# Patient Record
Sex: Female | Born: 1945
Health system: Southern US, Community
[De-identification: ages and names within clinical notes are randomized; demographics above are authoritative.]

## PROBLEM LIST (undated history)

## (undated) DIAGNOSIS — M1990 Unspecified osteoarthritis, unspecified site: Secondary | ICD-10-CM

## (undated) DIAGNOSIS — H269 Unspecified cataract: Secondary | ICD-10-CM

## (undated) DIAGNOSIS — E785 Hyperlipidemia, unspecified: Secondary | ICD-10-CM

## (undated) DIAGNOSIS — K219 Gastro-esophageal reflux disease without esophagitis: Secondary | ICD-10-CM

## (undated) DIAGNOSIS — M810 Age-related osteoporosis without current pathological fracture: Secondary | ICD-10-CM

## (undated) DIAGNOSIS — F410 Panic disorder [episodic paroxysmal anxiety] without agoraphobia: Secondary | ICD-10-CM

## (undated) DIAGNOSIS — I10 Essential (primary) hypertension: Secondary | ICD-10-CM

## (undated) DIAGNOSIS — K7460 Unspecified cirrhosis of liver: Secondary | ICD-10-CM

## (undated) HISTORY — DX: Age-related osteoporosis without current pathological fracture: M81.0

## (undated) HISTORY — PX: COLONOSCOPY: SHX174

## (undated) HISTORY — DX: Panic disorder (episodic paroxysmal anxiety): F41.0

## (undated) HISTORY — DX: Hyperlipidemia, unspecified: E78.5

## (undated) HISTORY — DX: Gastro-esophageal reflux disease without esophagitis: K21.9

## (undated) HISTORY — DX: Unspecified cataract: H26.9

## (undated) HISTORY — DX: Unspecified cirrhosis of liver: K74.60

## (undated) HISTORY — PX: OTHER SURGICAL HISTORY: SHX169

## (undated) HISTORY — DX: Essential (primary) hypertension: I10

## (undated) HISTORY — DX: Unspecified osteoarthritis, unspecified site: M19.90

---

## 1986-02-18 HISTORY — PX: TUBAL LIGATION: SHX77

## 1990-02-18 HISTORY — PX: FRACTURE SURGERY: SHX138

## 1998-08-11 ENCOUNTER — Other Ambulatory Visit: Admission: RE | Admit: 1998-08-11 | Discharge: 1998-08-11 | Payer: Self-pay | Admitting: Obstetrics & Gynecology

## 1999-09-05 ENCOUNTER — Other Ambulatory Visit: Admission: RE | Admit: 1999-09-05 | Discharge: 1999-09-05 | Payer: Self-pay | Admitting: Obstetrics and Gynecology

## 1999-09-11 ENCOUNTER — Ambulatory Visit (HOSPITAL_BASED_OUTPATIENT_CLINIC_OR_DEPARTMENT_OTHER): Admission: RE | Admit: 1999-09-11 | Discharge: 1999-09-11 | Payer: Self-pay | Admitting: Pediatrics

## 2000-09-16 ENCOUNTER — Other Ambulatory Visit: Admission: RE | Admit: 2000-09-16 | Discharge: 2000-09-16 | Payer: Self-pay | Admitting: Obstetrics & Gynecology

## 2001-04-25 ENCOUNTER — Emergency Department (HOSPITAL_COMMUNITY): Admission: EM | Admit: 2001-04-25 | Discharge: 2001-04-25 | Payer: Self-pay | Admitting: Emergency Medicine

## 2001-05-11 ENCOUNTER — Encounter: Admission: RE | Admit: 2001-05-11 | Discharge: 2001-08-09 | Payer: Self-pay | Admitting: Internal Medicine

## 2003-03-06 ENCOUNTER — Emergency Department (HOSPITAL_COMMUNITY): Admission: EM | Admit: 2003-03-06 | Discharge: 2003-03-06 | Payer: Self-pay | Admitting: Emergency Medicine

## 2003-04-15 ENCOUNTER — Other Ambulatory Visit: Admission: RE | Admit: 2003-04-15 | Discharge: 2003-04-15 | Payer: Self-pay | Admitting: Obstetrics & Gynecology

## 2003-08-12 ENCOUNTER — Emergency Department (HOSPITAL_COMMUNITY): Admission: EM | Admit: 2003-08-12 | Discharge: 2003-08-12 | Payer: Self-pay | Admitting: Emergency Medicine

## 2004-02-19 HISTORY — PX: CHOLECYSTECTOMY: SHX55

## 2004-06-12 ENCOUNTER — Ambulatory Visit (HOSPITAL_COMMUNITY): Admission: RE | Admit: 2004-06-12 | Discharge: 2004-06-12 | Payer: Self-pay | Admitting: Internal Medicine

## 2004-06-12 ENCOUNTER — Ambulatory Visit: Payer: Self-pay | Admitting: Internal Medicine

## 2004-08-11 ENCOUNTER — Emergency Department (HOSPITAL_COMMUNITY): Admission: EM | Admit: 2004-08-11 | Discharge: 2004-08-11 | Payer: Self-pay | Admitting: Emergency Medicine

## 2004-09-12 ENCOUNTER — Ambulatory Visit: Payer: Self-pay | Admitting: Internal Medicine

## 2004-09-14 ENCOUNTER — Ambulatory Visit (HOSPITAL_COMMUNITY): Admission: RE | Admit: 2004-09-14 | Discharge: 2004-09-14 | Payer: Self-pay | Admitting: Internal Medicine

## 2004-09-19 ENCOUNTER — Ambulatory Visit: Payer: Self-pay | Admitting: Internal Medicine

## 2005-01-01 ENCOUNTER — Ambulatory Visit: Payer: Self-pay | Admitting: Internal Medicine

## 2005-01-01 ENCOUNTER — Encounter (INDEPENDENT_AMBULATORY_CARE_PROVIDER_SITE_OTHER): Payer: Self-pay | Admitting: Pulmonary Disease

## 2005-03-02 ENCOUNTER — Ambulatory Visit: Payer: Self-pay | Admitting: Cardiology

## 2005-03-02 ENCOUNTER — Observation Stay (HOSPITAL_COMMUNITY): Admission: EM | Admit: 2005-03-02 | Discharge: 2005-03-04 | Payer: Self-pay | Admitting: Family Medicine

## 2005-03-11 ENCOUNTER — Encounter: Payer: Self-pay | Admitting: Cardiology

## 2005-03-11 ENCOUNTER — Ambulatory Visit: Payer: Self-pay

## 2005-03-12 ENCOUNTER — Encounter: Admission: RE | Admit: 2005-03-12 | Discharge: 2005-03-12 | Payer: Self-pay | Admitting: Internal Medicine

## 2005-03-27 ENCOUNTER — Ambulatory Visit (HOSPITAL_COMMUNITY): Admission: RE | Admit: 2005-03-27 | Discharge: 2005-03-27 | Payer: Self-pay | Admitting: *Deleted

## 2005-03-27 ENCOUNTER — Encounter (INDEPENDENT_AMBULATORY_CARE_PROVIDER_SITE_OTHER): Payer: Self-pay | Admitting: *Deleted

## 2005-03-27 ENCOUNTER — Encounter: Payer: Self-pay | Admitting: Internal Medicine

## 2005-04-23 ENCOUNTER — Other Ambulatory Visit: Admission: RE | Admit: 2005-04-23 | Discharge: 2005-04-23 | Payer: Self-pay | Admitting: Obstetrics & Gynecology

## 2005-05-07 ENCOUNTER — Ambulatory Visit (HOSPITAL_COMMUNITY): Admission: RE | Admit: 2005-05-07 | Discharge: 2005-05-08 | Payer: Self-pay | Admitting: General Surgery

## 2005-05-07 ENCOUNTER — Encounter (INDEPENDENT_AMBULATORY_CARE_PROVIDER_SITE_OTHER): Payer: Self-pay | Admitting: *Deleted

## 2005-10-01 ENCOUNTER — Ambulatory Visit (HOSPITAL_COMMUNITY): Admission: RE | Admit: 2005-10-01 | Discharge: 2005-10-01 | Payer: Self-pay | Admitting: Internal Medicine

## 2005-10-01 ENCOUNTER — Encounter (INDEPENDENT_AMBULATORY_CARE_PROVIDER_SITE_OTHER): Payer: Self-pay | Admitting: Pulmonary Disease

## 2005-12-01 LAB — HM MAMMOGRAPHY: HM Mammogram: NORMAL

## 2005-12-28 ENCOUNTER — Encounter (INDEPENDENT_AMBULATORY_CARE_PROVIDER_SITE_OTHER): Payer: Self-pay | Admitting: Pulmonary Disease

## 2005-12-28 DIAGNOSIS — K219 Gastro-esophageal reflux disease without esophagitis: Secondary | ICD-10-CM | POA: Insufficient documentation

## 2005-12-28 DIAGNOSIS — R079 Chest pain, unspecified: Secondary | ICD-10-CM | POA: Insufficient documentation

## 2006-01-01 ENCOUNTER — Encounter (INDEPENDENT_AMBULATORY_CARE_PROVIDER_SITE_OTHER): Payer: Self-pay | Admitting: Pulmonary Disease

## 2006-01-01 LAB — CONVERTED CEMR LAB

## 2006-03-12 DIAGNOSIS — E119 Type 2 diabetes mellitus without complications: Secondary | ICD-10-CM | POA: Insufficient documentation

## 2006-03-12 DIAGNOSIS — E785 Hyperlipidemia, unspecified: Secondary | ICD-10-CM

## 2006-03-12 DIAGNOSIS — E1169 Type 2 diabetes mellitus with other specified complication: Secondary | ICD-10-CM | POA: Insufficient documentation

## 2006-03-12 DIAGNOSIS — I10 Essential (primary) hypertension: Secondary | ICD-10-CM | POA: Insufficient documentation

## 2006-04-21 ENCOUNTER — Emergency Department (HOSPITAL_COMMUNITY): Admission: EM | Admit: 2006-04-21 | Discharge: 2006-04-21 | Payer: Self-pay | Admitting: Family Medicine

## 2007-07-12 ENCOUNTER — Emergency Department (HOSPITAL_COMMUNITY): Admission: EM | Admit: 2007-07-12 | Discharge: 2007-07-12 | Payer: Self-pay | Admitting: Family Medicine

## 2007-11-24 ENCOUNTER — Emergency Department (HOSPITAL_COMMUNITY): Admission: EM | Admit: 2007-11-24 | Discharge: 2007-11-24 | Payer: Self-pay | Admitting: Emergency Medicine

## 2008-05-28 LAB — HM DIABETES EYE EXAM: HM Diabetic Eye Exam: NORMAL

## 2008-07-27 ENCOUNTER — Encounter: Payer: Self-pay | Admitting: Internal Medicine

## 2008-07-29 ENCOUNTER — Encounter: Payer: Self-pay | Admitting: Internal Medicine

## 2008-08-04 ENCOUNTER — Ambulatory Visit (HOSPITAL_COMMUNITY): Admission: RE | Admit: 2008-08-04 | Discharge: 2008-08-04 | Payer: Self-pay | Admitting: Internal Medicine

## 2008-09-02 ENCOUNTER — Encounter: Payer: Self-pay | Admitting: Internal Medicine

## 2008-09-02 ENCOUNTER — Ambulatory Visit: Payer: Self-pay | Admitting: Internal Medicine

## 2008-09-02 DIAGNOSIS — J45909 Unspecified asthma, uncomplicated: Secondary | ICD-10-CM | POA: Insufficient documentation

## 2008-09-02 LAB — CONVERTED CEMR LAB
Blood Glucose, Fingerstick: 163
Creatinine, Urine: 118.9 mg/dL
Hgb A1c MFr Bld: 8.4 %
Microalb Creat Ratio: 6.3 mg/g (ref 0.0–30.0)
Microalb, Ur: 0.75 mg/dL (ref 0.00–1.89)

## 2008-09-09 ENCOUNTER — Encounter: Payer: Self-pay | Admitting: Internal Medicine

## 2008-09-16 ENCOUNTER — Encounter: Payer: Self-pay | Admitting: Internal Medicine

## 2008-09-16 ENCOUNTER — Ambulatory Visit: Payer: Self-pay | Admitting: Internal Medicine

## 2008-09-17 LAB — CONVERTED CEMR LAB
ALT: 33 units/L (ref 0–35)
AST: 27 units/L (ref 0–37)
Albumin: 4.3 g/dL (ref 3.5–5.2)
Alkaline Phosphatase: 57 units/L (ref 39–117)
BUN: 16 mg/dL (ref 6–23)
CO2: 24 meq/L (ref 19–32)
Calcium: 9.8 mg/dL (ref 8.4–10.5)
Chloride: 107 meq/L (ref 96–112)
Cholesterol: 204 mg/dL — ABNORMAL HIGH (ref 0–200)
Creatinine, Ser: 0.8 mg/dL (ref 0.40–1.20)
Glucose, Bld: 134 mg/dL — ABNORMAL HIGH (ref 70–99)
HCT: 36.3 % (ref 36.0–46.0)
HDL: 49 mg/dL (ref >39)
Hemoglobin: 11.9 g/dL — ABNORMAL LOW (ref 12.0–15.0)
LDL Cholesterol: 130 mg/dL — ABNORMAL HIGH (ref 0–99)
MCHC: 32.8 g/dL (ref 30.0–36.0)
MCV: 88.8 fL (ref >=78.0)
Platelets: 231 10*3/uL (ref 150–400)
Potassium: 4.2 meq/L (ref 3.5–5.3)
RBC: 4.09 M/uL (ref 3.87–5.11)
RDW: 14 % (ref 11.5–15.5)
Sodium: 141 meq/L (ref 135–145)
TSH: 2.513 microintl units/mL (ref 0.350–4.5)
Total Bilirubin: 0.3 mg/dL (ref 0.3–1.2)
Total CHOL/HDL Ratio: 4.2
Total Protein: 7.3 g/dL (ref 6.0–8.3)
Triglycerides: 123 mg/dL (ref <150)
VLDL: 25 mg/dL (ref 0–40)
WBC: 5.1 10*3/uL (ref 4.0–10.5)

## 2008-09-19 ENCOUNTER — Telehealth: Payer: Self-pay | Admitting: Internal Medicine

## 2008-09-27 ENCOUNTER — Telehealth: Payer: Self-pay | Admitting: *Deleted

## 2008-10-04 ENCOUNTER — Encounter: Payer: Self-pay | Admitting: Internal Medicine

## 2008-10-27 ENCOUNTER — Ambulatory Visit: Payer: Self-pay | Admitting: Internal Medicine

## 2008-10-27 LAB — CONVERTED CEMR LAB: Blood Glucose, Fingerstick: 212

## 2009-01-27 ENCOUNTER — Ambulatory Visit: Payer: Self-pay | Admitting: Infectious Diseases

## 2009-01-27 DIAGNOSIS — IMO0002 Reserved for concepts with insufficient information to code with codable children: Secondary | ICD-10-CM | POA: Insufficient documentation

## 2009-02-06 ENCOUNTER — Telehealth: Payer: Self-pay | Admitting: Internal Medicine

## 2009-03-22 ENCOUNTER — Telehealth: Payer: Self-pay | Admitting: Internal Medicine

## 2009-03-27 ENCOUNTER — Encounter: Payer: Self-pay | Admitting: Internal Medicine

## 2009-04-27 ENCOUNTER — Telehealth: Payer: Self-pay | Admitting: Internal Medicine

## 2009-05-01 ENCOUNTER — Telehealth: Payer: Self-pay | Admitting: Internal Medicine

## 2009-05-08 ENCOUNTER — Telehealth (INDEPENDENT_AMBULATORY_CARE_PROVIDER_SITE_OTHER): Payer: Self-pay | Admitting: *Deleted

## 2009-05-09 ENCOUNTER — Ambulatory Visit: Payer: Self-pay | Admitting: Internal Medicine

## 2009-05-09 LAB — CONVERTED CEMR LAB
Blood Glucose, Fingerstick: 215
Hgb A1c MFr Bld: 9.4 %

## 2009-05-15 ENCOUNTER — Ambulatory Visit: Payer: Self-pay | Admitting: Infectious Diseases

## 2009-05-15 LAB — CONVERTED CEMR LAB: Blood Glucose, Home Monitor: 1 mg/dL

## 2009-05-18 ENCOUNTER — Telehealth (INDEPENDENT_AMBULATORY_CARE_PROVIDER_SITE_OTHER): Payer: Self-pay | Admitting: *Deleted

## 2009-06-12 ENCOUNTER — Ambulatory Visit: Payer: Self-pay | Admitting: Internal Medicine

## 2009-06-12 LAB — HM DIABETES FOOT EXAM

## 2009-06-21 ENCOUNTER — Telehealth: Payer: Self-pay | Admitting: Internal Medicine

## 2009-06-22 ENCOUNTER — Ambulatory Visit: Payer: Self-pay | Admitting: Internal Medicine

## 2009-06-22 DIAGNOSIS — J40 Bronchitis, not specified as acute or chronic: Secondary | ICD-10-CM

## 2009-06-22 DIAGNOSIS — J209 Acute bronchitis, unspecified: Secondary | ICD-10-CM | POA: Insufficient documentation

## 2009-06-22 HISTORY — DX: Bronchitis, not specified as acute or chronic: J40

## 2009-06-22 LAB — CONVERTED CEMR LAB: Blood Glucose, Fingerstick: 251

## 2009-06-30 ENCOUNTER — Telehealth (INDEPENDENT_AMBULATORY_CARE_PROVIDER_SITE_OTHER): Payer: Self-pay | Admitting: *Deleted

## 2009-07-05 ENCOUNTER — Telehealth: Payer: Self-pay | Admitting: *Deleted

## 2009-07-10 ENCOUNTER — Telehealth: Payer: Self-pay | Admitting: Internal Medicine

## 2009-07-19 ENCOUNTER — Telehealth: Payer: Self-pay | Admitting: Internal Medicine

## 2009-08-08 ENCOUNTER — Telehealth: Payer: Self-pay | Admitting: Internal Medicine

## 2009-11-16 ENCOUNTER — Telehealth: Payer: Self-pay | Admitting: Internal Medicine

## 2009-11-24 ENCOUNTER — Encounter: Admission: RE | Admit: 2009-11-24 | Discharge: 2009-11-24 | Payer: Self-pay | Admitting: Internal Medicine

## 2010-03-11 ENCOUNTER — Encounter: Payer: Self-pay | Admitting: Internal Medicine

## 2010-03-19 ENCOUNTER — Telehealth (INDEPENDENT_AMBULATORY_CARE_PROVIDER_SITE_OTHER): Payer: Self-pay | Admitting: *Deleted

## 2010-03-20 NOTE — Miscellaneous (Signed)
Summary: vaccine record  vaccine record   Imported By: Harrison Mons Steps 02/24/2006 17:58:16  _____________________________________________________________________  External Attachment:    Type:   Image     Comment:   External Document

## 2010-03-20 NOTE — Progress Notes (Signed)
Summary: congestion/ hla  Phone Note Call from Patient   Summary of Call: pt calls c/o congestion and sinus pain x 1 wk, short of breath at times, using inhaler helps somewhat nothing seems to make worse. denies fever. able to take food and fluid w/o difficulty. desires appt, given for 5/5. pt is instructed to go to ed if condition becomes worse. she is agreeable Initial call taken by: Marin Roberts RN,  Jun 21, 2009 3:37 PM  Follow-up for Phone Call        OK..Will follow up. Follow-up by: Blondell Reveal MD,  Jun 22, 2009 8:42 AM

## 2010-03-20 NOTE — Assessment & Plan Note (Signed)
Summary: ACUTE-RIGHT FOOT/BIG TOE HAS A LITTLE SPLIT ON IT/CFB(KARIMOV...   Vital Signs:  Patient profile:   65 year old female Height:      68 inches (172.72 cm) Weight:      1880.7 pounds (82.41 kg) BMI:     286.99 Temp:     97.1 degrees F (36.17 degrees C) oral Pulse (ortho):   87 / minute BP sitting:   136 / 89  (right arm) Cuff size:   regular  Vitals Entered By: Theotis Barrio NT II (January 27, 2009 3:54 PM) CC: SPOT ON RIGHT GREAT TOE   / DM Is Patient Diabetic? Yes Did you bring your meter with you today? No Pain Assessment Patient in pain? no       Have you ever been in a relationship where you felt threatened, hurt or afraid?No   Does patient need assistance? Functional Status Self care Ambulation Normal Comments DM / SPOT ON RIGHT GREAT TOE  / DM   Primary Care Provider:  Deatra Robinson MD  CC:  SPOT ON RIGHT GREAT TOE   / DM.  History of Present Illness: 65 yrs old female with HTN and DM here for visit after noticing blister in her great toe on left feet. She does not report any discharge, local swelling, pain.  She did not tolerate high dose of metformin = she felt dizzy and grogy. She went back to her previous dose and done well since. She has no other complain.   Preventive Screening-Counseling & Management  Alcohol-Tobacco     Alcohol drinks/day: 0     Smoking Status: quit     Smoking Cessation Counseling: yes     Packs/Day: 4 cig/week     Year Quit: 1970  Caffeine-Diet-Exercise     Caffeine use/day: cups of coffee     Nutrition Referrals: yes     Does Patient Exercise: yes     Type of exercise: walking     Exercise (avg: min/session): 30-60     Times/week: 5  Current Medications (verified): 1)  Proventil Hfa 108 (90 Base) Mcg/act Aers (Albuterol Sulfate) .... Qid As Needed 2)  Advair Hfa 45-21 Mcg/act Aero (Fluticasone-Salmeterol) .... One Puff Daily 3)  Metformin Hcl 500 Mg Tabs (Metformin Hcl) .... Take 1 Tablet By Mouth Two Times A  Day 4)  Aspir-Low 81 Mg Tbec (Aspirin) .... Take 1 Tablet By Mouth Once A Day With Meals For Heart Protection 5)  Onetouch Ultra System W/device Kit (Blood Glucose Monitoring Suppl) .... Check Blood Sugar Twice A Day Before Meals 6)  Glimepiride 4 Mg Tabs (Glimepiride) .... Take 1 Tablet By Mouth Two Times A Day 7)  Cvs Omeprazole 20 Mg Tbec (Omeprazole) .... Take 1 Tablet By Mouth Two Times A Day Before Meals 8)  Hydrochlorothiazide 25 Mg Tabs (Hydrochlorothiazide) .... Take 1 Tablet By Mouth Once A Day in Am 9)  Pravastatin Sodium 20 Mg Tabs (Pravastatin Sodium) .... Take 1 Tablet By Mouth Once A Day  Allergies (verified): No Known Drug Allergies  Past History:  Past Medical History: Last updated: September 19, 2008 GERD-remote history/ add protonix for cough. Chest pain- normal EKG Asthma, moderate Diabetes mellitus, type II Hypertension Hyperlipidemia  Past Surgical History: Last updated: 2008/09/19 Cholecystectomy in 2006 BTL in 1988 C-section in 1988 L ankle ORIF in 1992  Family History: Last updated: 09-19-2008 Father died from CAD at age of 26 y/o Mother died from Lung cancer at age 40 y/o  Social History: Last updated: 09/16/2008 Married, 5  children Unemployed Uninsured --does not qualify for an "[employer]' benefits. Education: 1 year of college ETOH: no Nikotine: 5 cig/week for 1 year. Quit in 1970 Drugs: no  Risk Factors: Alcohol Use: 0 (01/27/2009) Caffeine Use: cups of coffee (01/27/2009) Exercise: yes (01/27/2009)  Risk Factors: Smoking Status: quit (01/27/2009) Packs/Day: 4 cig/week (01/27/2009)  Review of Systems      See HPI  Physical Exam  General:  alert.   Head:  no abnormalities observed.   Eyes:  pupils equal, pupils round, pupils reactive to light, and pupils react to accomodation.   Ears:  R ear normal and L ear normal.   Nose:  no external deformity, no airflow obstruction, no intranasal foreign body, nasal discharge, mucosal erythema,  and mucosal edema.   Mouth:  pharyngeal erythema.   Neck:  supple and no masses.   Lungs:  normal respiratory effort and normal breath sounds.   Heart:  normal rate, regular rhythm, and no murmur.   Abdomen:  soft and normal bowel sounds.   Msk:  No deformity or scoliosis noted of thoracic or lumbar spine.   Extremities:  small area of skin denudation without any discharge on right great toe base.  Neurologic:  No cranial nerve deficits noted. Station and gait are normal. Plantar reflexes are down-going bilaterally. DTRs are symmetrical throughout. Sensory, motor and coordinative functions appear intact. Psych:  Cognition and judgment appear intact. Alert and cooperative with normal attention span and concentration. No apparent delusions, illusions, hallucinations   Impression & Recommendations:  Problem # 1:  FOOT&TOE BLISTER WITHOUT MENTION OF INFECTION (ICD-917.2) advised moisture keeping socks, regular moisturiser and petroleum jelly use. Appears non infected therefore no antibiotics given.  She started using new shoes, which may not have enough area for toe accomodation and result into abrasion. I advised her on proper foot ware.   Problem # 2:  HYPERLIPIDEMIA (ICD-272.4) stable.no changes made at this time.  Her updated medication list for this problem includes:    Pravastatin Sodium 20 Mg Tabs (Pravastatin sodium) .Marland Kitchen... Take 1 tablet by mouth once a day  Labs Reviewed: SGOT: 27 (09/16/2008)   SGPT: 33 (09/16/2008)   HDL:49 (09/16/2008)  LDL:130 (09/16/2008)  Chol:204 (09/16/2008)  Trig:123 (09/16/2008)  Problem # 3:  DIABETES MELLITUS, TYPE II (ICD-250.00) could not tolerate metformin 850. NOw tolerating 500 two times a day. May need to go on insulin. Discussed this in detail with her and she is NOT resistant to going on insulin At this time we have set up goal to have HbA1C less then 7 or daily CBG <150 by Jan end. Otherwise, she is ready to go insulin. I will flag her PCP for the  same.  p. s. she could not tolerate higher dose metformin and went to lower dose.  Her updated medication list for this problem includes:    Metformin Hcl 500 Mg Tabs (Metformin hcl) .Marland Kitchen... Take 1 tablet by mouth two times a day    Aspir-low 81 Mg Tbec (Aspirin) .Marland Kitchen... Take 1 tablet by mouth once a day with meals for heart protection    Glimepiride 4 Mg Tabs (Glimepiride) .Marland Kitchen... Take 1 tablet by mouth two times a day  Orders: T- Capillary Blood Glucose ([address]) T-Hgb A1C (in-house) (60454UJ)  Labs Reviewed: Creat: 0.80 (09/16/2008)     Last Eye Exam: normal (06/03/2008) Reviewed HgBA1c results: 8.4 (09/02/2008)  Complete Medication List: 1)  Proventil Hfa 108 (90 Base) Mcg/act Aers (Albuterol sulfate) .... Qid as needed 2)  Advair  Hfa 45-21 Mcg/act Aero (Fluticasone-salmeterol) .... One puff daily 3)  Metformin Hcl 500 Mg Tabs (Metformin hcl) .... Take 1 tablet by mouth two times a day 4)  Aspir-low 81 Mg Tbec (Aspirin) .... Take 1 tablet by mouth once a day with meals for heart protection 5)  Onetouch Ultra System W/device Kit (Blood glucose monitoring suppl) .... Check blood sugar twice a day before meals 6)  Glimepiride 4 Mg Tabs (Glimepiride) .... Take 1 tablet by mouth two times a day 7)  Cvs Omeprazole 20 Mg Tbec (Omeprazole) .... Take 1 tablet by mouth two times a day before meals 8)  Hydrochlorothiazide 25 Mg Tabs (Hydrochlorothiazide) .... Take 1 tablet by mouth once a day in am 9)  Pravastatin Sodium 20 Mg Tabs (Pravastatin sodium) .... Take 1 tablet by mouth once a day  Patient Instructions: 1)  Proper shoe wear, moisturiser and petroleum gel application with moisture keeping socks are key to your foot health.  2)  Diet exercise and life style changes can help control diabetes. If you dont acheive your goals by January we will consider starting you on insulin. Prescriptions: METFORMIN HCL 500 MG TABS (METFORMIN HCL) Take 1 tablet by mouth two times a day  #60 x 0   Entered and  Authorized by:   Clerance Lav MD   Signed by:   Clerance Lav MD on 01/27/2009   Method used:   Electronically to        [facility] (retail)       [address]       [place], [place]  [address]       Ph: [phone]       Fax: [phone]   RxID:   [id number]    Prevention & Chronic Care Immunizations   Influenza vaccine: Not documented   Influenza vaccine deferral: Deferred  (09/16/2008)    Tetanus booster: 09/02/2008: Td   Tetanus booster due: 09/03/2018    Pneumococcal vaccine: Not documented    H. zoster vaccine: Not documented   H. zoster vaccine deferral: Deferred  (09/16/2008)  Colorectal Screening   Hemoccult: Not documented   Hemoccult action/deferral: Ordered  (09/02/2008)    Colonoscopy: Not documented   Colonoscopy action/deferral: Deferred  (09/16/2008)  Other Screening   Pap smear: Refused  (01/01/2006)   Pap smear action/deferral: Refused  (09/16/2008)   Pap smear due: 09/03/2011    Mammogram: Normal  (12/01/2005)   Mammogram action/deferral: Ordered  (09/02/2008)   Mammogram due: 12/02/2006    DXA bone density scan: Not documented   DXA bone density action/deferral: Ordered  (09/02/2008)   DXA scan due: 08/2010    Smoking status: quit  (01/27/2009)  Diabetes Mellitus   HgbA1C: 8.4  (09/02/2008)    Eye exam: normal  (06/03/2008)   Diabetic eye exam action/deferral: Not indicated  (10/27/2008)   Eye exam due: 06/2009    Foot exam: yes  (09/02/2008)   Foot exam action/deferral: Do today   High risk foot: Not documented   Foot care education: Done  (09/02/2008)    Urine microalbumin/creatinine ratio: 6.3  (09/02/2008)    Diabetes flowsheet reviewed?: Yes   Progress toward A1C goal: Unchanged  Lipids   Total Cholesterol: 204  (09/16/2008)   Lipid panel action/deferral: Lipid Panel ordered   LDL: 130  (09/16/2008)   LDL Direct: Not documented   HDL: 49  (09/16/2008)   Triglycerides: 123   (09/16/2008)  SGOT (AST): 27  (09/16/2008)   BMP action: Ordered   SGPT (ALT): 33  (09/16/2008)   Alkaline phosphatase: 57  (09/16/2008)   Total bilirubin: 0.3  (09/16/2008)    Lipid flowsheet reviewed?: Yes   Progress toward LDL goal: At goal  Hypertension   Last Blood Pressure: 136 / 89  (01/27/2009)   Serum creatinine: 0.80  (09/16/2008)   BMP action: Ordered   Serum potassium 4.2  (09/16/2008)   Basic metabolic panel due: 10/17/2008    Hypertension flowsheet reviewed?: Yes   Progress toward BP goal: At goal  Self-Management Support :   Personal Goals (by the next clinic visit) :     Personal A1C goal: 7  (09/16/2008)     Personal blood pressure goal: 130/80  (09/16/2008)     Personal LDL goal: 100  (09/16/2008)    Patient will work on the following items until the next clinic visit to reach self-care goals:     Medications and monitoring: check my blood sugar, check my blood pressure  (01/27/2009)     Eating: use fresh or frozen vegetables, eat foods that are low in salt, eat baked foods instead of fried foods  (01/27/2009)     Activity: take a 30 minute walk every day  (09/16/2008)     Other: attend a self-management class  (09/16/2008)     Home glucose monitoring frequency: 1 time daily  (09/16/2008)    Diabetes self-management support: Written self-care plan  (01/27/2009)   Diabetes care plan printed    Hypertension self-management support: Written self-care plan  (01/27/2009)   Hypertension self-care plan printed.    Lipid self-management support: Written self-care plan  (01/27/2009)   Lipid self-care plan printed.

## 2010-03-20 NOTE — Progress Notes (Signed)
Summary: refill/gg  Phone Note Refill Request  on April 27, 2009 11:07 AM  Refills Requested: Medication #1:  METFORMIN HCL 500 MG TABS Take 1 tablet by mouth two times a day   Last Refilled: 03/22/2009  Method Requested: Electronic Initial call taken by: Merrie Roof RN,  April 27, 2009 11:08 AM  Follow-up for Phone Call        Rx denied because needs a follow up office visit. Follow-up by: Deatra Robinson, MD     Appended Document: refill/gg P{t has a scheduled appointment with you on 4/25.  Will you give her enough meds to last until then?

## 2010-03-20 NOTE — Miscellaneous (Signed)
Summary: HIPAA Restrictions  HIPAA Restrictions   Imported By: Florinda Marker 09/05/2008 15:53:26  _____________________________________________________________________  External Attachment:    Type:   Image     Comment:   External Document

## 2010-03-20 NOTE — Progress Notes (Signed)
Summary: med refill/gp  Phone Note Refill Request Message from:  Fax from Pharmacy on August 08, 2009 11:10 AM  Refills Requested: Medication #1:  GLIMEPIRIDE 4 MG TABS Take 1 tablet by mouth two times a day [BMN]   Last Refilled: 07/05/2009 Last appt.06/22/09.   Method Requested: Electronic Initial call taken by: Chinita Pester RN,  August 08, 2009 11:10 AM  Follow-up for Phone Call        Refill approved-nurse to complete Follow-up by: Deatra Robinson MD,  August 08, 2009 6:59 PM    Prescriptions: GLIMEPIRIDE 4 MG TABS (GLIMEPIRIDE) Take 1 tablet by mouth two times a day Brand medically necessary #60 x 11   Entered and Authorized by:   Deatra Robinson MD   Signed by:   Deatra Robinson MD on 08/08/2009   Method used:   Electronically to        [facility] (retail)       [address]       [place], [place]  [address]       Ph: [phone]       Fax: [phone]   RxID:   [id number]

## 2010-03-20 NOTE — Progress Notes (Signed)
Summary: refill/gg  Phone Note Refill Request  on Jul 10, 2009 10:13 AM  Refills Requested: Medication #1:  LANTUS 100 UNIT/ML SOLN Take 14 units at bedtime ***Pt now wants lantus solostar pen....please change.  Insurance will pay for pens. call when done [phone]   Method Requested: Electronic Initial call taken by: Merrie Roof RN,  Jul 10, 2009 10:13 AM  Follow-up for Phone Call        Rx completed in Dr. Tiajuana Amass Follow-up by: Deatra Robinson MD,  Jul 11, 2009 3:57 PM  Additional Follow-up for Phone Call Additional follow up Details #1::        Rx faxed to pharmacy    Prescriptions: LANTUS 100 UNIT/ML SOLN (INSULIN GLARGINE) Take 14 units at bedtime  #1 x 11   Entered and Authorized by:   Deatra Robinson MD   Signed by:   Deatra Robinson MD on 07/18/2009   Method used:   Electronically to        [facility] (retail)       [address]       [place], [place]  [address]       Ph: [phone]       Fax: [phone]   RxID:   [id number]

## 2010-03-20 NOTE — Assessment & Plan Note (Signed)
Summary: EST-CK/FU/MEDS/CFB   Vital Signs:  Patient profile:   65 year old female Weight:      179.2 pounds (81.45 kg) Temp:     97.1 degrees F (36.17 degrees C) oral Pulse rate:   96 / minute BP sitting:   145 / 93  (right arm) Cuff size:   regular  Vitals Entered By: Cynda Familia Duncan Dull) (June 12, 2009 4:24 PM) CC: f/u after starting lantus Is Patient Diabetic? Yes Did you bring your meter with you today? Yes-pt brings in 2 different meters today, one with older values that she states is not hers  Have you ever been in a relationship where you felt threatened, hurt or afraid?No   Does patient need assistance? Functional Status Self care Ambulation Normal   CC:  f/u after starting lantus.  Preventive Screening-Counseling & Management  Alcohol-Tobacco     Alcohol drinks/day: 0     Smoking Status: quit     Smoking Cessation Counseling: yes     Packs/Day: 4 cig/week     Year Quit: 1970  Allergies: No Known Drug Allergies  Physical Exam  General:  Well-developed,well-nourished,in no acute distress; alert,appropriate and cooperative throughout examination Head:  no abnormalities observed.   Eyes:  anicteric, PERRL.  Ears:  R ear normal and L ear normal.   Nose:  no external deformity, no airflow obstruction, no intranasal foreign body, nasal discharge, mucosal erythema, and mucosal edema.   Mouth:  MMM Neck:  supple and no masses.   Lungs:  Normal respiratory effort, chest expands symmetrically. Lungs are clear to auscultation, no crackles or wheezes. Heart:  Normal rate and regular rhythm. S1 and S2 normal without gallop, murmur, click, rub or other extra sounds. Abdomen:  +BS's, soft, NT and ND.   Msk:  No deformity or scoliosis noted of thoracic or lumbar spine.   Pulses:  Pedal pulses 2+/4 bilaterally Extremities:  no peripheral edema.  Neurologic:  gait is normal.  Diabetes Management Exam:    Foot Exam (with socks and/or shoes not present):    Sensory-Pinprick/Light touch:          Left medial foot (L-4): normal          Left dorsal foot (L-5): normal          Left lateral foot (S-1): normal          Right medial foot (L-4): normal          Right dorsal foot (L-5): normal          Right lateral foot (S-1): normal       Sensory-Monofilament:          Left foot: normal          Right foot: normal       Inspection:          Left foot: normal          Right foot: normal       Nails:          Left foot: normal          Right foot: normal   Impression & Recommendations:  Problem # 1:  DIABETES MELLITUS, TYPE II (ICD-250.00) Diet, exercise discussed. Continue to f/u with Ms. Rhiley with DME. Her updated medication list for this problem includes:    Metformin Hcl 1000 Mg Tabs (Metformin hcl) .Marland Kitchen... Take 1 tablet by mouth two times a day with meals    Aspir-low 81 Mg Tbec (Aspirin) .Marland KitchenMarland KitchenMarland KitchenMarland Kitchen  Take 1 tablet by mouth once a day with meals for heart protection    Glimepiride 4 Mg Tabs (Glimepiride) .Marland Kitchen... Take 1 tablet by mouth two times a day    Lantus 100 Unit/ml Soln (Insulin glargine) .Marland Kitchen... Take 14 units at bedtime  Problem # 2:  HYPERTENSION, ESSENTIAL NOS (ICD-401.9)  Her updated medication list for this problem includes:    Hydrochlorothiazide 25 Mg Tabs (Hydrochlorothiazide) .Marland Kitchen... Take 1 tablet by mouth once a day in am  BP today: 145/93 Prior BP: 132/95 (05/09/2009)  Labs Reviewed: K+: 4.2 (09/16/2008) Creat: : 0.80 (09/16/2008)   Chol: 204 (09/16/2008)   HDL: 49 (09/16/2008)   LDL: 130 (09/16/2008)   TG: 123 (09/16/2008)  Complete Medication List: 1)  Proventil Hfa 108 (90 Base) Mcg/act Aers (Albuterol sulfate) .... Qid as needed 2)  Metformin Hcl 1000 Mg Tabs (Metformin hcl) .... Take 1 tablet by mouth two times a day with meals 3)  Aspir-low 81 Mg Tbec (Aspirin) .... Take 1 tablet by mouth once a day with meals for heart protection 4)  Onetouch Ultra System W/device Kit (Blood glucose monitoring suppl) .... Check  blood sugar twice a day before meals 5)  Glimepiride 4 Mg Tabs (Glimepiride) .... Take 1 tablet by mouth two times a day 6)  Hydrochlorothiazide 25 Mg Tabs (Hydrochlorothiazide) .... Take 1 tablet by mouth once a day in am 7)  Pravastatin Sodium 20 Mg Tabs (Pravastatin sodium) .... Take 1 tab by mouth at bedtime 8)  Lantus 100 Unit/ml Soln (Insulin glargine) .... Take 14 units at bedtime  Other Orders: T-Hemoccult Card-Multiple (take home) ([address]) Mammogram (Screening) (Mammo) Prescriptions: METFORMIN HCL 1000 MG TABS (METFORMIN HCL) Take 1 tablet by mouth two times a day with meals  #60 x 3   Entered and Authorized by:   Deatra Robinson MD   Signed by:   Deatra Robinson MD on 06/12/2009   Method used:   Electronically to        [facility] (retail)       [address]       [place], [place]  [address]       Ph: [phone]       Fax: [phone]   RxID:   [id number]    Prevention & Chronic Care Immunizations   Influenza vaccine: Not documented   Influenza vaccine deferral: Deferred  (09/16/2008)   Influenza vaccine due: 10/19/2009    Tetanus booster: 09/02/2008: Td   Tetanus booster due: 09/03/2018    Pneumococcal vaccine: Not documented   Pneumococcal vaccine deferral: Not indicated  (06/12/2009)    H. zoster vaccine: Not documented   H. zoster vaccine deferral: Not available  (06/12/2009)  Colorectal Screening   Hemoccult: Not documented   Hemoccult action/deferral: Ordered  (06/12/2009)    Colonoscopy: Not documented   Colonoscopy action/deferral: Refused  (06/12/2009)  Other Screening   Pap smear: Refused  (01/01/2006)   Pap smear action/deferral: Refused  (09/16/2008)   Pap smear due: 09/03/2011    Mammogram: Normal  (12/01/2005)   Mammogram action/deferral: Ordered  (06/12/2009)   Mammogram due: 12/02/2006    DXA bone density scan: Not documented   DXA bone density action/deferral: Ordered  (09/02/2008)   DXA scan  due: 08/2010    Smoking status: quit  (06/12/2009)  Diabetes Mellitus   HgbA1C: 9.4  (05/09/2009)    Eye exam: normal  (06/03/2008)   Diabetic eye exam action/deferral: Not indicated  (  10/27/2008)   Eye exam due: 06/2009    Foot exam: yes  (06/12/2009)   Foot exam action/deferral: Do today   High risk foot: Not documented   Foot care education: Done  (09/02/2008)    Urine microalbumin/creatinine ratio: 6.3  (09/02/2008)  Lipids   Total Cholesterol: 204  (09/16/2008)   Lipid panel action/deferral: Lipid Panel ordered   LDL: 130  (09/16/2008)   LDL Direct: Not documented   HDL: 49  (09/16/2008)   Triglycerides: 123  (09/16/2008)    SGOT (AST): 27  (09/16/2008)   BMP action: Ordered   SGPT (ALT): 33  (09/16/2008)   Alkaline phosphatase: 57  (09/16/2008)   Total bilirubin: 0.3  (09/16/2008)    Lipid flowsheet reviewed?: Yes   Progress toward LDL goal: Unchanged    Stage of readiness to change (lipid management): Maintenance  Hypertension   Last Blood Pressure: 145 / 93  (06/12/2009)   Serum creatinine: 0.80  (09/16/2008)   BMP action: Ordered   Serum potassium 4.2  (09/16/2008)   Basic metabolic panel due: 10/17/2008    Hypertension flowsheet reviewed?: Yes   Progress toward BP goal: Deteriorated    Stage of readiness to change (hypertension management): Maintenance  Self-Management Support :   Personal Goals (by the next clinic visit) :     Personal A1C goal: 7  (09/16/2008)     Personal blood pressure goal: 130/80  (09/16/2008)     Personal LDL goal: 100  (09/16/2008)    Patient will work on the following items until the next clinic visit to reach self-care goals:     Medications and monitoring: take my medicines every day, check my blood sugar, check my blood pressure, bring all of my medications to every visit, examine my feet every day  (06/12/2009)     Eating: drink diet soda or water instead of juice or soda, eat more vegetables, use fresh or frozen  vegetables, eat foods that are low in salt, eat baked foods instead of fried foods, eat fruit for snacks and desserts, limit or avoid alcohol  (05/09/2009)     Activity: take a 30 minute walk every day, take the stairs instead of the elevator, park at the far end of the parking lot  (05/09/2009)     Other: attend a self-management class  (09/16/2008)     Home glucose monitoring frequency: 1 time daily  (09/16/2008)    Diabetes self-management support: Written self-care plan  (06/12/2009)   Diabetes care plan printed   Last diabetes self-management training by diabetes educator: 05/15/2009    Hypertension self-management support: Written self-care plan  (06/12/2009)   Hypertension self-care plan printed.    Lipid self-management support: Written self-care plan  (06/12/2009)   Lipid self-care plan printed.   Nursing Instructions: Provide Hemoccult cards with instructions (see order) Schedule screening mammogram (see order)

## 2010-03-20 NOTE — Progress Notes (Signed)
Summary: refill/gg  Phone Note Refill Request  on July 19, 2009 10:17 AM  ****Please read my note,  pt wants lantus solostar PEN's   Method Requested: Electronic Initial call taken by: Merrie Roof RN,  July 19, 2009 10:17 AM    New/Updated Medications: LANTUS SOLOSTAR 100 UNIT/ML SOLN (INSULIN GLARGINE) Inject 14 Units Subcutaneously qhs Prescriptions: LANTUS SOLOSTAR 100 UNIT/ML SOLN (INSULIN GLARGINE) Inject 14 Units Subcutaneously qhs  #1 x 5   Entered and Authorized by:   Deatra Robinson MD   Signed by:   Deatra Robinson MD on 07/19/2009   Method used:   Electronically to        [facility] (retail)       [address]       [place], [place]  [address]       Ph: [phone]       Fax: [phone]   RxID:   [id number]

## 2010-03-20 NOTE — Assessment & Plan Note (Signed)
Summary: DM TRAINING/VS   Allergies: No Known Drug Allergies   Complete Medication List: 1)  Proventil Hfa 108 (90 Base) Mcg/act Aers (Albuterol sulfate) .... Qid as needed 2)  Metformin Hcl 500 Mg Tabs (Metformin hcl) .... Take 1 tablet by mouth twice a day. 3)  Aspir-low 81 Mg Tbec (Aspirin) .... Take 1 tablet by mouth once a day with meals for heart protection 4)  Onetouch Ultra System W/device Kit (Blood glucose monitoring suppl) .... Check blood sugar twice a day before meals 5)  Glimepiride 4 Mg Tabs (Glimepiride) .... Take 1 tablet by mouth two times a day 6)  Hydrochlorothiazide 25 Mg Tabs (Hydrochlorothiazide) .... Take 1 tablet by mouth once a day in am 7)  Pravastatin Sodium 20 Mg Tabs (Pravastatin sodium) .... Take 1 tab by mouth at bedtime 8)  Lantus 100 Unit/ml Soln (Insulin glargine) .... Take 8 units at bedtime for 3 nights and then increase 1 unit each night until target fasting blood sugar reached for 2 out of 3 readings  Other Orders: DSMT(Medicare) Individual, 30 Minutes (G6440)  Diabetes Self Management Training  PCP: Deatra Robinson MD Date diagnosed with diabetes: 02/19/2004 Diabetes Type: Type 2 insulin Other persons present: Spouse Current smoking Status: quit  Assessment Daily activities: stopped keeping grandchild so started walking and is sore Sources of Support: husband here and very supportive,  Knowledge Deficits: has some basic diabetes knowledge, but needs confidence and review to pul it together for her to actively self manage Special needs or Barriers: self pay may be a barrier  Potential Barriers  Economic/Supplies  [place] strain  Diabetes Medications:  Comments: educated patient and husband on action,storage and use of lantus insulin pen, sample given per Dr. Andrey Campanile and attending physician today with starter sample of pen needles. Patient verbalized understanding of self- titration, cost of insulin and supplies, and how to seek patient  assistance with cost. patient reports lowest CBG has been 187 today and that is the lowest she has been able to get it.  Long Acting  Insulin Type:Lantus  Bedtime Dose: 8 units    Monitoring Self monitoring blood glucose 1 time a day  Time of Testing  Before Breakfast  Recent Episodes of: Requiring Help from another person  Hyperglycemia : Yes      Nutrition assessment Weight change: Loss Amount of change: 5 pounds recently by tryng to cut back Do you read food labels?                                                                          Yes What do you look at?  carbs, but wasn't sure how to quantify or know portion sizes  Activity Limitations  Inadequate physical activity  Appropriate physical activity Length of time: recently started walking again Diabetes Disease Process  Discussed today Define diabetes in simple terms: Needs review/assistanceState own type of diabetes: Demonstrates competencyState diabetes is treated by meal plan-exercise-medication-monitoring-education: Demonstrates competency Medications State name-action-dose-duration-side effects-and time to take medication: Demonstrates competency   State appropriate timing of food related to medication: Demonstrates competency   Demonstrates/verbalizes site selection and rotation for injections Demonstrates competency   Correctly draw up and administer insulin-Byetta-Symlin-glucagon: Demonstrates competency    Nutritional Management Identify what foods most often affect blood glucose: Demonstrates competency    Verbalize importance of controlling food portions: Needs review/assistance   State importance of spacing and not omitting meals and snacks: Needs review/assistance    Monitoring State purpose and frequency of monitoring BG-ketones-HgbA1C  : Needs review/assistance   Perform glucose  monitoring/ketone testing and record results correctly: Demonstrates competencyState target blood glucose and HgbA1C goals: Needs review/assistance    Complications State the causes-signs and symptoms and prevention of Hyperglycemia: Needs review/assistance   Explain proper treatment of hyperglycemia: Needs review/assistance   State the causes- signs and symptoms and prevention of hypoglycemia: Needs review/assistanceExplain proper treatment of hypoglycemia: Needs review/assistance Exercise Diabetes Management Education Done: 05/15/2009    BEHAVIORAL GOALS INITIAL Utilizing medications if for therapeutic effectiveness: inject insulin tonight and for the next 3 nights same amount, then begin increasing dose by 1 unit each night.        Diabetes Self Management Support:husbnad is very supportive and clinic staff Follow-up:is not insured or have [place] coverage, so CDE visits are an additional charge If patient desires- she could follow-up her Diabetes Training with pharm D at [facility]

## 2010-03-20 NOTE — Progress Notes (Signed)
Summary: diabetes support/dmr  Phone Note Call from Patient Call back at Home Phone [phone]   Caller: Patient Summary of Call: started insulin this week, doesn't feel bad, but sugar went dropped lower than she expected. From 150  this am to 115mg  at lunch around noon.  returned call: encouraing her to continue insulin and not to overtreat symptoms of low, but to take some carbohydrate.  Initial call taken by: Jamison Neighbor RD,CDE,  May 18, 2009 3:23 PM

## 2010-03-20 NOTE — Progress Notes (Signed)
Summary: High sugars, dizzy  Phone Note Call from Patient   Caller: Patient Call For: Deatra Robinson MD Summary of Call: Call from pt says that her sugars are in the 200's.  For 2 days pain in left temple.  Feeling dizzy since yesterday.  Felt that something is pulling to the left side.  Took sugar at bedtime was 156.  This morning it was 213.  Feeling dizzy when she gets up or looks down and gets back.  Has had some sinus drainage.  Has some sinus drainage down her throat.  No fevers.  No chills.  Some nausea yesterday from head spinning. Angelina Ok RN  February 06, 2009 9:13 AM  Initial call taken by: Angelina Ok RN,  February 06, 2009 9:13 AM  Follow-up for Phone Call        Called Ms. Mikulski to clarify.  She states that she has had some dizziness with the "room spinning" this morning when she got up out of bed and sat up after bending over to tie her shoe.  The vertigo symptoms lasted 10-15 seconds each time and resolved spontaneously.  She has had no other symptoms of dizziness or vertigo.  She notes a new sinus infection for which she is taking sudifed.  She admits to not following her diet and exercise plan recently.  She also admits to having dry mucous membranes and skin.  She feels she may be dehydrated and I would agree with this assessment given what I have been told.  This does not sound to be a labrynthitis or BPPV.  She will try to hydrate herself today to see if symptoms improve.  If not, she will call for further evaluation and the need for a possible appointment. Follow-up by: Doneen Poisson MD,  February 06, 2009 9:45 AM

## 2010-03-20 NOTE — Progress Notes (Signed)
Summary: refill/gg  Phone Note Refill Request  on November 16, 2009 2:05 PM  Refills Requested: Medication #1:  HYDROCHLOROTHIAZIDE 25 MG TABS Take 1 tablet by mouth once a day in am   Last Refilled: 10/16/2009  Method Requested: Electronic Initial call taken by: Merrie Roof RN,  November 16, 2009 2:05 PM  Follow-up for Phone Call        Rx completed in Dr. Tiajuana Amass Follow-up by: Deatra Robinson MD,  November 16, 2009 8:23 PM    Prescriptions: LANTUS SOLOSTAR 100 UNIT/ML SOLN (INSULIN GLARGINE) Inject 14 Units Subcutaneously qhs  #1 x 11   Entered and Authorized by:   Deatra Robinson MD   Signed by:   Deatra Robinson MD on 11/16/2009   Method used:   Electronically to        [facility] (retail)       [address]       [place], [place]  [address]       Ph: [phone]       Fax: [phone]   RxID:   [id number] PRAVASTATIN SODIUM 20 MG TABS (PRAVASTATIN SODIUM) Take 1 tab by mouth at bedtime  #30 x 11   Entered and Authorized by:   Deatra Robinson MD   Signed by:   Deatra Robinson MD on 11/16/2009   Method used:   Electronically to        [facility] (retail)       [address]       [place], [place]  [address]       Ph: [phone]       Fax: [phone]   RxID:   [id number] HYDROCHLOROTHIAZIDE 25 MG TABS (HYDROCHLOROTHIAZIDE) Take 1 tablet by mouth once a day in am  #30 x 11   Entered and Authorized by:   Deatra Robinson MD   Signed by:   Deatra Robinson MD on 11/16/2009   Method used:   Electronically to        [facility] (retail)       [address]       [place], [place]  [address]       Ph: [phone]       Fax: [phone]   RxID:   [id number] METFORMIN HCL 1000 MG TABS (METFORMIN HCL) Take 1 tablet by mouth two times a day with meals  #60 x 11   Entered and Authorized by:   Deatra Robinson MD   Signed by:   Deatra Robinson MD on  11/16/2009   Method used:   Electronically to        [facility] (retail)       [address]       [place], [place]  [address]       Ph: [phone]       Fax: [phone]   RxID:   [id number] PROVENTIL HFA 108 (90 BASE) MCG/ACT AERS (ALBUTEROL SULFATE) qid as needed Brand medically necessary #1 x 11   Entered and Authorized by:   Deatra Robinson MD   Signed by:   Deatra Robinson MD on 11/16/2009   Method used:   Electronically to        [facility] (retail)       [address]       [place], [place]  [address]       Ph: [phone]       Fax: [phone]   RxID:   [id number]

## 2010-03-20 NOTE — Letter (Signed)
Summary: OPERATIVE REPORT  OPERATIVE REPORT   Imported By: Margie Billet 05/11/2009 10:49:38  _____________________________________________________________________  External Attachment:    Type:   Image     Comment:   External Document

## 2010-03-20 NOTE — Assessment & Plan Note (Signed)
Summary: ACUTE-HIGH BLOOD SUGAR AND NAUSEA/CFB([facility])   Vital Signs:  Patient profile:   65 year old female Height:      68 inches (172.72 cm) Weight:      180.0 pounds (81.82 kg) BMI:     27.47 Temp:     97.5 degrees F (36.39 degrees C) oral Pulse rate:   87 / minute BP sitting:   132 / 95  (left arm)  Vitals Entered By: Stanton Kidney Ditzler RN (May 09, 2009 9:00 AM)  Is Patient Diabetic? Yes Did you bring your meter with you today? No Pain Assessment Patient in pain? yes     Location: right hip Intensity: 7 Type: aching Onset of pain  past 2 days Nutritional Status BMI of 25 - 29 = overweight Nutritional Status Detail appetite good CBG Result 215  Have you ever been in a relationship where you felt threatened, hurt or afraid?denies   Does patient need assistance? Functional Status Self care Ambulation Normal Comments CBG have been 200's.   Primary Care Provider:  Deatra Robinson MD   History of Present Illness: Pt is a 65 yo female w/ past med hx below here for f/u on diabetes.  She notes her CBG's have consistently been > 200 at home.  She does not have her meter w/ her today.  She noticed her blood sugars have been increasing over the last 3 weeks.  However, at her last office visit in 12/10 she said her blood sugars were begining to trend up at that time.  She continues to watch her diet closely.  She increased metformin to 2 pills at night, which has not seemed to help her sugars much.  However, she has been having much more nausea since increasing.  She has not had any low blood sugars.  She checks her blood sugars once a day.  Also, she has not been taking her choleterol pill pill b/c she has heard too many people who complained about the side effects.  She only uses her advair inhaler 1-2 days a month.  Depression History:      The patient denies a depressed mood most of the day and a diminished interest in her usual daily activities.         Preventive  Screening-Counseling & Management  Alcohol-Tobacco     Alcohol drinks/day: 0     Smoking Status: quit     Smoking Cessation Counseling: yes     Packs/Day: 4 cig/week     Year Quit: 1970  Caffeine-Diet-Exercise     Caffeine use/day: cups of coffee     Nutrition Referrals: yes     Does Patient Exercise: yes     Type of exercise: walking     Exercise (avg: min/session): 30-60     Times/week: 5  Current Medications (verified): 1)  Proventil Hfa 108 (90 Base) Mcg/act Aers (Albuterol Sulfate) .... Qid As Needed 2)  Metformin Hcl 500 Mg Tabs (Metformin Hcl) .... Take 1 Tablet By Mouth With Breakfast and Two Tablets With Dinner 3)  Aspir-Low 81 Mg Tbec (Aspirin) .... Take 1 Tablet By Mouth Once A Day With Meals For Heart Protection 4)  Onetouch Ultra System W/device Kit (Blood Glucose Monitoring Suppl) .... Check Blood Sugar Twice A Day Before Meals 5)  Glimepiride 4 Mg Tabs (Glimepiride) .... Take 1 Tablet By Mouth Two Times A Day 6)  Hydrochlorothiazide 25 Mg Tabs (Hydrochlorothiazide) .... Take 1 Tablet By Mouth Once A Day in Am  Allergies (verified): No Known  Drug Allergies  Past History:  Past Medical History: Last updated: 09/02/2008 GERD-remote history/ add protonix for cough. Chest pain- normal EKG Asthma, moderate Diabetes mellitus, type II Hypertension Hyperlipidemia  Past Surgical History: Last updated: 09/02/2008 Cholecystectomy in 2006 BTL in 1988 C-section in 1988 L ankle ORIF in 1992  Social History: Last updated: 09/16/2008 Married, 5 children Unemployed Uninsured --does not qualify for an "[employer]' benefits. Education: 1 year of college ETOH: no Nikotine: 5 cig/week for 1 year. Quit in 1970 Drugs: no  Family History: Reviewed history from 09/02/2008 and no changes required. Father died from CAD at age of 40 y/o Mother died from Lung cancer at age 38 y/o  Social History: Reviewed history from 09/16/2008 and no changes required. Married, 5  children Unemployed Uninsured --does not qualify for an "[employer]' benefits. Education: 1 year of college ETOH: no Nikotine: 5 cig/week for 1 year. Quit in 1970 Drugs: no  Review of Systems       as per HPI.  Physical Exam  General:  Well-developed,well-nourished,in no acute distress; alert,appropriate and cooperative throughout examination Eyes:  anicteric, PERRL.  Mouth:  MMM Lungs:  Normal respiratory effort, chest expands symmetrically. Lungs are clear to auscultation, no crackles or wheezes. Heart:  Normal rate and regular rhythm. S1 and S2 normal without gallop, murmur, click, rub or other extra sounds. Abdomen:  +BS's, soft, NT and ND.   Extremities:  no peripheral edema.  Neurologic:  gait is normal. Psych:  mood and affect appropriate.   Impression & Recommendations:  Problem # 1:  DIABETES MELLITUS, TYPE II (ICD-250.00) A1c has continued to go up and she did not tolerate increased dose of metformin due to GI side effects.  At this point, given how elevated her a1c is, I do not think addition of oral agents will get her at goal.  She is open to starting insulin.  I have asked her to meet w/ Jamison Neighbor for teaching on self administration, etc, and to f/u w/ a provider in 2 weeks after she starts the insulin.  Will decrease metformin back down to dose she last tolerated well.  Will likely need to increase lantus dose but I did not want to add higher dose w/o seeing blood sugars.  Her updated medication list for this problem includes:    Metformin Hcl 500 Mg Tabs (Metformin hcl) .Marland Kitchen... Take 1 tablet by mouth twice a day.    Aspir-low 81 Mg Tbec (Aspirin) .Marland Kitchen... Take 1 tablet by mouth once a day with meals for heart protection    Glimepiride 4 Mg Tabs (Glimepiride) .Marland Kitchen... Take 1 tablet by mouth two times a day    Lantus 100 Unit/ml Soln (Insulin glargine) .Marland Kitchen... Take 8 units at bedtime.  Orders: T- Capillary Blood Glucose ([id number]) T-Hgb A1C (in-house) ([id number]) Diabetic  Clinic Referral (Diabetic)  Labs Reviewed: Creat: 0.80 (09/16/2008)     Last Eye Exam: normal (06/03/2008) Reviewed HgBA1c results: 9.4 (05/09/2009)  8.4 (09/02/2008)  Problem # 2:  HYPERLIPIDEMIA (ICD-272.4) She was not taking pravastatin b/c of concern for side effects.  I discussed the importance of this medication and she is willing to try it, starting at low dose.  Will start w/ 1/2 pill at bedtime x 2 weeks and increase if she tolerates it.  Recheck lipids/lft's in 2 or so months.  The following medications were removed from the medication list:    Pravastatin Sodium 20 Mg Tabs (Pravastatin sodium) .Marland Kitchen... Take 1 tablet by mouth once a day  Her updated medication list for this problem includes:    Pravastatin Sodium 20 Mg Tabs (Pravastatin sodium) .Marland Kitchen... Take 1 tab by mouth at bedtime  Problem # 3:  INTRINSIC ASTHMA, UNSPECIFIED (ICD-493.10) Pt using advair as a rescue medication, only 1-2x's/month.  I took it off her med list b/c it is at very low dose and I don't think it is doing her any benefit at this time by using as needed.  I discussed the importance of using albuterol as a rescue inhaler and if she is needing it more than 1x/month, she will let us know as she may need to have a chronic, daily inhaler added.  The following medications were removed from the medication list:    Advair Hfa 45-21 Mcg/act Aero (Fluticasone-salmeterol) ..... One puff daily Her updated medication list for this problem includes:    Proventil Hfa 108 (90 Base) Mcg/act Aers (Albuterol sulfate) ..... Qid as needed  Problem # 4:  HYPERTENSION, ESSENTIAL NOS (ICD-401.9) BP near goal.  Hold off on any changes given initiation of insulin and statin.  Her updated medication list for this problem includes:    Hydrochlorothiazide 25 Mg Tabs (Hydrochlorothiazide) .Marland Kitchen... Take 1 tablet by mouth once a day in am  BP today: 132/95 Prior BP: 136/89 (01/27/2009)  Labs Reviewed: K+: 4.2 (09/16/2008) Creat: : 0.80  (09/16/2008)   Chol: 204 (09/16/2008)   HDL: 49 (09/16/2008)   LDL: 130 (09/16/2008)   TG: 123 (09/16/2008)  Complete Medication List: 1)  Proventil Hfa 108 (90 Base) Mcg/act Aers (Albuterol sulfate) .... Qid as needed 2)  Metformin Hcl 500 Mg Tabs (Metformin hcl) .... Take 1 tablet by mouth twice a day. 3)  Aspir-low 81 Mg Tbec (Aspirin) .... Take 1 tablet by mouth once a day with meals for heart protection 4)  Onetouch Ultra System W/device Kit (Blood glucose monitoring suppl) .... Check blood sugar twice a day before meals 5)  Glimepiride 4 Mg Tabs (Glimepiride) .... Take 1 tablet by mouth two times a day 6)  Hydrochlorothiazide 25 Mg Tabs (Hydrochlorothiazide) .... Take 1 tablet by mouth once a day in am 7)  Pravastatin Sodium 20 Mg Tabs (Pravastatin sodium) .... Take 1 tab by mouth at bedtime 8)  Lantus 100 Unit/ml Soln (Insulin glargine) .... Take 8 units at bedtime.  Patient Instructions: 1)  Please make an appointment to see Jamison Neighbor to start insulin.   2)  Please followup with provider 2 weeks after starting insulin. 3)  Please start the cholesterol pill.  You can take 1/2 tab at bedtime for 2 weeks and then increase to a full tab. Prescriptions: LANTUS 100 UNIT/ML SOLN (INSULIN GLARGINE) Take 8 units at bedtime.  #3 bottles x 1   Entered and Authorized by:   Joaquin Courts  MD   Signed by:   Stanton Kidney Ditzler RN on 05/09/2009   Method used:   Electronically to        [facility] (retail)       [address]       [place], [place]  [address]       Ph: [phone]       Fax: [phone]   RxID:   [id number]    Prevention & Chronic Care Immunizations   Influenza vaccine: Not documented   Influenza vaccine deferral: Deferred  (09/16/2008)    Tetanus booster: 09/02/2008: Td   Tetanus booster due: 09/03/2018    Pneumococcal vaccine: Not  documented    H. zoster vaccine: Not documented   H. zoster vaccine deferral: Deferred   (09/16/2008)  Colorectal Screening   Hemoccult: Not documented   Hemoccult action/deferral: Ordered  (09/02/2008)    Colonoscopy: Not documented   Colonoscopy action/deferral: Deferred  (09/16/2008)  Other Screening   Pap smear: Refused  (01/01/2006)   Pap smear action/deferral: Refused  (09/16/2008)   Pap smear due: 09/03/2011    Mammogram: Normal  (12/01/2005)   Mammogram action/deferral: Ordered  (09/02/2008)   Mammogram due: 12/02/2006    DXA bone density scan: Not documented   DXA bone density action/deferral: Ordered  (09/02/2008)   DXA scan due: 08/2010    Smoking status: quit  (05/09/2009)  Diabetes Mellitus   HgbA1C: 9.4  (05/09/2009)    Eye exam: normal  (06/03/2008)   Diabetic eye exam action/deferral: Not indicated  (10/27/2008)   Eye exam due: 06/2009    Foot exam: yes  (09/02/2008)   Foot exam action/deferral: Do today   High risk foot: Not documented   Foot care education: Done  (09/02/2008)    Urine microalbumin/creatinine ratio: 6.3  (09/02/2008)    Diabetes flowsheet reviewed?: Yes   Progress toward A1C goal: Deteriorated  Lipids   Total Cholesterol: 204  (09/16/2008)   Lipid panel action/deferral: Lipid Panel ordered   LDL: 130  (09/16/2008)   LDL Direct: Not documented   HDL: 49  (09/16/2008)   Triglycerides: 123  (09/16/2008)    SGOT (AST): 27  (09/16/2008)   BMP action: Ordered   SGPT (ALT): 33  (09/16/2008)   Alkaline phosphatase: 57  (09/16/2008)   Total bilirubin: 0.3  (09/16/2008)    Lipid flowsheet reviewed?: Yes   Progress toward LDL goal: Unchanged  Hypertension   Last Blood Pressure: 132 / 95  (05/09/2009)   Serum creatinine: 0.80  (09/16/2008)   BMP action: Ordered   Serum potassium 4.2  (09/16/2008)   Basic metabolic panel due: 10/17/2008    Hypertension flowsheet reviewed?: Yes   Progress toward BP goal: Unchanged  Self-Management Support :   Personal Goals (by the next clinic visit) :     Personal A1C goal: 7   (09/16/2008)     Personal blood pressure goal: 130/80  (09/16/2008)     Personal LDL goal: 100  (09/16/2008)    Patient will work on the following items until the next clinic visit to reach self-care goals:     Medications and monitoring: take my medicines every day, check my blood sugar  (05/09/2009)     Eating: drink diet soda or water instead of juice or soda, eat more vegetables, use fresh or frozen vegetables, eat foods that are low in salt, eat baked foods instead of fried foods, eat fruit for snacks and desserts, limit or avoid alcohol  (05/09/2009)     Activity: take a 30 minute walk every day, take the stairs instead of the elevator, park at the far end of the parking lot  (05/09/2009)     Other: attend a self-management class  (09/16/2008)     Home glucose monitoring frequency: 1 time daily  (09/16/2008)    Diabetes self-management support: Written self-care plan, Education handout, Resources for patients handout, Referred for DM self-management training  (05/09/2009)   Diabetes care plan printed   Diabetes education handout printed   Referred for diabetes self-mgmt training.    Hypertension self-management support: Written self-care plan, Education handout, Resources for patients handout  (05/09/2009)   Hypertension self-care plan printed.  Hypertension education handout printed    Lipid self-management support: Written self-care plan, Education handout, Resources for patients handout  (05/09/2009)   Lipid self-care plan printed.   Lipid education handout printed      Resource handout printed.   Nursing Instructions: Refer for diabetes self-management training (see order)    Laboratory Results   Blood Tests   Date/Time Received: May 09, 2009 9:25 AM. Date/Time Reported: Alric Quan  May 09, 2009 9:25 AM   HGBA1C: 9.4%   (Normal Range: Non-Diabetic - 3-6%   Control Diabetic - 6-8%) CBG Random:: 215mg /dL     Diabetes Self Management Training  Referral Patient Name: Alyssa Haynes Date Of Birth: December 21, 1945 MRN: [id number] Current Diagnosis:  FOOT&TOE BLISTER WITHOUT MENTION OF INFECTION (ICD-917.2) PREVENTIVE HEALTH CARE (ICD-V70.0) INTRINSIC ASTHMA, UNSPECIFIED (ICD-493.10) HYPERLIPIDEMIA (ICD-272.4) DIABETES MELLITUS, TYPE II (ICD-250.00) HYPERTENSION, ESSENTIAL NOS (ICD-401.9) CHEST PAIN (ICD-786.50) GERD (ICD-530.81)     Management Training Needs:   Insulin Instruction  Complicating Conditions:  Dyslipidemia  Recurrent Hyperglycemia

## 2010-03-20 NOTE — Assessment & Plan Note (Signed)
Summary: ACUTE-DIABETES CHECK/([facility])/CFB   Vital Signs:  Patient profile:   65 year old female Height:      68 inches (172.72 cm) Weight:      181.3 pounds (82.41 kg) BMI:     27.67 Temp:     96.6 degrees F (35.89 degrees C) oral Pulse rate:   83 / minute BP sitting:   135 / 89  (right arm)  Vitals Entered By: Stanton Kidney Ditzler RN (October 27, 2008 10:29 AM) Is Patient Diabetic? Yes  Pain Assessment Patient in pain? no      Nutritional Status BMI of 25 - 29 = overweight Nutritional Status Detail appetite ok CBG Result 212  Have you ever been in a relationship where you felt threatened, hurt or afraid?denies   Does patient need assistance? Functional Status Self care Ambulation Normal Comments Past week c/o of nausea and pressure forehead. Pt ? sinus.   Primary Care Provider:  Deatra Robinson MD   History of Present Illness: 2 weeks follow up appointment: 1. HTN. Was changed from Benicar HCT to Zestoretic and then changed back to HCTZ 25 as she dveloped some cough and itching. 2. DM, type two -- forgot to bring her glocometer (started to check 2 weeks ago). Per patient's report, 170-200 range. Metformin 500 mg by mouth two times a day; glimiperide 4 mg  twice daily. 3. Asthma --no exacerbations.  4 She c/o nasal congestion and sore throat for last 1 week. She does have some cough but very occasionaly and does not c/o any fever,chills,sputum production or difficulty breathing.  Depression History:      The patient denies a depressed mood most of the day and a diminished interest in her usual daily activities.         Preventive Screening-Counseling & Management  Alcohol-Tobacco     Alcohol drinks/day: 0     Smoking Status: quit     Smoking Cessation Counseling: yes     Packs/Day: 4 cig/week     Year Quit: 1970  Caffeine-Diet-Exercise     Caffeine use/day: cups of coffee     Nutrition Referrals: yes     Does Patient Exercise: yes     Type of exercise:  walking     Exercise (avg: min/session): 30-60     Times/week: 5  Current Medications (verified): 1)  Proventil Hfa 108 (90 Base) Mcg/act Aers (Albuterol Sulfate) .... Qid As Needed 2)  Advair Hfa 45-21 Mcg/act Aero (Fluticasone-Salmeterol) .... One Puff Daily 3)  Metformin Hcl 850 Mg Tabs (Metformin Hcl) .... Take 1 Tablet By Mouth Two Times A Day 4)  Aspir-Low 81 Mg Tbec (Aspirin) .... Take 1 Tablet By Mouth Once A Day With Meals For Heart Protection 5)  Onetouch Ultra System W/device Kit (Blood Glucose Monitoring Suppl) .... Check Blood Sugar Twice A Day Before Meals 6)  Glimepiride 4 Mg Tabs (Glimepiride) .... Take 1 Tablet By Mouth Two Times A Day 7)  Cvs Omeprazole 20 Mg Tbec (Omeprazole) .... Take 1 Tablet By Mouth Two Times A Day Before Meals 8)  Hydrochlorothiazide 25 Mg Tabs (Hydrochlorothiazide) .... Take 1 Tablet By Mouth Once A Day in Am 9)  Pravastatin Sodium 20 Mg Tabs (Pravastatin Sodium) .... Take 1 Tablet By Mouth Once A Day  Allergies (verified): No Known Drug Allergies  Review of Systems      See HPI  Physical Exam  General:  alert.   Head:  no abnormalities observed.   Eyes:  pupils equal, pupils round,  pupils reactive to light, and pupils react to accomodation.   Ears:  R ear normal and L ear normal.   Nose:  no external deformity, no airflow obstruction, no intranasal foreign body, nasal discharge, mucosal erythema, and mucosal edema.   Mouth:  pharyngeal erythema.   Neck:  supple and no masses.   Lungs:  normal respiratory effort and normal breath sounds.   Heart:  normal rate, regular rhythm, and no murmur.   Abdomen:  soft and normal bowel sounds.   Msk:  normal ROM.   Pulses:  Pedal pulses 2+/4 bilaterally Extremities:  No edema bilaterally Neurologic:  alert & oriented X3.  gait normal.   Skin:  turgor normal and no rashes.   Cervical Nodes:  no anterior cervical adenopathy.   Psych:  Oriented X3.     Impression & Recommendations:  Problem # 1:   SINUSITIS, ACUTE (ICD-461.9) Most probably viral in origin with no signs or symptoms s/o bacterial infection. I suggested her steam inhalation, saline gargles and OTC decongestents for relief. Some tenderness was + over ethmoid sinuses on exam. Instructed on treatment. Call if symptoms persist or worsen.   Problem # 2:  HYPERLIPIDEMIA (ICD-272.4) Will repeat in 6 months and have started her on pravachol 20 for now as she has a h/o severe cramps with crestor in the past and also since cost is an issue for her. There is an absolute risk reduction in her heart attck risk if we decrease her chlesterol. Her updated medication list for this problem includes:    Pravastatin Sodium 20 Mg Tabs (Pravastatin sodium) .Marland Kitchen... Take 1 tablet by mouth once a day  Labs Reviewed: SGOT: 27 (09/16/2008)   SGPT: 33 (09/16/2008)   HDL:49 (09/16/2008)  LDL:130 (09/16/2008)  Chol:204 (09/16/2008)  Trig:123 (09/16/2008)  Problem # 3:  DIABETES MELLITUS, TYPE II (ICD-250.00) She did not bring glucometer with her today but she says that her blood sugars have been running in 170's-200's fasting. Will increase her metformin to 850 twice a day and repeat HBa1c in 2 months time fromnow befor next visit. Also counselled her about diet and weight loss. Her updated medication list for this problem includes:    Metformin Hcl 850 Mg Tabs (Metformin hcl) .Marland Kitchen... Take 1 tablet by mouth two times a day    Aspir-low 81 Mg Tbec (Aspirin) .Marland Kitchen... Take 1 tablet by mouth once a day with meals for heart protection    Glimepiride 4 Mg Tabs (Glimepiride) .Marland Kitchen... Take 1 tablet by mouth two times a day  Orders: Capillary Blood Glucose/CBG ([id number])Future Orders: T- Hemoglobin A1C ([id number]) ... 12/15/2008  Labs Reviewed: Creat: 0.80 (09/16/2008)     Last Eye Exam: normal (06/03/2008) Reviewed HgBA1c results: 8.4 (09/02/2008)  Problem # 4:  HYPERTENSION, ESSENTIAL NOS (ICD-401.9) She is tolerating HCTZ well and her blood pressure is at goal  today. Her updated medication list for this problem includes:    Hydrochlorothiazide 25 Mg Tabs (Hydrochlorothiazide) .Marland Kitchen... Take 1 tablet by mouth once a day in am  BP today: 135/89 Prior BP: 166/83 (09/16/2008)  Labs Reviewed: K+: 4.2 (09/16/2008) Creat: : 0.80 (09/16/2008)   Chol: 204 (09/16/2008)   HDL: 49 (09/16/2008)   LDL: 130 (09/16/2008)   TG: 123 (09/16/2008)  Complete Medication List: 1)  Proventil Hfa 108 (90 Base) Mcg/act Aers (Albuterol sulfate) .... Qid as needed 2)  Advair Hfa 45-21 Mcg/act Aero (Fluticasone-salmeterol) .... One puff daily 3)  Metformin Hcl 850 Mg Tabs (Metformin hcl) .... Take 1 tablet by  mouth two times a day 4)  Aspir-low 81 Mg Tbec (Aspirin) .... Take 1 tablet by mouth once a day with meals for heart protection 5)  Onetouch Ultra System W/device Kit (Blood glucose monitoring suppl) .... Check blood sugar twice a day before meals 6)  Glimepiride 4 Mg Tabs (Glimepiride) .... Take 1 tablet by mouth two times a day 7)  Cvs Omeprazole 20 Mg Tbec (Omeprazole) .... Take 1 tablet by mouth two times a day before meals 8)  Hydrochlorothiazide 25 Mg Tabs (Hydrochlorothiazide) .... Take 1 tablet by mouth once a day in am 9)  Pravastatin Sodium 20 Mg Tabs (Pravastatin sodium) .... Take 1 tablet by mouth once a day  Patient Instructions: 1)  Please schedule a follow-up appointment in 2 months. 2)  Please return for a FASTING Lipid Profile and HgBA1c in 3 months. 3)  Tobacco is very bad for your health and your loved ones! You Should stop smoking!. 4)  It is important that you exercise regularly at least 20 minutes 5 times a week. If you develop chest pain, have severe difficulty breathing, or feel very tired , stop exercising immediately and seek medical attention. 5)  You need to lose weight. Consider a lower calorie diet and regular exercise.  6)  It is not healthy  for men to drink more than 2-3 drinks per day or for women to drink more than 1-2 drinks per  day. 7)  You need to have a Pap Smear to prevent cervical cancer. 8)  Take an Aspirin every day. 9)  Check your blood sugars regularly. If your readings are usually above : 200 or below 70 you should contact our office. 10)  It is important that your Diabetic A1c level is checked every 3 months. 11)  Check your feet each night for sore areas, calluses or signs of infection. 12)  Check your Blood Pressure regularly. If it is above: you should make an appointment. 13)  It is important to use your inhaler properly. Use a spacer, take slow deep breaths and hold them. Rinse your mouth after using.  14)  Acute sinusitis symptoms for less than 10 days are not helped by antibiotics.Use warm moist compresses, and over the counter decongestants ( only as directed). Use phenylephrine nasal spray 1-2 spray every 4 hours as needed. 15)   Call if no improvement in 5-7 days, sooner if increasing pain, fever, or new symptoms. 16)  HbgA1C prior to visit, ICD-9: Prescriptions: PRAVASTATIN SODIUM 20 MG TABS (PRAVASTATIN SODIUM) Take 1 tablet by mouth once a day  #31 x 6   Entered and Authorized by:   Lars Mage MD   Signed by:   Lars Mage MD on 10/27/2008   Method used:   Electronically to        [facility] (retail)       [address]       [place], [place]  [address]       Ph: [phone]       Fax: [phone]   RxID:   [id number] METFORMIN HCL 850 MG TABS (METFORMIN HCL) Take 1 tablet by mouth two times a day  #62 x 6   Entered and Authorized by:   Lars Mage MD   Signed by:   Lars Mage MD on 10/27/2008   Method used:   Electronically to        [facility] [address] (retail)  [address]       [place], [place]  [address]       Ph: [phone]       Fax: [phone]   RxID:   [id number]  Process Orders Check Orders Results:     [facility]: ABN not required for this insurance Tests Sent for requisitioning  (October 27, 2008 9:38 PM):     12/15/2008: [facility] -- T- Hemoglobin A1C [[id number]] (signed)     Prevention & Chronic Care Immunizations   Influenza vaccine: Not documented   Influenza vaccine deferral: Deferred  (09/16/2008)    Tetanus booster: 09/02/2008: Td   Tetanus booster due: 09/03/2018    Pneumococcal vaccine: Not documented    H. zoster vaccine: Not documented   H. zoster vaccine deferral: Deferred  (09/16/2008)  Colorectal Screening   Hemoccult: Not documented   Hemoccult action/deferral: Ordered  (09/02/2008)    Colonoscopy: Not documented   Colonoscopy action/deferral: Deferred  (09/16/2008)  Other Screening   Pap smear: Refused  (01/01/2006)   Pap smear action/deferral: Refused  (09/16/2008)   Pap smear due: 09/03/2011    Mammogram: Normal  (12/01/2005)   Mammogram action/deferral: Ordered  (09/02/2008)   Mammogram due: 12/02/2006    DXA bone density scan: Not documented   DXA bone density action/deferral: Ordered  (09/02/2008)   DXA scan due: 08/2010    Smoking status: quit  (10/27/2008)  Diabetes Mellitus   HgbA1C: 8.4  (09/02/2008)    Eye exam: normal  (06/03/2008)   Diabetic eye exam action/deferral: Not indicated  (10/27/2008)   Eye exam due: 06/2009    Foot exam: yes  (09/02/2008)   Foot exam action/deferral: Do today   High risk foot: Not documented   Foot care education: Done  (09/02/2008)    Urine microalbumin/creatinine ratio: 6.3  (09/02/2008)    Diabetes flowsheet reviewed?: Yes   Progress toward A1C goal: Unchanged  Lipids   Total Cholesterol: 204  (09/16/2008)   Lipid panel action/deferral: Lipid Panel ordered   LDL: 130  (09/16/2008)   LDL Direct: Not documented   HDL: 49  (09/16/2008)   Triglycerides: 123  (09/16/2008)    SGOT (AST): 27  (09/16/2008)   BMP action: Ordered   SGPT (ALT): 33  (09/16/2008)   Alkaline phosphatase: 57  (09/16/2008)   Total bilirubin: 0.3  (09/16/2008)    Lipid  flowsheet reviewed?: Yes   Progress toward LDL goal: Unchanged  Hypertension   Last Blood Pressure: 135 / 89  (10/27/2008)   Serum creatinine: 0.80  (09/16/2008)   BMP action: Ordered   Serum potassium 4.2  (09/16/2008)   Basic metabolic panel due: 10/17/2008    Hypertension flowsheet reviewed?: Yes   Progress toward BP goal: Improved  Self-Management Support :   Personal Goals (by the next clinic visit) :     Personal A1C goal: 7  (09/16/2008)     Personal blood pressure goal: 130/80  (09/16/2008)     Personal LDL goal: 100  (09/16/2008)     Home glucose monitoring frequency: 1 time daily  (09/16/2008)    Diabetes self-management support: Education handout, Written self-care plan, Referred for DM self-management training  (09/16/2008)    Hypertension self-management support: Written self-care plan  (09/16/2008)    Lipid self-management support: Written self-care plan  (09/16/2008)    Process Orders Check Orders Results:     [facility]: ABN not required for this insurance Tests Sent  for requisitioning (October 27, 2008 9:38 PM):     12/15/2008: [facility] -- T- Hemoglobin A1C [[id number]] (signed)

## 2010-03-20 NOTE — Progress Notes (Signed)
Summary: high blood sugars/dmr  Phone Note Call from Patient Call back at Home Phone [phone]   Caller: Patient Summary of Call: patient called yeterday with high blood sugars, :staying higher than 200 all the time" metformin 500 twice a day, glimiperide-4mg  twice a day- has not missed nay doses, trying to drink plenty of non-calorc fluids nad eat healthy, knows she needs more exercsie-planninng to work onthat fasting today was >200 mg/dl- no infectious symptoms, cough,fever, vaginal discharge. Did have cold a few weeks back that seemd , for which she took sinus medication and  tussinex, and this is when higher sugars began, has apointment 05/2009- wants to know if she needs to be seen sooner. Feels fine other than really tired, normally when sugar spikes it comes back down , but it hasn't this time.  Discussed retrying increased dose of metformin to 1000 mg every night nad 500 mg every monring for 1 week then 1000 mg twice a day after that if tolerating. Patient wiling to retry and to call us in one week if blood sugars not improved. Told her i would call her back if attending physician not in agreement with plan.  Initial call taken by: Jamison Neighbor RD,CDE,  April 27, 2009 1:51 PM  Follow-up for Phone Call        I agree with trying escalate the metformin dose.  Understand that she was unable to tolerate higher doses in past.  PT needs to understand that it may not result in an immediate reduction in CBGs.  If they increase or she develops any other sxs she needs to let us know.  It looks like insulin has been discussed and she was aggreable if it came to that.  Her other dm med is at max dose. Follow-up by: Blanch Media MD,  April 27, 2009 3:02 PM    New/Updated Medications: METFORMIN HCL 500 MG TABS (METFORMIN HCL) Take 1 tablet by mouth with breakfast and two tablets with dinner

## 2010-03-20 NOTE — Assessment & Plan Note (Signed)
Summary: congestion,sinus pain x 1wk/pcp-karimova/hla   Vital Signs:  Patient profile:   65 year old female Height:      68 inches (172.72 cm) Weight:      182.7 pounds (83.05 kg) BMI:     27.88 Temp:     97.4 degrees F (36.33 degrees C) oral Pulse rate:   89 / minute BP sitting:   138 / 88  (left arm) Cuff size:   regular  Vitals Entered By: Theotis Barrio NT II (Jun 22, 2009 1:57 PM) CC: CHEST CONGESTION / SINUS PAIN   FOR 1 WEEK Is Patient Diabetic? Yes Did you bring your meter with you today? No Pain Assessment Patient in pain? no      Nutritional Status BMI of 25 - 29 = overweight CBG Result 251  Have you ever been in a relationship where you felt threatened, hurt or afraid?No   Does patient need assistance? Functional Status Self care Ambulation Normal Comments CHEST CONGESTION   /  SINUS PAIN FOR ABOUT 1 WEEK   Primary Care Provider:  Deatra Robinson MD  CC:  CHEST CONGESTION / SINUS PAIN   FOR 1 WEEK.  History of Present Illness: 65 year old lady with PMH as mentioned in the EMR comes to the office for  1. Pt calls c/o congestion and sinus pain x 1 wk. Started off with runny nose 2 weeks ago and reports that her husband is also "sick " too. She denies any fever but complaints of productive cough with mucoid to yellowish discoloration. She reports that she has asthma and has been wheezing more and using the inhaler more frequently. She denies any CP,N/V/D/AP.  She denies any other complaints.  Preventive Screening-Counseling & Management  Alcohol-Tobacco     Alcohol drinks/day: 0     Smoking Status: quit     Smoking Cessation Counseling: yes     Packs/Day: 4 cig/week     Year Quit: 1970  Caffeine-Diet-Exercise     Caffeine use/day: cups of coffee     Nutrition Referrals: yes     Does Patient Exercise: yes     Type of exercise: walking     Exercise (avg: min/session): 30-60     Times/week: 5  Problems Prior to Update: 1)  Foot&toe Blister Without  Mention of Infection  (ICD-917.2) 2)  Preventive Health Care  (ICD-V70.0) 3)  Intrinsic Asthma, Unspecified  (ICD-493.10) 4)  Hyperlipidemia  (ICD-272.4) 5)  Diabetes Mellitus, Type II  (ICD-250.00) 6)  Hypertension, Essential Nos  (ICD-401.9) 7)  Chest Pain  (ICD-786.50) 8)  Gerd  (ICD-530.81)  Medications Prior to Update: 1)  Proventil Hfa 108 (90 Base) Mcg/act Aers (Albuterol Sulfate) .... Qid As Needed 2)  Metformin Hcl 1000 Mg Tabs (Metformin Hcl) .... Take 1 Tablet By Mouth Two Times A Day With Meals 3)  Aspir-Low 81 Mg Tbec (Aspirin) .... Take 1 Tablet By Mouth Once A Day With Meals For Heart Protection 4)  Onetouch Ultra System W/device Kit (Blood Glucose Monitoring Suppl) .... Check Blood Sugar Twice A Day Before Meals 5)  Glimepiride 4 Mg Tabs (Glimepiride) .... Take 1 Tablet By Mouth Two Times A Day 6)  Hydrochlorothiazide 25 Mg Tabs (Hydrochlorothiazide) .... Take 1 Tablet By Mouth Once A Day in Am 7)  Pravastatin Sodium 20 Mg Tabs (Pravastatin Sodium) .... Take 1 Tab By Mouth At Bedtime 8)  Lantus 100 Unit/ml Soln (Insulin Glargine) .... Take 14 Units At Bedtime  Current Medications (verified): 1)  Proventil  Hfa 108 (90 Base) Mcg/act Aers (Albuterol Sulfate) .... Qid As Needed 2)  Metformin Hcl 1000 Mg Tabs (Metformin Hcl) .... Take 1 Tablet By Mouth Two Times A Day With Meals 3)  Aspir-Low 81 Mg Tbec (Aspirin) .... Take 1 Tablet By Mouth Once A Day With Meals For Heart Protection 4)  Onetouch Ultra System W/device Kit (Blood Glucose Monitoring Suppl) .... Check Blood Sugar Twice A Day Before Meals 5)  Glimepiride 4 Mg Tabs (Glimepiride) .... Take 1 Tablet By Mouth Two Times A Day 6)  Hydrochlorothiazide 25 Mg Tabs (Hydrochlorothiazide) .... Take 1 Tablet By Mouth Once A Day in Am 7)  Pravastatin Sodium 20 Mg Tabs (Pravastatin Sodium) .... Take 1 Tab By Mouth At Bedtime 8)  Lantus 100 Unit/ml Soln (Insulin Glargine) .... Take 14 Units At Bedtime  Allergies (verified): No  Known Drug Allergies  Past History:  Family History: Last updated: 09-12-2008 Father died from CAD at age of 26 y/o Mother died from Lung cancer at age 60 y/o  Social History: Last updated: 09/16/2008 Married, 5 children Unemployed Uninsured --does not qualify for an "[employer]' benefits. Education: 1 year of college ETOH: no Nikotine: 5 cig/week for 1 year. Quit in 1970 Drugs: no  Risk Factors: Alcohol Use: 0 (06/22/2009) Caffeine Use: cups of coffee (06/22/2009) Exercise: yes (06/22/2009)  Risk Factors: Smoking Status: quit (06/22/2009) Packs/Day: 4 cig/week (06/22/2009)  Past Medical History: Reviewed history from 12-Sep-2008 and no changes required. GERD-remote history/ add protonix for cough. Chest pain- normal EKG Asthma, moderate Diabetes mellitus, type II Hypertension Hyperlipidemia  Past Surgical History: Reviewed history from 12-Sep-2008 and no changes required. Cholecystectomy in 2006 BTL in 1988 C-section in 1988 L ankle ORIF in 1992  Family History: Reviewed history from Sep 12, 2008 and no changes required. Father died from CAD at age of 49 y/o Mother died from Lung cancer at age 67 y/o  Social History: Reviewed history from 09/16/2008 and no changes required. Married, 5 children Unemployed Uninsured --does not qualify for an "[employer]' benefits. Education: 1 year of college ETOH: no Nikotine: 5 cig/week for 1 year. Quit in 1970 Drugs: no  Review of Systems      See HPI  Physical Exam  General:  Well-developed,well-nourished,in no acute distress; alert,appropriate and cooperative throughout examination Head:  normocephalic and atraumatic.   no sinus tenderness. Nose:  no external deformity and no nasal discharge.   Mouth:  good dentition, no gingival abnormalities, and pharyngeal erythema.   Lungs:  Normal respiratory effort, chest expands symmetrically. Lungs are clear to auscultation, no crackles or wheezes. Heart:  Normal rate and  regular rhythm. S1 and S2 normal without gallop, murmur, click, rub or other extra sounds. Abdomen:  Bowel sounds positive,abdomen soft and non-tender without masses, organomegaly or hernias noted. Pulses:  R radial normal.   Extremities:  no peripheral edema.  Neurologic:  alert & oriented X3, strength normal in all extremities, and gait normal.     Impression & Recommendations:  Problem # 1:  ACUTE BRONCHITIS (ICD-466.0)  Suspect infectious etiology. Given the symptoms going on for >2 weeks and in the setting of all her comborbid issues, will empirically treat with Amoxicillin for 7 days. Clinically she doesn't have a component of asthma. Will also give Benzonatate.   Her updated medication list for this problem includes    Proventil Hfa 108 (90 Base) Mcg/act Aers (Albuterol sulfate) ..... Qid as needed  Her updated medication list for this problem includes:    Proventil Hfa  108 (90 Base) Mcg/act Aers (Albuterol sulfate) ..... Qid as needed    Amoxicillin 500 Mg Caps (Amoxicillin) .Marland Kitchen... Take 1 tablet by mouth two times a day for 7 days    Benzonatate 100 Mg Caps (Benzonatate) .Marland Kitchen... Take 1 tablet by mouth three times a day as needed for cough  Problem # 2:  HYPERTENSION, ESSENTIAL NOS (ICD-401.9) Well controlled. Continue current regimen.  Her updated medication list for this problem includes:    Hydrochlorothiazide 25 Mg Tabs (Hydrochlorothiazide) .Marland Kitchen... Take 1 tablet by mouth once a day in am  BP today: 138/88 Prior BP: 145/93 (06/12/2009)  Labs Reviewed: K+: 4.2 (09/16/2008) Creat: : 0.80 (09/16/2008)   Chol: 204 (09/16/2008)   HDL: 49 (09/16/2008)   LDL: 130 (09/16/2008)   TG: 123 (09/16/2008)  Complete Medication List: 1)  Proventil Hfa 108 (90 Base) Mcg/act Aers (Albuterol sulfate) .... Qid as needed 2)  Metformin Hcl 1000 Mg Tabs (Metformin hcl) .... Take 1 tablet by mouth two times a day with meals 3)  Aspir-low 81 Mg Tbec (Aspirin) .... Take 1 tablet by mouth once a day  with meals for heart protection 4)  Onetouch Ultra System W/device Kit (Blood glucose monitoring suppl) .... Check blood sugar twice a day before meals 5)  Glimepiride 4 Mg Tabs (Glimepiride) .... Take 1 tablet by mouth two times a day 6)  Hydrochlorothiazide 25 Mg Tabs (Hydrochlorothiazide) .... Take 1 tablet by mouth once a day in am 7)  Pravastatin Sodium 20 Mg Tabs (Pravastatin sodium) .... Take 1 tab by mouth at bedtime 8)  Lantus 100 Unit/ml Soln (Insulin glargine) .... Take 14 units at bedtime 9)  Amoxicillin 500 Mg Caps (Amoxicillin) .... Take 1 tablet by mouth two times a day for 7 days 10)  Benzonatate 100 Mg Caps (Benzonatate) .... Take 1 tablet by mouth three times a day as needed for cough  Other Orders: Capillary Blood Glucose/CBG ([address])  Patient Instructions: 1)  Follow up with [place] as previous recommended.  2)  Check your blood sugars regularly. If your readings are usually above : or below 70 you should contact our office. Prescriptions: BENZONATATE 100 MG CAPS (BENZONATATE) Take 1 tablet by mouth three times a day as needed for cough  #30 x 1   Entered and Authorized by:   Blondell Reveal MD   Signed by:   Blondell Reveal MD on 06/22/2009   Method used:   Electronically to        [facility] (retail)       [address]       [place], [place]  [address]       Ph: [phone]       Fax: [phone]   RxID:   [id number] AMOXICILLIN 500 MG CAPS (AMOXICILLIN) Take 1 tablet by mouth two times a day for 7 days  #14 x 0   Entered and Authorized by:   Blondell Reveal MD   Signed by:   Blondell Reveal MD on 06/22/2009   Method used:   Electronically to        [facility] (retail)       [address]       [place], [place]  [address]       Ph: [phone]       Fax: [phone]   RxID:   [id number]    Prevention & Chronic Care  Immunizations   Influenza vaccine: Not documented   Influenza vaccine  deferral: Deferred  (09/16/2008)   Influenza vaccine due: 10/19/2009    Tetanus booster: 09/02/2008: Td   Tetanus booster due: 09/03/2018    Pneumococcal vaccine: Not documented   Pneumococcal vaccine deferral: Not indicated  (06/12/2009)    H. zoster vaccine: Not documented   H. zoster vaccine deferral: Not available  (06/12/2009)  Colorectal Screening   Hemoccult: Not documented   Hemoccult action/deferral: Ordered  (06/12/2009)    Colonoscopy: Not documented   Colonoscopy action/deferral: Refused  (06/12/2009)  Other Screening   Pap smear: Refused  (01/01/2006)   Pap smear action/deferral: Refused  (09/16/2008)   Pap smear due: 09/03/2011    Mammogram: Normal  (12/01/2005)   Mammogram action/deferral: Ordered  (06/12/2009)   Mammogram due: 12/02/2006    DXA bone density scan: Not documented   DXA bone density action/deferral: Ordered  (09/02/2008)   DXA scan due: 08/2010    Smoking status: quit  (06/22/2009)  Diabetes Mellitus   HgbA1C: 9.4  (05/09/2009)    Eye exam: normal  (06/03/2008)   Diabetic eye exam action/deferral: Not indicated  (10/27/2008)   Eye exam due: 06/2009    Foot exam: yes  (06/12/2009)   Foot exam action/deferral: Do today   High risk foot: Not documented   Foot care education: Done  (09/02/2008)    Urine microalbumin/creatinine ratio: 6.3  (09/02/2008)  Lipids   Total Cholesterol: 204  (09/16/2008)   Lipid panel action/deferral: Lipid Panel ordered   LDL: 130  (09/16/2008)   LDL Direct: Not documented   HDL: 49  (09/16/2008)   Triglycerides: 123  (09/16/2008)    SGOT (AST): 27  (09/16/2008)   BMP action: Ordered   SGPT (ALT): 33  (09/16/2008)   Alkaline phosphatase: 57  (09/16/2008)   Total bilirubin: 0.3  (09/16/2008)  Hypertension   Last Blood Pressure: 138 / 88  (06/22/2009)   Serum creatinine: 0.80  (09/16/2008)   BMP action: Ordered   Serum potassium 4.2  (09/16/2008)   Basic metabolic panel due:  10/17/2008  Self-Management Support :   Personal Goals (by the next clinic visit) :     Personal A1C goal: 7  (09/16/2008)     Personal blood pressure goal: 130/80  (09/16/2008)     Personal LDL goal: 100  (09/16/2008)    Patient will work on the following items until the next clinic visit to reach self-care goals:     Medications and monitoring: take my medicines every day, check my blood sugar, bring all of my medications to every visit, examine my feet every day  (06/22/2009)     Eating: drink diet soda or water instead of juice or soda, eat more vegetables, use fresh or frozen vegetables, eat foods that are low in salt, eat baked foods instead of fried foods, eat fruit for snacks and desserts, limit or avoid alcohol  (06/22/2009)     Activity: take a 30 minute walk every day  (06/22/2009)     Other: attend a self-management class  (09/16/2008)     Home glucose monitoring frequency: 1 time daily  (09/16/2008)    Diabetes self-management support: Resources for patients handout  (06/22/2009)   Last diabetes self-management training by diabetes educator: 05/15/2009    Hypertension self-management support: Resources for patients handout  (06/22/2009)    Lipid self-management support: Resources for patients handout  (06/22/2009)        Resource handout printed.

## 2010-03-20 NOTE — Progress Notes (Signed)
Summary: refill/ hla  Phone Note Refill Request Message from:  Fax from Pharmacy on Jul 05, 2009 5:30 PM  Refills Requested: Medication #1:  HYDROCHLOROTHIAZIDE 25 MG TABS Take 1 tablet by mouth once a day in am   Last Refilled: 4/16 Initial call taken by: Marin Roberts RN,  Jul 05, 2009 5:30 PM  Follow-up for Phone Call        Rx faxed to pharmacy Follow-up by: Deatra Robinson MD,  Jul 05, 2009 7:56 PM    Prescriptions: HYDROCHLOROTHIAZIDE 25 MG TABS (HYDROCHLOROTHIAZIDE) Take 1 tablet by mouth once a day in am  #30 x 3   Entered and Authorized by:   Deatra Robinson MD   Signed by:   Deatra Robinson MD on 07/05/2009   Method used:   Electronically to        [facility] (retail)       [address]       [place], [place]  [address]       Ph: [phone]       Fax: [phone]   RxID:   [id number]

## 2010-03-20 NOTE — Progress Notes (Signed)
Summary: referral for Diabetes self managment training/dmr  Phone Note Outgoing Call   Call placed by: Jamison Neighbor RD,CDE,  September 19, 2008 5:03 PM Reason for Call: Confirm/change Appt Summary of Call: note patient is uninsured and not covered by [facility] card(s). Called her to explain that there would be a charge for diabetes self managment training at [facility] Medicine center. However, [facility] has an ADA recognized diabetes self managmetn program that is grant funded and therefore no charge. [facility] and to cancel appoinmtment tomorrow.   Wil assist referral to [facility] ADA Recognized Diabetes program if PCP agrees. Referral form put in Dr. Wendie Chess box in [place].

## 2010-03-20 NOTE — Consult Note (Signed)
Summary: [facility]  [facility]   Imported By: Florinda Marker 09/12/2008 15:44:55  _____________________________________________________________________  External Attachment:    Type:   Image     Comment:   External Document

## 2010-03-20 NOTE — Progress Notes (Signed)
Summary: change pharmacy to [facility]/ hla  Phone Note Call from Patient   Summary of Call: all scripts have been called to [facility] per pt request Initial call taken by: Marin Roberts RN,  September 27, 2008 10:47 AM

## 2010-03-20 NOTE — Progress Notes (Signed)
Summary: refill/ hla  Phone Note Refill Request Message from:  Pharmacy on March 22, 2009 12:11 PM  Refills Requested: Medication #1:  GLIMEPIRIDE 4 MG TABS Take 1 tablet by mouth two times a day [BMN]   Last Refilled: 12/21 Initial call taken by: Marin Roberts RN,  March 22, 2009 12:11 PM  Follow-up for Phone Call        Refilled electronically.  Appt requested with PCP. Follow-up by: Margarito Liner MD,  March 22, 2009 3:50 PM    Prescriptions: GLIMEPIRIDE 4 MG TABS (GLIMEPIRIDE) Take 1 tablet by mouth two times a day Brand medically necessary #60 x 3   Entered and Authorized by:   Margarito Liner MD   Signed by:   Margarito Liner MD on 03/22/2009   Method used:   Electronically to        [facility] (retail)       [address]       [place], [place]  [address]       Ph: [phone]       Fax: [phone]   RxID:   [id number]

## 2010-03-20 NOTE — Progress Notes (Signed)
Summary: refill/gg  Phone Note Refill Request  on May 01, 2009 2:50 PM  Refills Requested: Medication #1:  METFORMIN HCL 500 MG TABS Take 1 tablet by mouth with breakfast and two tablets with dinner one month supply as pt has appointment with you in April   Method Requested: Electronic Initial call taken by: Merrie Roof RN,  May 01, 2009 2:50 PM    Prescriptions: METFORMIN HCL 500 MG TABS (METFORMIN HCL) Take 1 tablet by mouth with breakfast and two tablets with dinner  #90 x 1   Entered and Authorized by:   Deatra Robinson MD   Signed by:   Deatra Robinson MD on 05/01/2009   Method used:   Electronically to        [facility] (retail)       [address]       [place], [place]  [address]       Ph: [phone]       Fax: [phone]   RxID:   [id number]

## 2010-03-20 NOTE — Miscellaneous (Signed)
Summary: Dr. Kellie Shropshire  Dr. Kellie Shropshire   Imported By: Florinda Marker 09/16/2008 15:02:19  _____________________________________________________________________  External Attachment:    Type:   Image     Comment:   External Document

## 2010-03-20 NOTE — Medication Information (Signed)
Summary: [facility]. Pharmacy: [facility] Referral Form  [facility]: [facility] Referral Form   Imported By: Florinda Marker 10/05/2008 15:48:01  _____________________________________________________________________  External Attachment:    Type:   Image     Comment:   External Document

## 2010-03-20 NOTE — Assessment & Plan Note (Signed)
Summary: FU VISIT/DS   Vital Signs:  Patient profile:   65 year old female Height:      68 inches (172.72 cm) Weight:      185.5 pounds (84.32 kg) BMI:     28.31 Temp:     97.6 degrees F (36.44 degrees C) oral Pulse rate:   71 / minute BP sitting:   166 / 83  (right arm)  Vitals Entered By: Filomena Jungling NT II (September 16, 2008 9:46 AM) CC: follow-up visit Is Patient Diabetic? Yes  Pain Assessment Patient in pain? no      Nutritional Status BMI of 25 - 29 = overweight  Have you ever been in a relationship where you felt threatened, hurt or afraid?No   Does patient need assistance? Functional Status Self care Ambulation Normal   CC:  follow-up visit.  History of Present Illness: 2 weeks follow up appointment: 1. HTN. Was changed from Benicar HCT to Zestoretic. Denies any cough, edema or rash. Did not take Zestoretic this am yet.  2. DM, type two -- forgot to bring her glocometer (started to check 2 weeks ago). Per patient's report, 160-180 range. Metformin 500 mg by mouth two times a day; glimiperide 4 mg once daily (instead of twice daily as was Rx-ed). 3. Asthma --no exacerbations.   Preventive Screening-Counseling & Management  Caffeine-Diet-Exercise     Caffeine use/day: cups of coffee     Nutrition Referrals: yes     Does Patient Exercise: yes     Type of exercise: walking     Exercise (avg: min/session): 30-60     Times/week: 5  Current Problems (verified): 1)  Preventive Health Care  (ICD-V70.0) 2)  Intrinsic Asthma, Unspecified  (ICD-493.10) 3)  Hyperlipidemia  (ICD-272.4) 4)  Diabetes Mellitus, Type II  (ICD-250.00) 5)  Hypertension, Essential Nos  (ICD-401.9) 6)  Chest Pain  (ICD-786.50) 7)  Gerd  (ICD-530.81)  Current Medications (verified): 1)  Proventil Hfa 108 (90 Base) Mcg/act Aers (Albuterol Sulfate) .... Qid As Needed 2)  Advair Hfa 45-21 Mcg/act Aero (Fluticasone-Salmeterol) .... One Puff Daily 3)  Metformin Hcl 500 Mg Tabs (Metformin Hcl) ....  Take 1 Tablet By Mouth Two Times A Day With Meals 4)  Lisinopril-Hydrochlorothiazide 10-12.5 Mg Tabs (Lisinopril-Hydrochlorothiazide) .... Take 1 Tablet By Mouth Once A Day 5)  Aspir-Low 81 Mg Tbec (Aspirin) .... Take 1 Tablet By Mouth Once A Day With Meals For Heart Protection 6)  Onetouch Ultra System W/device Kit (Blood Glucose Monitoring Suppl) .... Check Blood Sugar Twice A Day Before Meals 7)  Glimepiride 4 Mg Tabs (Glimepiride) .... Take 1 Tablet By Mouth Two Times A Day 8)  Cvs Omeprazole 20 Mg Tbec (Omeprazole) .... Take 1 Tablet By Mouth Two Times A Day Before Meals  Allergies (verified): No Known Drug Allergies  Past History:  Risk Factors: Smoking Status: quit (09/02/2008) Packs/Day: 4 cig/week (09/02/2008)  Past medical, surgical, family and social histories (including risk factors) reviewed, and no changes noted (except as noted below).  Past Medical History: Reviewed history from 09/02/2008 and no changes required. GERD-remote history/ add protonix for cough. Chest pain- normal EKG Asthma, moderate Diabetes mellitus, type II Hypertension Hyperlipidemia  Past Surgical History: Reviewed history from 09/02/2008 and no changes required. Cholecystectomy in 2006 BTL in 1988 C-section in 1988 L ankle ORIF in 1992  Family History: Reviewed history from 09/02/2008 and no changes required. Father died from CAD at age of 16 y/o Mother died from Lung cancer at age 78 y/o  Social History: Reviewed history from 09/02/2008 and no changes required. Married, 5 children Unemployed Uninsured Education: 1 year of college ETOH: no Nikotine: 5 cig/week for 1 year. Quit in 1970 Drugs: no  Physical Exam  General:  alert and well-hydrated.   Mouth:  good dentition, pharynx pink and moist, and no erythema.   Neck:  supple and no masses.   Lungs:  normal respiratory effort, normal breath sounds, and no wheezes.   Heart:  normal rate, regular rhythm, and no murmur.     Abdomen:  soft and normal bowel sounds.   Msk:  no joint swelling.   Pulses:  Pedal pulses 2+/4 bilaterally Extremities:  trace left pedal edema and trace right pedal edema.   Neurologic:  alert & oriented X3.   Skin:  turgor normal and no rashes.   Psych:  Oriented X3, normally interactive, and good eye contact.     Impression & Recommendations:  Problem # 1:  DIABETES MELLITUS, TYPE II (ICD-250.00)  Patient has been changed from  Janumet to Metformin and Glimepiride 2 weeks ago due to financial reasons. Patient tolerates Metform and Glimepiride well --no side-effects; no sx of hyplglycemia. Requested for the patient to bring her glucometer with each visit. Take Glimepiride 4mg  one tablet TWO times a day (not once daily). Diet, foot care reviewed. Will recheck HgbA1C in 8 weeks. Her updated medication list for this problem includes:    Metformin Hcl 500 Mg Tabs (Metformin hcl) .Marland Kitchen... Take 1 tablet by mouth two times a day with meals    Lisinopril-hydrochlorothiazide 10-12.5 Mg Tabs (Lisinopril-hydrochlorothiazide) .Marland Kitchen... Take 1 tablet by mouth once a day    Aspir-low 81 Mg Tbec (Aspirin) .Marland Kitchen... Take 1 tablet by mouth once a day with meals for heart protection    Glimepiride 4 Mg Tabs (Glimepiride) .Marland Kitchen... Take 1 tablet by mouth two times a day  Orders: T-TSH ([id number]) DME Referral (DME)  Last Eye Exam: normal (06/03/2008) Reviewed HgBA1c results: 8.4 (09/02/2008)  Problem # 2:  HYPERTENSION, ESSENTIAL NOS (ICD-401.9)  Patient has been changed from Benicar-HCT to Zestoretic 2 weeks ago due to financial reasons. Patient denies any cough, edema or rash. Did not take her Zestoretic this am. Patient is strongly advised to adhere with treatment regimen as Rx-ed. Risks of HTN reviewed with the patient. Will follo wup with C-met in 6-8 weeks. Her updated medication list for this problem includes:    Lisinopril-hydrochlorothiazide 10-12.5 Mg Tabs (Lisinopril-hydrochlorothiazide) .Marland Kitchen... Take 1  tablet by mouth once a day  BP today: 166/83 Prior BP: 130/82 (09/02/2008)  Orders: T-Comprehensive Metabolic Panel ([id number])  Problem # 3:  INTRINSIC ASTHMA, UNSPECIFIED (ICD-493.10) Stable and controlled. Continue with current regimen. Her updated medication list for this problem includes:    Proventil Hfa 108 (90 Base) Mcg/act Aers (Albuterol sulfate) ..... Qid as needed    Advair Hfa 45-21 Mcg/act Aero (Fluticasone-salmeterol) ..... One puff daily  Problem # 4:  HYPERLIPIDEMIA (ICD-272.4) Patient is awaiting her [employer] care coverage. Patient is encouraged to have her labs drawn ASAP if possible. Low- fat and hig fiber-diet reviewed. Orders: T-Comprehensive Metabolic Panel [id number]) T-Lipid Profile [id number]) T-TSH [id number])  Complete Medication List: 1)  Proventil Hfa 108 (90 Base) Mcg/act Aers (Albuterol sulfate) .... Qid as needed 2)  Advair Hfa 45-21 Mcg/act Aero (Fluticasone-salmeterol) .... One puff daily 3)  Metformin Hcl 500 Mg Tabs (Metformin hcl) .... Take 1 tablet by mouth two times a day with meals 4)  Lisinopril-hydrochlorothiazide 10-12.5 Mg Tabs (Lisinopril-hydrochlorothiazide) .Marland KitchenMarland KitchenMarland Kitchen  Take 1 tablet by mouth once a day 5)  Aspir-low 81 Mg Tbec (Aspirin) .... Take 1 tablet by mouth once a day with meals for heart protection 6)  Onetouch Ultra System W/device Kit (Blood glucose monitoring suppl) .... Check blood sugar twice a day before meals 7)  Glimepiride 4 Mg Tabs (Glimepiride) .... Take 1 tablet by mouth two times a day 8)  Cvs Omeprazole 20 Mg Tbec (Omeprazole) .... Take 1 tablet by mouth two times a day before meals  Other Orders: T-CBC No Diff ([id number])  Patient Instructions: 1)  Please, take Glimiperide 4 mg one tablet TWO TIMES A DAY with meals. 2)  Please, take Lisinopril-HCTZ one tablet in the MORNING. 3)  Do not skip either your melas or medications. 4)  If you have any questions, please call our clinic. 5)  Follow up  in 8 weeks with Dr. Denton Meek in our office  Prevention & Chronic Care Immunizations   Influenza vaccine: Not documented   Influenza vaccine deferral: Deferred  (09/16/2008)    Tetanus booster: 09/02/2008: Td   Tetanus booster due: 09/03/2018    Pneumococcal vaccine: Not documented    H. zoster vaccine: Not documented   H. zoster vaccine deferral: Deferred  (09/16/2008)  Colorectal Screening   Hemoccult: Not documented   Hemoccult action/deferral: Ordered  (09/02/2008)    Colonoscopy: Not documented   Colonoscopy action/deferral: Deferred  (09/16/2008)  Other Screening   Pap smear: Refused  (01/01/2006)   Pap smear action/deferral: Refused  (09/16/2008)   Pap smear due: 09/03/2011    Mammogram: Normal  (12/01/2005)   Mammogram action/deferral: Ordered  (09/02/2008)   Mammogram due: 12/02/2006    DXA bone density scan: Not documented   DXA bone density action/deferral: Ordered  (09/02/2008)   DXA scan due: 08/2010    Smoking status: quit  (09/02/2008)  Diabetes Mellitus   HgbA1C: 8.4  (09/02/2008)    Eye exam: normal  (06/03/2008)   Eye exam due: 06/2009    Foot exam: yes  (09/02/2008)   Foot exam action/deferral: Do today   High risk foot: Not documented   Foot care education: Done  (09/02/2008)    Urine microalbumin/creatinine ratio: 6.3  (09/02/2008)    Diabetes flowsheet reviewed?: Yes   Progress toward A1C goal: Unchanged  Lipids   Total Cholesterol: Not documented   LDL: Not documented   LDL Direct: Not documented   HDL: Not documented   Triglycerides: Not documented    SGOT (AST): Not documented   SGPT (ALT): Not documented CMP ordered    Alkaline phosphatase: Not documented   Total bilirubin: Not documented    Lipid flowsheet reviewed?: Yes   Progress toward LDL goal: Unchanged  Hypertension   Last Blood Pressure: 166 / 83  (09/16/2008)   Serum creatinine: Not documented   Serum potassium Not documented CMP ordered     Hypertension  flowsheet reviewed?: Yes   Progress toward BP goal: Deteriorated  Self-Management Support :   Personal Goals (by the next clinic visit) :     Personal A1C goal: 7  (09/16/2008)     Personal blood pressure goal: 130/80  (09/16/2008)     Personal LDL goal: 100  (09/16/2008)    Patient will work on the following items until the next clinic visit to reach self-care goals:     Medications and monitoring: take my medicines every day, check my blood sugar, bring all of my medications to every visit, examine my feet every day  (09/16/2008)  Eating: eat foods that are low in salt  (09/16/2008)     Activity: take a 30 minute walk every day  (09/16/2008)     Other: attend a self-management class  (09/16/2008)     Home glucose monitoring frequency: 1 time daily  (09/16/2008)    Diabetes self-management support: Education handout, Written self-care plan, Referred for DM self-management training  (09/16/2008)   Diabetes care plan printed   Diabetes education handout printed    Hypertension self-management support: Written self-care plan  (09/16/2008)   Hypertension self-care plan printed.    Lipid self-management support: Written self-care plan  (09/16/2008)   Lipid self-care plan printed.   Nursing Instructions: Refer for diabetes self-management training (see order)    Diabetes Self Management Training Referral Patient Name: Alyssa Haynes Date Of Birth: 01/14/1946 MRN: [id number] Current Diagnosis:  PREVENTIVE HEALTH CARE (ICD-V70.0) INTRINSIC ASTHMA, UNSPECIFIED (ICD-493.10) HYPERLIPIDEMIA (ICD-272.4) DIABETES MELLITUS, TYPE II (ICD-250.00) HYPERTENSION, ESSENTIAL NOS (ICD-401.9) CHEST PAIN (ICD-786.50) GERD (ICD-530.81)     Management Training Needs:   Monitoring  Annual Follow-up MNT(2 hours/year) Please Specify change in Medical condition, treatment or diagnosis Hgb A1C is 8.4 (09/02/2008) Restrictions Regarding Exercise: None  Complicating Conditions:  HTN   Other  Asthma  Barriers:  Other  Financia strain   Appended Document: FU VISIT/DS     History of Present Illness: Adendum to and HPI --taking into  a consideration that the patient changed her HPI once examined in the room by Dr. Meredith Pel.  Two weeks follow up appointment: 1. DM, type 2. Last Hgb A1C 8.4 (09/12/2008). Changed from Janumet to Metformin and Glimepiride  on 09/02/2008 due to financial strain. Patient admits to not  taking any of her new medications. Denies any new symptoms. 2. HTN. Has been changed from [facility] to a Zestoretic 10-12.5 mg by mouth daily  on 09/02/2008 for the same reason. Patient does not take Zestoretic as was prescribed "b/c cannot afford it at the [facility]."  Allergies (verified): No Known Drug Allergies  Past History:  Past medical history reviewed for relevance to current acute and chronic problems.  Past Medical History: Reviewed history from 09/02/2008 and no changes required. GERD-remote history/ add protonix for cough. Chest pain- normal EKG Asthma, moderate Diabetes mellitus, type II Hypertension Hyperlipidemia  Social History: Reviewed history from 09/02/2008 and no changes required. Married, 5 children Unemployed Uninsured --does not qualify for an "[employer]' benefits. Education: 1 year of college ETOH: no Nikotine: 5 cig/week for 1 year. Quit in 1970 Drugs: no  Physical Exam  General:  alert.   Eyes:  pupils equal, pupils round, pupils reactive to light, and pupils react to accomodation.   Mouth:  pharynx pink and moist, no erythema, and no exudates.   Neck:  supple and no masses.   Lungs:  normal respiratory effort and normal breath sounds.   Heart:  normal rate, regular rhythm, and no murmur.   Abdomen:  soft and normal bowel sounds.   Msk:  normal ROM.   Pulses:  Pedal pulses 2+/4 bilaterally Extremities:  No edema bilaterally Neurologic:  alert & oriented X3.   Skin:  turgor normal and no rashes.    Psych:  Oriented X3.     Impression & Recommendations:  Problem # 1:  DIABETES MELLITUS, TYPE II (ICD-250.00) Patient is noncompliant with treatment paln due to a financial strain. Strongly adivsed to adhere with treatment regimen. Strongly advised to draw her C-met, TSH, CBC, and Lipids as discussed on 09/02/2008. Patient  verbalized understanding.  Her updated medication list for this problem includes:    Metformin Hcl 500 Mg Tabs (Metformin hcl) .Marland Kitchen... Take 1 tablet by mouth two times a day with meals    Lisinopril-hydrochlorothiazide 10-12.5 Mg Tabs (Lisinopril-hydrochlorothiazide) .Marland Kitchen... Take 1 tablet by mouth once a day    Aspir-low 81 Mg Tbec (Aspirin) .Marland Kitchen... Take 1 tablet by mouth once a day with meals for heart protection    Glimepiride 4 Mg Tabs (Glimepiride) .Marland Kitchen... Take 1 tablet by mouth two times a day  Last Eye Exam: normal (06/03/2008) Reviewed HgBA1c results: 8.4 (09/02/2008)  Problem # 2:  HYPERTENSION, ESSENTIAL NOS (ICD-401.9)  Deteriorated given patient's lack of adherance with a treatment plan. Risks of HTN reviewed with the patient. Srongly advised to adhere to regimen.  Start Zestoretic as Rx-ed today. Recheck BP in 1-2 weeks. Her updated medication list for this problem includes:    Lisinopril-hydrochlorothiazide 10-12.5 Mg Tabs (Lisinopril-hydrochlorothiazide) .Marland Kitchen... Take 1 tablet by mouth once a day  Orders: T-Basic Metabolic Panel [id number])  Prior BP: 166/83 (09/16/2008)  Problem # 3:  HYPERLIPIDEMIA (ICD-272.4) Unknown control status. Will await results of her Lipid panel. Low fat and high fiber diet reviewed with the patient. Orders: T-Lipid Profile ([id number])  Complete Medication List: 1)  Proventil Hfa 108 (90 Base) Mcg/act Aers (Albuterol sulfate) .... Qid as needed 2)  Advair Hfa 45-21 Mcg/act Aero (Fluticasone-salmeterol) .... One puff daily 3)  Metformin Hcl 500 Mg Tabs (Metformin hcl) .... Take 1 tablet by mouth two times a day with meals 4)   Lisinopril-hydrochlorothiazide 10-12.5 Mg Tabs (Lisinopril-hydrochlorothiazide) .... Take 1 tablet by mouth once a day 5)  Aspir-low 81 Mg Tbec (Aspirin) .... Take 1 tablet by mouth once a day with meals for heart protection 6)  Onetouch Ultra System W/device Kit (Blood glucose monitoring suppl) .... Check blood sugar twice a day before meals 7)  Glimepiride 4 Mg Tabs (Glimepiride) .... Take 1 tablet by mouth two times a day 8)  Cvs Omeprazole 20 Mg Tbec (Omeprazole) .... Take 1 tablet by mouth two times a day before meals  Other Orders: T-Hepatic Function [id number])   Prevention & Chronic Care Immunizations   Influenza vaccine: Not documented   Influenza vaccine deferral: Deferred  (09/16/2008)    Tetanus booster: 09/02/2008: Td   Tetanus booster due: 09/03/2018    Pneumococcal vaccine: Not documented    H. zoster vaccine: Not documented   H. zoster vaccine deferral: Deferred  (09/16/2008)  Colorectal Screening   Hemoccult: Not documented   Hemoccult action/deferral: Ordered  (09/02/2008)    Colonoscopy: Not documented   Colonoscopy action/deferral: Deferred  (09/16/2008)  Other Screening   Pap smear: Refused  (01/01/2006)   Pap smear action/deferral: Refused  (09/16/2008)   Pap smear due: 09/03/2011    Mammogram: Normal  (12/01/2005)   Mammogram action/deferral: Ordered  (09/02/2008)   Mammogram due: 12/02/2006    DXA bone density scan: Not documented   DXA bone density action/deferral: Ordered  (09/02/2008)   DXA scan due: 08/2010    Smoking status: quit  (09/02/2008)  Diabetes Mellitus   HgbA1C: 8.4  (09/02/2008)    Eye exam: normal  (06/03/2008)   Eye exam due: 06/2009    Foot exam: yes  (09/02/2008)   Foot exam action/deferral: Do today   High risk foot: Not documented   Foot care education: Done  (09/02/2008)    Urine microalbumin/creatinine ratio: 6.3  (09/02/2008)    Diabetes flowsheet reviewed?: Yes   Progress toward A1C  goal:  Unchanged  Lipids   Total Cholesterol: Not documented   Lipid panel action/deferral: Lipid Panel ordered   LDL: Not documented   LDL Direct: Not documented   HDL: Not documented   Triglycerides: Not documented    SGOT (AST): Not documented   BMP action: Ordered   SGPT (ALT): Not documented   Alkaline phosphatase: Not documented   Total bilirubin: Not documented    Lipid flowsheet reviewed?: No   Progress toward LDL goal: Unchanged  Hypertension   Last Blood Pressure: 166 / 83  (09/16/2008)   Serum creatinine: Not documented   BMP action: Ordered   Serum potassium Not documented   Basic metabolic panel due: 10/17/2008    Hypertension flowsheet reviewed?: Yes   Progress toward BP goal: Deteriorated   Hypertension comments: Not taking Zestoretic as prescribed  Self-Management Support :   Personal Goals (by the next clinic visit) :     Personal A1C goal: 7  (09/16/2008)     Personal blood pressure goal: 130/80  (09/16/2008)     Personal LDL goal: 100  (09/16/2008)    Patient will work on the following items until the next clinic visit to reach self-care goals:     Medications and monitoring: take my medicines every day, check my blood sugar, bring all of my medications to every visit, examine my feet every day  (09/16/2008)     Eating: eat foods that are low in salt  (09/16/2008)     Activity: take a 30 minute walk every day  (09/16/2008)     Other: attend a self-management class  (09/16/2008)     Home glucose monitoring frequency: 1 time daily  (09/16/2008)    Diabetes self-management support: Education handout, Written self-care plan, Referred for DM self-management training  (09/16/2008)    Hypertension self-management support: Written self-care plan  (09/16/2008)    Lipid self-management support: Written self-care plan  (09/16/2008)

## 2010-03-20 NOTE — Assessment & Plan Note (Signed)
Summary: REASSIGNED RE-ESTABLISH CHART HERE IN CLINIC/CFB   Vital Signs:  Patient profile:   65 year old female Height:      68 inches (172.72 cm) Weight:      186.6 pounds (84.82 kg) BMI:     28.47 Temp:     98.5 degrees F (36.94 degrees C) oral Pulse rate:   92 / minute BP sitting:   130 / 82  (right arm)  Vitals Entered By: Filomena Jungling NT II (September 02, 2008 1:41 PM) CC: NEE OLD PATIENTD REFILLS Is Patient Diabetic? Yes  Nutritional Status BMI of 25 - 29 = overweight CBG Result 163  Have you ever been in a relationship where you felt threatened, hurt or afraid?No   Does patient need assistance? Functional Status Self care Ambulation Normal   Diabetic Foot Exam Foot Inspection Is there a history of a foot ulcer?              No Is there a foot ulcer now?              No Can the patient see the bottom of their feet?          Yes Are the shoes appropriate in style and fit?          Yes Is there swelling or an abnormal foot shape?          No Are the toenails long?                No Are the toenails thick?                No Are the toenails ingrown?              No Is there heavy callous build-up?              No Is there pain in the calf muscle (Intermittent claudication) when walking?    NoIs there a claw toe deformity?              No Is there elevated skin temperature?            No Is there limited ankle dorsiflexion?            No Is there foot or ankle muscle weakness?            No  Diabetic Foot Care Education Patient educated on appropriate care of diabetic feet.     10-g (5.07) Semmes-Weinstein Monofilament Test Performed by: Filomena Jungling NT II          Right Foot          Left Foot Site 1         normal         normal Site 2         normal         normal Site 3         normal         normal Site 4         normal         normal Site 5         normal         normal Site 6         normal         normal Site 7         normal         normal Site 8  normal         normal Site 9         normal         normal  Impression      normal         normal   CC:  NEE OLD PATIENTD REFILLS.  History of Present Illness: Here to reestablish her health care. Patient lost her medical insurance and was unable to continue her care under her PCP Dr. Renae Gloss. 1.Needs refills on her meds. 2. DM, type 2 --takes Janumet, Gliperimide and  Benicar/HTC. No ASA. Last labs drawn one months ago and "were fine" per patient's report. Has had an ophtalmology visit 4 months ago and was without any abnormalities per patient. 3. Asthma -- mild exacerbations with wheezing once every 6 months. Uses Proventil HFA on a PRN basis. Patient does not take her Advair discus  because of a poor after-taste. Does not use a flow-meter.  Preventive Screening-Counseling & Management  Alcohol-Tobacco     Alcohol drinks/day: 0     Smoking Status: quit     Packs/Day: 4 cig/week     Year Quit: 1970  Caffeine-Diet-Exercise     Caffeine use/day: cups of coffee     Nutrition Referrals: yes     Does Patient Exercise: yes     Type of exercise: walking     Exercise (avg: min/session): 30-60     Times/week: 5  Problems Prior to Update: 1)  Hyperlipidemia  (ICD-272.4) 2)  Diabetes Mellitus, Type II  (ICD-250.00) 3)  Hypertension, Essential Nos  (ICD-401.9) 4)  Dry Cough  (ICD-786.2) 5)  Chest Pain  (ICD-786.50) 6)  Gerd  (ICD-530.81)  Current Problems (verified): 1)  Hyperlipidemia  (ICD-272.4) 2)  Diabetes Mellitus, Type II  (ICD-250.00) 3)  Hypertension, Essential Nos  (ICD-401.9) 4)  Dry Cough  (ICD-786.2) 5)  Chest Pain  (ICD-786.50) 6)  Gerd  (ICD-530.81)  Medications Prior to Update: 1)  Metformin Hcl 1000 Mg Tabs (Metformin Hcl) .... Take One Tablet By Mouth Every Morning. 2)  Lisinopril 20 Mg Tabs (Lisinopril) .... Take One Tablet By Mouth Every Day. 3)  Aspirin 81 Mg Tabs (Aspirin) .... Take One Tablet By Mouth Every Day. 4)  Protonix 40 Mg Tbec (Pantoprazole  Sodium) .... Take One Tablet By Mouth Evey Day. 5)  Centrum Silver  Tabs (Multiple Vitamins-Minerals) .... Use As Directed. 6)  Glucotrol 5 Mg Tabs (Glipizide) .... Take One Tablet By Mouth Every Day. 7)  Vytorin 10-40 Mg Tabs (Ezetimibe-Simvastatin) .... Use As Directed. 8)  Nitroglycerin 0.4 Mg Subl (Nitroglycerin) .... Q68minx3 As Needed For Chest Pain  Current Medications (verified): 1)  Proventil Hfa 108 (90 Base) Mcg/act Aers (Albuterol Sulfate) .... Qid As Needed 2)  Advair Hfa 45-21 Mcg/act Aero (Fluticasone-Salmeterol) .... One Puff Daily 3)  Metformin Hcl 500 Mg Tabs (Metformin Hcl) .... Take 1 Tablet By Mouth Two Times A Day With Meals 4)  Lisinopril-Hydrochlorothiazide 10-12.5 Mg Tabs (Lisinopril-Hydrochlorothiazide) .... Take 1 Tablet By Mouth Once A Day 5)  Aspir-Low 81 Mg Tbec (Aspirin) .... Take 1 Tablet By Mouth Once A Day With Meals For Heart Protection 6)  Onetouch Ultra System W/device Kit (Blood Glucose Monitoring Suppl) .... Check Blood Sugar Twice A Day Before Meals 7)  Glimepiride 4 Mg Tabs (Glimepiride) .... Take 1 Tablet By Mouth Two Times A Day 8)  Cvs Omeprazole 20 Mg Tbec (Omeprazole) .... Take 1 Tablet By Mouth Two Times A Day Before Meals  Allergies (verified): No  Known Drug Allergies  Past History:  Past medical, surgical, family and social histories (including risk factors) reviewed, and no changes noted (except as noted below).  Past Medical History: GERD-remote history/ add protonix for cough. Chest pain- normal EKG Asthma, moderate Diabetes mellitus, type II Hypertension Hyperlipidemia  Past Surgical History: Cholecystectomy in 2006 BTL in 1988 C-section in 1988 L ankle ORIF in 1992  Family History: Reviewed history and no changes required. Father died from CAD at age of 64 y/o Mother died from Lung cancer at age 45 y/o  Social History: Reviewed history and no changes required. Married, 5 children Unemployed Uninsured Education: 1 year  of college ETOH: no Nikotine: 5 cig/week for 1 year. Quit in 1970 Drugs: noPacks/Day:  4 cig/week Caffeine use/day:  cups of coffee Does Patient Exercise:  yes  Review of Systems  The patient denies anorexia, fever, weight loss, weight gain, vision loss, decreased hearing, hoarseness, chest pain, syncope, dyspnea on exertion, peripheral edema, prolonged cough, headaches, hemoptysis, abdominal pain, melena, hematochezia, severe indigestion/heartburn, hematuria, incontinence, genital sores, muscle weakness, suspicious skin lesions, transient blindness, difficulty walking, depression, unusual weight change, abnormal bleeding, enlarged lymph nodes, angioedema, breast masses, and testicular masses.   General:  Denies chills, fatigue, fever, loss of appetite, malaise, sleep disorder, sweats, weakness, and weight loss. Eyes:  Denies blurring, discharge, double vision, eye irritation, eye pain, halos, itching, light sensitivity, red eye, vision loss-1 eye, and vision loss-both eyes. ENT:  Denies decreased hearing, difficulty swallowing, ear discharge, earache, hoarseness, nosebleeds, ringing in ears, and sore throat. CV:  Denies bluish discoloration of lips or nails, chest pain or discomfort, difficulty breathing at night, difficulty breathing while lying down, fainting, fatigue, leg cramps with exertion, lightheadness, near fainting, palpitations, shortness of breath with exertion, swelling of feet, swelling of hands, and weight gain. Resp:  Complains of cough and wheezing; denies chest discomfort, chest pain with inspiration, coughing up blood, excessive snoring, hypersomnolence, morning headaches, pleuritic, shortness of breath, and sputum productive; wheezing at night --once every 6 months.. GI:  Denies abdominal pain, bloody stools, change in bowel habits, constipation, dark tarry stools, diarrhea, excessive appetite, gas, hemorrhoids, indigestion, loss of appetite, nausea, vomiting, vomiting blood, and  yellowish skin color; Colonoscopy in 2002 --WNL.  Physical Exam  General:  alert, well-nourished, and well-hydrated.   Head:  no abnormalities observed.   Eyes:  pupils equal, pupils round, pupils reactive to light, pupils react to accomodation, and corneas and lenses clear.   Ears:  R ear normal and L ear normal.   Nose:  no external deformity and no nasal discharge.   Mouth:  pharynx pink and moist, no erythema, and no exudates.   Neck:  supple and no masses.   Lungs:  normal respiratory effort and normal breath sounds.   Heart:  normal rate, regular rhythm, and no murmur.   Abdomen:  soft, non-tender, normal bowel sounds, no masses, and no hepatomegaly.   Msk:  normal ROM and no joint deformities.   Pulses:  Pedal pulses 2+/4 bialterally Extremities:  No clubbing, cyanosis, edema, or deformity noted with normal full range of motion of all joints.   Neurologic:  alert & oriented X3, cranial nerves II-XII intact, strength normal in all extremities, sensation intact to light touch, sensation intact to pinprick, DTRs symmetrical and normal, finger-to-nose normal, and heel-to-shin normal.   Skin:  turgor normal, color normal, and no rashes.   Cervical Nodes:  no anterior cervical adenopathy.   Psych:  Oriented X3, memory  intact for recent and remote, normally interactive, good eye contact, not anxious appearing, and not depressed appearing.    Diabetes Management Exam:    Foot Exam (with socks and/or shoes not present):       Sensory-Pinprick/Light touch:          Left medial foot (L-4): normal          Left dorsal foot (L-5): normal          Left lateral foot (S-1): normal          Right medial foot (L-4): normal          Right dorsal foot (L-5): normal          Right lateral foot (S-1): normal       Sensory-Monofilament:          Left foot: normal          Right foot: normal       Inspection:          Left foot: normal          Right foot: normal       Nails:          Left foot:  normal          Right foot: normal    Eye Exam:       Eye Exam done elsewhere          Date: 06/03/2008          Results: normal          Done by: Dr. Elmer Picker   Impression & Recommendations:  Problem # 1:  DIABETES MELLITUS, TYPE II (ICD-250.00) Control status is likely poor given today's HgbA1C of 8.4. Due to lack of insurance and a financial strain, will revise her medication regimen. Referred for DME with Ms. Victory Dakin. Glucometr UltraTouch I given to the patient--use demonstrated. Diet, exercise and foot care discussed with the patient. Will recheck her b-met and HgbA1C in 8 weeks. Orders: T-Urine Microalbumin w/creat. ratio (16109 / [id number]) DME Referral (DME)  The following medications were removed from the medication list:    Metformin Hcl 1000 Mg Tabs (Metformin hcl) .Marland Kitchen... Take one tablet by mouth every morning.    Lisinopril 20 Mg Tabs (Lisinopril) .Marland Kitchen... Take one tablet by mouth every day.    Aspirin 81 Mg Tabs (Aspirin) .Marland Kitchen... Take one tablet by mouth every day.    Glucotrol 5 Mg Tabs (Glipizide) .Marland Kitchen... Take one tablet by mouth every day. Her updated medication list for this problem includes:    Metformin Hcl 500 Mg Tabs (Metformin hcl) .Marland Kitchen... Take 1 tablet by mouth two times a day with meals    Lisinopril-hydrochlorothiazide 10-12.5 Mg Tabs (Lisinopril-hydrochlorothiazide) .Marland Kitchen... Take 1 tablet by mouth once a day    Aspir-low 81 Mg Tbec (Aspirin) .Marland Kitchen... Take 1 tablet by mouth once a day with meals for heart protection    Glimepiride 4 Mg Tabs (Glimepiride) .Marland Kitchen... Take 1 tablet by mouth two times a day  Reviewed HgBA1c results: 8.4 (09/02/2008)  Problem # 2:  HYPERTENSION, ESSENTIAL NOS (ICD-401.9)  As mentioned above, will revise her medication regimen due to lack of medical insurance. Will continue to monitor her BP readings and adjust her medications if necessary. The following medications were removed from the medication list:    Benicar 20/12.5.... Take one tablet by mouth  every day. Her updated medication list for this problem includes:    Lisinopril-hydrochlorothiazide 10-12.5 Mg Tabs (Lisinopril-hydrochlorothiazide) .Marland Kitchen... Take 1 tablet  by mouth once a day  BP today: 130/82  Problem # 3:  INTRINSIC ASTHMA, UNSPECIFIED (ICD-493.10) Patient is stable. Will change from Advair discus to [facility]  in order to observe whether the medicine's poor after-taste might be resolved. Her updated medication list for this problem includes:    Proventil Hfa 108 (90 Base) Mcg/act Aers (Albuterol sulfate) ..... Qid as needed    Advair Hfa 45-21 Mcg/act Aero (Fluticasone-salmeterol) ..... One puff daily  Problem # 4:  GERD (ICD-530.81) Due to financial strain, will change from Acifex to omeprazole. Avoid spicy, acidy, heavy or late-night  meals.  The following medications were removed from the medication list:    Her updated medication list for this problem includes:    Cvs Omeprazole 20 Mg Tbec (Omeprazole) .Marland Kitchen... Take 1 tablet by mouth two times a day before meals  Problem # 5:  PREVENTIVE HEALTH CARE (ICD-V70.0) Hemocult cards given to the patient. Safety/use of a seat belt discussed. CV exercise reviewed.  Complete Medication List: 1)  Proventil Hfa 108 (90 Base) Mcg/act Aers (Albuterol sulfate) .... Qid as needed 2)  Advair Hfa 45-21 Mcg/act Aero (Fluticasone-salmeterol) .... One puff daily 3)  Metformin Hcl 500 Mg Tabs (Metformin hcl) .... Take 1 tablet by mouth two times a day with meals 4)  Lisinopril-hydrochlorothiazide 10-12.5 Mg Tabs (Lisinopril-hydrochlorothiazide) .... Take 1 tablet by mouth once a day 5)  Aspir-low 81 Mg Tbec (Aspirin) .... Take 1 tablet by mouth once a day with meals for heart protection 6)  Onetouch Ultra System W/device Kit (Blood glucose monitoring suppl) .... Check blood sugar twice a day before meals 7)  Glimepiride 4 Mg Tabs (Glimepiride) .... Take 1 tablet by mouth two times a day 8)  Cvs Omeprazole 20 Mg Tbec (Omeprazole) .... Take 1  tablet by mouth two times a day before meals  Other Orders: T- Capillary Blood Glucose (09381) T-Hgb A1C (in-house) ([id number]) T-PAP [facility]) [phone]) Mammogram (Screening) (Mammo) Dexa scan (Dexa scan) T-Hemoccult Card-Multiple (take home) (93810) Tetanus Toxoid w/Dx (17510) Admin 1st Vaccine (25852)   Patient Instructions: 1)  We have changed your prescriptions. 2)  NEW: 3)  Omeprazole --instead of acifex 4)  Metformin --instead of Janumet 5)  Aspirin 81 mg daily with meals for heart protection 6)  Lisinopril/HTZ --instead of Benicar 7)  CHANGED: 8)  Advair discus was changed to a puffer 9)  We increased Gliperimide to 4 mg one tablet TWICE  day. 10)  You received a tetanus booster today. 11)  Please, follow up with Dr. Denton Meek at our clinic in 2 weeks. 12)  Please, follow up with Ms. Jamison Neighbor for a diabetic teaching. Prescriptions: [facility] OMEPRAZOLE 20 MG TBEC (OMEPRAZOLE) Take 1 tablet by mouth two times a day before meals Brand medically necessary #60 x 3   Entered and Authorized by:   Deatra Robinson MD   Signed by:   Deatra Robinson MD on 09/02/2008   Method used:   [facility] to        [facility]  [address] [phone]* (retail)       [address]       [place], [place]  [id number]       Ph: [phone] or [phone]       Fax: [phone]   RxID:   [id number] GLIMEPIRIDE 4 MG TABS (GLIMEPIRIDE) Take 1 tablet by mouth two times a day Brand medically necessary #60 x 3   Entered and Authorized  by:   Deatra Robinson MD   Signed by:   Deatra Robinson MD on 09/02/2008   Method used:   [facility] to        [facility] [phone]* (retail)       [address]       [place], [place]  [address]       Ph: [phone] or [phone]       Fax: [phone]   RxID:   [id number] [facility] ULTRA SYSTEM W/DEVICE KIT (BLOOD GLUCOSE MONITORING SUPPL) check blood sugar twice a day before meals  #1 x  0   Entered and Authorized by:   Deatra Robinson MD   Signed by:   Deatra Robinson MD on 09/02/2008   Method used:   [facility]ically to        [facility]  [address] [phone]* (retail)       [address]       [place], [place]  [address]       Ph: [phone] or [phone]       Fax: [phone]   RxID:   [id number] ASPIR-LOW 81 MG TBEC (ASPIRIN) Take 1 tablet by mouth once a day with meals for heart protection Brand medically necessary #30 x 11   Entered and Authorized by:   Deatra Robinson MD   Signed by:   Deatra Robinson MD on 09/02/2008   Method used:   [facility]ronically to        [facility]  [address] [phone]* (retail)       [address]       [place], [place]  [address]       Ph: [phone] or [phone]       Fax: [phone]   RxID:   [id number] LISINOPRIL-HYDROCHLOROTHIAZIDE 10-12.5 MG TABS (LISINOPRIL-HYDROCHLOROTHIAZIDE) Take 1 tablet by mouth once a day Brand medically necessary #30 x 3   Entered and Authorized by:   Deatra Robinson MD   Signed by:   Deatra Robinson MD on 09/02/2008   Method used:   [facility]ronically to        [facility]  [address] [phone]* (retail)       [address]       [place], [place]  [address]       Ph: [phone] or [phone]       Fax: [phone]   RxID:   [id number] METFORMIN HCL 500 MG TABS (METFORMIN HCL) Take 1 tablet by mouth two times a day with meals Brand medically necessary #60 x 3   Entered and Authorized by:   Deatra Robinson MD   Signed by:   Deatra Robinson MD on 09/02/2008   Method used:   Electronically to        [facility]  [address] [phone]* (retail)       [address]       [place], [place]  [address]       Ph: [phone] or [phone]       Fax: [phone]   RxID:   [id number] ADVAIR HFA 45-21 MCG/ACT AERO (FLUTICASONE-SALMETEROL) one puff daily Brand medically necessary #1 x 3    Entered and Authorized by:   Deatra Robinson MD   Signed by:   Deatra Robinson MD  on 09/02/2008   Method used:   Electronically to        [facility]  [address] [phone]* (retail)       [address]       [place], [place]  [address]       Ph: [phone] or [phone]       Fax: [phone]   RxID:   [id number] PROVENTIL HFA 108 (90 BASE) MCG/ACT AERS (ALBUTEROL SULFATE) qid as needed Brand medically necessary #1 x 3   Entered and Authorized by:   Deatra Robinson MD   Signed by:   Deatra Robinson MD on 09/02/2008   Method used:   [facility]ronically to        [facility]  [address] [phone]* (retail)       [address]       [place], [place]  [address]       Ph: [phone] or [phone]       Fax: [phone]   RxID:   [id number]   Prevention & Chronic Care Immunizations   Influenza vaccine: Not documented    Tetanus booster: 09/02/2008: Td   Tetanus booster due: 09/03/2018    Pneumococcal vaccine: Not documented    H. zoster vaccine: Not documented  Colorectal Screening   Hemoccult: Not documented   Hemoccult action/deferral: Ordered  (09/02/2008)    Colonoscopy: Not documented  Other Screening   Pap smear: Refused  (01/01/2006)   Pap smear action/deferral: Ordered  (09/02/2008)   Pap smear due: 09/03/2011    Mammogram: Normal  (12/01/2005)   Mammogram action/deferral: Ordered  (09/02/2008)   Mammogram due: 12/02/2006    DXA bone density scan: Not documented   DXA bone density action/deferral: Ordered  (09/02/2008)   DXA scan due: 08/2010     Smoking status: quit  (09/02/2008)  Diabetes Mellitus   HgbA1C: 8.4  (09/02/2008)    Eye exam: normal  (06/03/2008)   Eye exam due: 06/2009    Foot exam: yes  (09/02/2008)   Foot exam action/deferral: Do today   High risk foot: Not documented   Foot care education: Done  (09/02/2008)    Urine microalbumin/creatinine ratio: Not  documented  Hypertension   Last Blood Pressure: 130 / 82  (09/02/2008)   Serum creatinine: Not documented   Serum potassium Not documented  Lipids   Total Cholesterol: Not documented   LDL: Not documented   LDL Direct: Not documented   HDL: Not documented   Triglycerides: Not documented    SGOT (AST): Not documented   SGPT (ALT): Not documented   Alkaline phosphatase: Not documented   Total bilirubin: Not documented  Self-Management Support :    Diabetes self-management support: Not documented    Hypertension self-management support: Not documented    Lipid self-management support: Not documented    Nursing Instructions: Diabetic foot exam today Give Td booster today Pap smear today Schedule screening mammogram (see order) Schedule screening DXA bone density scan (see order) Provide Hemoccult cards with instructions (see order)    Prevention & Chronic Care Immunizations   Influenza vaccine: Not documented    Tetanus booster: 09/02/2008: Td   Tetanus booster due: 09/03/2018    Pneumococcal vaccine: Not documented    H. zoster vaccine: Not documented  Colorectal Screening   Hemoccult: Not documented   Hemoccult action/deferral: Ordered  (09/02/2008)    Colonoscopy: Not documented  Other Screening  Pap smear: Refused  (01/01/2006)   Pap smear action/deferral: Ordered  (09/02/2008)   Pap smear due: 09/03/2011    Mammogram: Normal  (12/01/2005)   Mammogram action/deferral: Ordered  (09/02/2008)   Mammogram due: 12/02/2006    DXA bone density scan: Not documented   DXA bone density action/deferral: Ordered  (09/02/2008)   DXA scan due: 08/2010    Smoking status: quit  (09/02/2008)  Diabetes Mellitus   HgbA1C: 8.4  (09/02/2008)    Eye exam: normal  (06/03/2008)   Eye exam due: 06/2009    Foot exam: yes  (09/02/2008)   Foot exam action/deferral: Do today   High risk foot: Not documented   Foot care education: Done  (09/02/2008)    Urine  microalbumin/creatinine ratio: Not documented  Lipids   Total Cholesterol: Not documented   LDL: Not documented   LDL Direct: Not documented   HDL: Not documented   Triglycerides: Not documented    SGOT (AST): Not documented   SGPT (ALT): Not documented   Alkaline phosphatase: Not documented   Total bilirubin: Not documented  Hypertension   Last Blood Pressure: 130 / 82  (09/02/2008)   Serum creatinine: Not documented   Serum potassium Not documented  Self-Management Support :    Diabetes self-management support: Not documented    Hypertension self-management support: Not documented    Lipid self-management support: Not documented    Nursing Instructions: Give Td booster today    Laboratory Results   Blood Tests   Date/Time Received: .Krystal Eaton ([facility])  September 02, 2008 2:01 PM  Date/Time Reported: .Krystal Eaton ([facility])  September 02, 2008 2:01 PM   HGBA1C: 8.4%   (Normal Range: Non-Diabetic - 3-6%   Control Diabetic - 6-8%) CBG Random:: 163      Tetanus/Td Vaccine    Vaccine Type: Td    Site: right deltoid    Mfr: Sanofi Pasteur    Dose: 0.5 ml    Route: IM    Given by: Jennet Maduro RN    Exp. Date: 04/22/2010    Lot #: [id number]

## 2010-03-20 NOTE — Consult Note (Signed)
Summary: [facility]  [facility]   Imported By: Florinda Marker 09/12/2008 14:47:59  _____________________________________________________________________  External Attachment:    Type:   Image     Comment:   External Document

## 2010-03-20 NOTE — Progress Notes (Signed)
Summary: SMBG better, but still higher than goal/dmr  Phone Note Call from Patient   Caller: Patient Summary of Call: Patient calling per our previous discussion: wondering if she should increase her metformin, is often nauseated, but it  is tolerable.  lowest feels that increased dose has decreased CBGs  ~ 20 points, but still not at goal Lowest CBG: 167,  we discussed- she'll decided to wait on further increase until she sees dotor in April - will try to see if she can get an earlier appointment. l says she was on janumet before without side effects and good blood sugar levels, Discussed that doubtful, but maybe ER metformin might help- she since it is generic and she is currenlty uninsured. she will discuss options with physician. Initial call taken by: Jamison Neighbor RD,CDE,  May 08, 2009 3:58 PM

## 2010-03-20 NOTE — Progress Notes (Signed)
Summary: phone/gg  Phone Note Call from Patient   Caller: Patient Summary of Call: Pt called and has been  on lisinopril for 3 weeks.  She has developed a dry cough.  Can you change to something else.   Can you change to something on the $4 list at [facility]??  Initial call taken by: Merrie Roof RN,  September 19, 2008 3:16 PM  Additional Follow-up for Phone Call Additional follow up Details #1::        Pt informed    New/Updated Medications: HYDROCHLOROTHIAZIDE 25 MG TABS (HYDROCHLOROTHIAZIDE) Take 1 tablet by mouth once a day in am Prescriptions: HYDROCHLOROTHIAZIDE 25 MG TABS (HYDROCHLOROTHIAZIDE) Take 1 tablet by mouth once a day in am  #30 x 3   Entered and Authorized by:   Deatra Robinson MD   Signed by:   Merrie Roof RN on 09/20/2008   Method used:   Electronically to        [facility] (retail)       [address]       [place], [place]  [address]       Ph: [phone]       Fax: [phone]   RxID:   [id number]

## 2010-03-20 NOTE — Progress Notes (Signed)
Summary: diabetes support/new to insulin follow up/dmr  Phone Note Outgoing Call   Call placed by: Jamison Neighbor RD,CDE,  Jun 30, 2009 1:46 PM Summary of Call: called to follow up - new to insulin:"doing pretty good-. ~ 130 blood sugars until recent antibiotics, now fluctuating, but nto nearly as high as it was before insulin." Discussed prices and quantities of insulin - for 5 pens cash (200$)   ~ 56 dollars/month on her dose, vial  ~ 100 dollars a month if discards according to recommendations. Patient with no other questions or concerns, encourage dher to call anytime with questions/concerns.

## 2010-03-28 NOTE — Progress Notes (Signed)
Summary: Update MAMMO results  Phone Note Other Incoming   Summary of Call: Last [facility] per [facility] Oct. 7,2011 Burke Keels  March 19, 2010 1:36 PM

## 2010-04-17 ENCOUNTER — Other Ambulatory Visit: Payer: Self-pay | Admitting: Internal Medicine

## 2010-04-17 ENCOUNTER — Ambulatory Visit (INDEPENDENT_AMBULATORY_CARE_PROVIDER_SITE_OTHER): Payer: Self-pay | Admitting: Internal Medicine

## 2010-04-17 ENCOUNTER — Encounter: Payer: Self-pay | Admitting: Internal Medicine

## 2010-04-17 ENCOUNTER — Ambulatory Visit (HOSPITAL_COMMUNITY)
Admission: RE | Admit: 2010-04-17 | Discharge: 2010-04-17 | Disposition: A | Discharge status: Home / Self Care | Payer: Self-pay | Source: Ambulatory Visit | Attending: Internal Medicine | Admitting: Internal Medicine

## 2010-04-17 VITALS — BP 125/77 | HR 99 | Temp 98.0°F | Ht 67.5 in | Wt 181.7 lb

## 2010-04-17 DIAGNOSIS — M25579 Pain in unspecified ankle and joints of unspecified foot: Secondary | ICD-10-CM

## 2010-04-17 DIAGNOSIS — M25572 Pain in left ankle and joints of left foot: Secondary | ICD-10-CM

## 2010-04-17 DIAGNOSIS — E119 Type 2 diabetes mellitus without complications: Secondary | ICD-10-CM

## 2010-04-17 DIAGNOSIS — I10 Essential (primary) hypertension: Secondary | ICD-10-CM

## 2010-04-17 LAB — POCT GLYCOSYLATED HEMOGLOBIN (HGB A1C): Hemoglobin A1C: 8

## 2010-04-17 LAB — GLUCOSE, CAPILLARY: Glucose-Capillary: 149 mg/dL — ABNORMAL HIGH (ref 70–99)

## 2010-04-17 MED ORDER — INSULIN GLARGINE 100 UNIT/ML ~~LOC~~ SOLN: SUBCUTANEOUS | Status: DC

## 2010-04-17 NOTE — Patient Instructions (Signed)
Please, take all your medications as prescribed. Please, note that I increased your dose of Solostar insulin up to 16 Units before bedtime. Please, call with any questions; and follow up in 3 months or sooner if needed.

## 2010-04-17 NOTE — Progress Notes (Signed)
  Subjective:    Patient ID: Alyssa Haynes, female    DOB: April 04, 1945, 65 y.o.   MRN: [id number]  HPI patient presents for : #1 diabetes mellitus type 2 patient reports compliance with insulin regimen. Denies any hypoglycemic events. Patient is compliant with the diet regimen. Denies any concerns. #2 left ankle pain status post twisting her ankle while walking about a week ago. Patient states that ankle is swollen and that pain is worse with bearing weight and better with elevation. Patient takes anti-inflammatory medications over-the-counter with moderate relief. Patient denies any calf pain chest pain palpitations shortness of breath fever chills or sweats. #3 preventative health care. Patient is up-to-date on her mammogram and    Review of Systems  review of systems is per history of present illness    Objective:   Physical Exam  Constitutional: She is oriented to person, place, and time. She appears well-developed.  HENT:  Right Ear: External ear normal.  Left Ear: External ear normal.  Nose: Nose normal.  Mouth/Throat: Oropharynx is clear and moist.  Eyes: Conjunctivae are normal. Pupils are equal, round, and reactive to light.  Neck: Normal range of motion. No JVD present. No thyromegaly present.  Cardiovascular: Normal rate, regular rhythm, normal heart sounds and intact distal pulses.   Pulmonary/Chest: Effort normal and breath sounds normal.  Abdominal: Soft. Bowel sounds are normal. She exhibits no distension. There is no tenderness. There is no rebound.  Musculoskeletal: Normal range of motion.  Lymphadenopathy:    She has no cervical adenopathy.  Neurological: She is alert and oriented to person, place, and time. She has normal reflexes. No cranial nerve deficit.  Skin: Skin is warm.  Psychiatric: She has a normal mood and affect. Her behavior is normal.          Assessment & Plan:   #1 diabetes mellitus type 2. Improved hemoglobin A1c. However still sub-optimal  control. Will increase Solostar Insulin from 14 units up to 16 units. Prescription sent to [facility].    #2  Left ankle pain status post trauma with a history of left ankle fracture status post ORIF in 1992. X-ray of left ankle ordered.   #3 hypertension controlled we'll continue with current regimen. Patient is to followup within 3 months or sooner if necessary.

## 2010-04-19 ENCOUNTER — Telehealth: Payer: Self-pay | Admitting: *Deleted

## 2010-04-19 NOTE — Telephone Encounter (Signed)
Pt calls and states at her visit 2/28 her insulin dose was changed by increasing it by 1 unit, her blood sugar has dropped greatly she states, from low 200's to low 100's. She is quite concerned, i have transferred her call to donna r. And ask her to leave a message for a callback. She can be reached at the ph# listed on her chart

## 2010-04-19 NOTE — Telephone Encounter (Signed)
Was on Janumet for past 6 months until last week from Andi Devon, MD, husband lost insurance again so couldn't afford it- 200 dollars, so started on metformin and glimiperide since she had active Rxs at pharmacy.  Took metformin 1000 mg and glimiperide 4 mg last night ~ 8 PM.  Took 16 units last night before bed. Blood sugar was 220 prior to breakfast, but after coffee and Truvia. Took metformin and glimiperide. Ate smoothie- 1 cup fruit, banana, water 2 scoops protein powder  2 hours later 129 and a bit nauseated- wanted to know if that was because her blood sugar had dropped so much?.And also wanted to know if 1 unit insulin would make it drop that ,much?  Feeling a bit queasy but better now and blood sugar is 139. She just started back to self monitoring so is not sure what her blood sugars were before today  Discussed that Janumet 50/1000 twice daily vs metformin 1000mg   and glimiperide 4 mg twice daily are not the same medicines (she thought they were). We agreed that she would continue on the medicines she is on now since her blood sugars seem to have improved and it is much more affordable and continue to self monitor several times a day until she can establish a pattern on this new regimen.  Will route note to her doctor .

## 2010-05-08 LAB — GLUCOSE, CAPILLARY: Glucose-Capillary: 251 mg/dL — ABNORMAL HIGH (ref 70–99)

## 2010-05-11 LAB — GLUCOSE, CAPILLARY: Glucose-Capillary: 215 mg/dL — ABNORMAL HIGH (ref 70–99)

## 2010-05-25 LAB — GLUCOSE, CAPILLARY: Glucose-Capillary: 212 mg/dL — ABNORMAL HIGH (ref 70–99)

## 2010-05-27 LAB — GLUCOSE, CAPILLARY: Glucose-Capillary: 163 mg/dL — ABNORMAL HIGH (ref 70–99)

## 2010-06-24 ENCOUNTER — Encounter: Payer: Self-pay | Admitting: Internal Medicine

## 2010-07-06 NOTE — H&P (Signed)
NAMEGWENYTH, Alyssa Haynes             ACCOUNT NO.:  [id number]   MEDICAL RECORD NO.:  [id number]          PATIENT TYPE:  INP   LOCATION:  3729                         FACILITY:  [facility]   PHYSICIAN:  Lonia Blood, M.D.DATE OF BIRTH:  [birth year]   DATE OF ADMISSION:  03/02/2005  DATE OF DISCHARGE:                                HISTORY & PHYSICAL   PRIMARY CARE PHYSICIAN:  Georgianne Fick, M.D.   CHIEF COMPLAINT:  Chest pressure.   HISTORY OF PRESENT ILLNESS:  Alyssa Haynes is a 65 year old female  with a positive family history for coronary artery disease, diabetes  mellitus, hypertension, and hyperlipidemia who does not smoke.  She  presented to the hospital on the date of admission with complaints of chest  pressure.  The pain actually began last Monday (it is currently Saturday),  when the patient had acute onset of a pressure type sensation in her back  between her shoulders.  This lasted for approximately an hour on Monday and  then went away.  Tuesday morning the patient was up working in her home when  she began to develop a sense of severe pressure in her chest.  This was  associated with diaphoresis.  This pain lasted approximately 20-30 minutes.  It occurred while she was up walking around in her home and resolved when  she rested.  The pain did not recur on Tuesday.  The following day,  Wednesday, the patient awoke and had the same soreness/heaviness in her  chest all day long.  It was certainly worse when she exerted herself and did  seem to get better when she rested but it never completely resolved.  Thursday and Friday the patient was pain free but again she began to develop  heaviness in her chest this morning.  Her husband had returned from an out  of town trip and when she explained her symptoms to him, he decided to  encourage her to come to the emergency room.  On further history, the  patient does admit she has been developing significant dyspnea on  exertion  above her baseline for the last two weeks.  She says she just doesn't have  the energy she used to have.  She has had no severe shortness of breath at  rest.  There has been some diaphoresis associated with her episodes of chest  discomfort/pressure.  There has been no nausea or vomiting.  There has been  no increased flatulence/gas though the patient has recently changed to a  pure vegetarian diet.   REVIEW OF SYSTEMS:  Comprehensive review of systems is unremarkable with the  exception of positive elements noted in the history of present illness  above.   PAST MEDICAL HISTORY:  1.  Hypertension.  2.  Diabetes.  3.  Right heel chronic plantar fascitis, status post release.  4.  Status post open reduction internal fixation left ankle fracture with      redo in July 2001.  5.  Status post cesarean section x1.   MEDICATIONS:  1.  Lisinopril 20 mg p.o. every day.  2.  Metformin ER 500  mg p.o. b.i.d.  3.  Aspirin 81 mg every day.  4.  Vytorin 10/40 p.o. every day.  5.  Glipizide 5 mg p.o. every day.   ALLERGIES:  No known drug allergies.   FAMILY HISTORY:  The patient's mother is alive at 53 and was diagnosed with  lung cancer approximately one year ago.  The patient's father died at age 40  secondary to an MI.  The patient has a brother who died at an early age  secondary to non medical issues.   SOCIAL HISTORY:  The patient does not smoke.  She does not drink.  She has  five children who are all healthy.  She is married.  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   DATA REVIEW:  D-dimmer is normal.  Coags are normal.  Point-of-care markers  in the emergency room were negative x3.  Hemoglobin is 12.2 with normal MCV,  white count and platelet count are normal.  Chest x-ray reveals no acute  disease.  A 12-lead EKG reveals sinus bradycardia at 57 beats per minute.  I  see minimal but definite ST elevation in lead I, II, aVL, V2-V4.   PHYSICAL EXAMINATION:  VITAL SIGNS:   Temperature 98.5, blood pressure  156/95, heart rate 83, respiratory rate 12, O2 sat is 98% on room air.  GENERAL:  A well developed, well nourished female in no acute respiratory  distress.  HEENT:  Normocephalic, atraumatic.  Pupils equal, round, reactive to light  and accommodation.  Extraocular muscles intact bilaterally.  OC/OP clear.  NECK:  No JVD.  No lymphadenopathy.  No thyromegaly.  LUNGS:  Clear to auscultation bilaterally without wheezes or rhonchi, though  breath sounds are distant.  CARDIOVASCULAR:  Heart sounds are very distant.  The patient is presently  bradycardic at approximately 58 beats per minute.  There is no appreciate  murmur, gallop or rub, though this would be very easy to miss with her heart  sounds as soft as they are.  ABDOMEN:  Mildly overweight, nontender, nondistended, soft.  Bowel sounds  present.  No hepatosplenomegaly.  No rebound.  No ascites.  EXTREMITIES:  No significant cyanosis, clubbing, edema bilateral lower  extremities.  NEUROLOGIC:  Alert and oriented x4.  Cranial nerves II-XII intact  bilaterally.  Strength 5/5 bilateral upper and lower extremities.  Nonfocal  neurologic exam.   IMPRESSION AND PLAN:  1.  Chest pain/chest pressure.  Alyssa Haynes has significant risk factors for      coronary artery disease.  The sensation that she describes in her chest      is very concerning for unstable angina.  I feel that the most prudent      way to progress is to admit her to the hospital.  It is quite possible      that the patient could be suffering with symptomatic bradycardia but      this could also be representative of occlusive coronary artery disease.      She did not appear to have had an acute myocardial infarction at this      time.  I will place the patient in a telemetry bed.  I will place her on      Lovenox at 1 mg/kg every 12 hours for full anticoagulation.  She     requested I not use nitroglycerin because she has a intolerable       headache when she has used this in the past.  I will not  use beta-      blocker due to the patient's bradycardia.  Aspirin has already been      given and we will continue this.  I will consult [facility] to      help with risk stratification and suspect the patient will require at      minimum a Cardiolite but certainly would also be agreeable to a cardiac      catheterization.  2.  Bradycardia.  During the emergency room stay, the patient has had EKGs      recording heart rates into the mid 50s.  During my physical exam,      telemetry monitor has dipped into the mid 40s at times.  It is quite      possible that her symptoms are all related to bradycardia.  She is not      on any medical agents that would typically be expected to bring about      bradycardia.  This raises the concern for sick sinus syndrome.  We will      monitor the patient on telemetry and proceed as appropriate.  3.  Hypertension.  The patient's blood pressure is poorly controlled at      present.  I will not use the patient's Lisinopril, however, as she may      need a cardiac catheterization in the next 48 hours.  We will follow her      blood pressure and if it continues to trend upward, we will consider      adding other appropriate agents such as Norvasc.  I certainly would like      to get her back on the ACE inhibitor though once her catheterization is      carried out if it is, in fact, necessary.  4.  Diabetes mellitus.  The patient's reports her blood sugar is usually in      excess of 150 at home.  I will check a hemoglobin A1c to establish poor      control of her diabetes.  For now, we will use sliding scale insulin and      hold her Metformin as she may very well require cardiac cath.  Glipizide      will not be changed in its dose as I will add sliding scale insulin.  5.  Hyperlipidemia.  We will continue her Vytorin as dosed in the outpatient      setting.      Lonia Blood, M.D.   Electronically Signed     JTM/MEDQ  D:  03/02/2005  T:  03/02/2005  Job:  [id number]   cc:   Georgianne Fick, M.D.  Fax: [phone]

## 2010-07-06 NOTE — Cardiovascular Report (Signed)
NAMEKHAMIL, LAMICA             ACCOUNT NO.:  [id number]   MEDICAL RECORD NO.:  [id number]          PATIENT TYPE:  INP   LOCATION:  3739                         FACILITY:  [facility]   PHYSICIAN:  Arvilla Meres, M.D. LHCDATE OF BIRTH:  [birth year]   DATE OF PROCEDURE:  03/04/2005  DATE OF DISCHARGE:                              CARDIAC CATHETERIZATION   PRIMARY CARE PHYSICIAN:  Georgianne Fick, M.D.   CARDIOLOGIST:  Olga Millers, M.D. [facility].   PATIENT IDENTIFICATION:  Ms. Mcgivern is a delightful 65 year old woman with  a history of hypertension, hyperlipidemia and diabetes as well as a strong  family history of coronary artery disease who was admitted with several-day  history of chest pain with both typical and atypical features. She ruled out  for myocardial infarction with serial cardiac markers and EKG was  unrevealing. She was thus referred for cardiac catheterization.   PROCEDURES PERFORMED:  1.  Selective coronary angiography.  2.  Left heart cath.  3.  Left ventriculogram.   DESCRIPTION OF PROCEDURE:  The risks and benefits of catheterization were  explained. Consent was signed and placed on the chart. A 6-French arterial  sheath was placed in right femoral artery using a modified Seldinger  technique. Standard catheters including preformed Judkins JL-4, JR-4 and  angled pigtail were used for the procedure. All catheter exchanges were made  over a wire. There are no apparent complications.   Central aortic pressure was 128/80 with a mean of 101. LV pressure was  140/119 with an EDP of 21. There is no aortic stenosis.   Left main was long with no angiographic CAD.   LAD was a long vessel wrapping the apex. It gave off multiple septal  perforators. There was a 30% stenosis in the proximal portion. Otherwise no  significant CAD.   Left circumflex was a large vessel. It gave off a moderate-sized OM-1, a  very large branching OM-2. There was a 30% stenosis in the  ostium of the  OM1. There was also tubular 30% stenosis in the lowest of branch of the OM-  2.   Right coronary artery was a moderate size vessel. It gave off acute  marginal, small PDA and three small PLs. There were some mild luminal  irregularities in the mid portion of the RCA but no significant  atherosclerosis.   Left ventriculogram done in the RAO position showed an EF of 50-55% with no  evidence of wall motion abnormality or mitral regurgitation. There did  appear to be significant LVH.   ASSESSMENT:  1.  Minimal nonobstructive coronary artery disease.  2.  Low normal ejection fraction with apparent LVH on V-gram.  3.  Mildly elevated filling pressures.   PLAN:  1.  Should be suitable for discharge home today after her bedrest as long as      her groin remains stable.  2.  She will need aggressive risk factor management with aggressive blood      pressure control.  3.  We have scheduled her for an outpatient echocardiogram next week to      further evaluate her LV function and  LVH.  4.  She will follow up with Dr. Olga Millers in 2-4 weeks.      Arvilla Meres, M.D. [facility]  Electronically Signed     DB/MEDQ  D:  03/04/2005  T:  03/04/2005  Job:  [id number]   cc:   Georgianne Fick, M.D.  Fax: [phone]   Olga Millers, M.D. [facility]  [address]  [address]  [place]  [place] [address]

## 2010-07-06 NOTE — Discharge Summary (Signed)
Alyssa Haynes, RILING             ACCOUNT NO.:  [id number]   MEDICAL RECORD NO.:  [id number]          PATIENT TYPE:  INP   LOCATION:  3739                         FACILITY:  [facility]   PHYSICIAN:  Elliot Cousin, M.D.    DATE OF BIRTH:  1945/10/20   DATE OF ADMISSION:  03/02/2005  DATE OF DISCHARGE:  03/04/2005                                 DISCHARGE SUMMARY   DISCHARGE DIAGNOSES:  1.  Chest pain, myocardial infarction ruled out.  2.  Nonobstructive coronary artery disease. Please see cath report by Dr.      Jens Som on January15,2007.  3.  Mild left ventricular dysfunction with ejection fraction of 50-55% per      cardiac catheterization by Dr. Jens Som.  4.  Hyperlipidemia.  5.  Hypertension.  6.  Type 2 diabetes mellitus.   DISCHARGE MEDICATIONS:  1.  Lisinopril 20 milligrams half a tablet daily.  2.  Lopressor 25 milligrams half a tablet b.i.d.  3.  Glipizide 5 milligrams b.i.d. until you restart metformin. When      metformin is restarted, then go back to once daily or per the discretion      of your primary care physician Dr. Nicholos Johns.  4.  Metformin 500 milligrams 2 tablets daily, do not restart until 48 hours      later.  5.  Vytorin 1 tablet daily.  6.  Aspirin 81 milligrams daily.   DISCHARGE DISPOSITION:  The patient was stable and in no acute distress at  the time of discharge. She has a follow-up appointment with her primary care  physician, Dr. Nicholos Johns, in three days. She has an appointment scheduled  to see Dr. Jens Som on January26,2007 at 12:30 p.m. She has an  echocardiogram scheduled at the [facility] on January22,2007.   CONSULTATIONS:  Olga Millers, M.D.   PROCEDURE PERFORMED:  Cardiac catheterization on January15,2007. Please see  the dictated report by Dr. Olga Millers.   HISTORY OF PRESENT ILLNESS:  The patient is a 65 year old lady with a  positive family history for coronary artery disease, personal history of  diabetes mellitus,  hypertension and hyperlipidemia, who presented to the  emergency department on January13,2007 with a chief complaint of chest  pressure. The chest pressure was associated with shortness of breath,  fatigue and diaphoresis. Given this presentation, the patient was admitted  for further evaluation and management. For additional details, please see  the dictated history and physical by Dr. Sharon Seller.   HOSPITAL COURSE:  Problem 1:  CHEST PAIN/NONOBSTRUCTIVE CORONARY ARTERY  DISEASE/MILD LV DYSFUNCTION:  On admission, the patient's EKG revealed sinus  bradycardia with question Q-waves in the septal leads. Heart rate was 57  beats per minute. Her chest x-ray revealed no active cardiopulmonary  disease. Cardiac markers in the emergency department were negative x3. Her D-  dimer was within normal limits. Her electrolytes were essentially within  normal limits. The patient was started on Lovenox 1 mg/kg every 12 hours and  aspirin therapy as well. Beta blocker therapy was started with Lopressor  b.i.d.; however, the Lopressor was held for a heart rate less than 60. The  patient requested that nitroglycerin not be used secondary to an  intolerable headache.  Additional cardiac enzymes were ordered for further  evaluation as well as a TSH. The patient's cardiac enzymes were negative x3.  Her TSH was within normal limits at 1.391. The BNP was less than 30 as well.   Subsequently, cardiologist, Dr. Jens Som was consulted. Per Dr. Ludwig Clarks  assessment, the patient needed further evaluation via a cardiac  catheterization given her cardiac risk factors. The patient underwent the  cardiac catheterization on January15,2007. Essentially the catheterization  revealed nonobstructive coronary disease with a mildly depressed left  ventricular ejection fraction of 50-55%. There was no evidence of wall  motion abnormalities or mitral regurgitation. There did appear to be  significant LVH. During the hospital  course and following the cardiac  catheterization, the patient had no further complaints of chest pain. Her  blood pressures were well controlled during hospital course on Lopressor and  lisinopril. The patient was discharged home without any complaints. She was  hemodynamically stable. Per Dr. Jens Som, the patient will be further  evaluated with an outpatient 2-D echocardiogram scheduled for  January22,2007.   Problem 2:  HYPERLIPIDEMIA:  The patient's lipid panel was assessed during  hospital course and revealed a total cholesterol of 183, triglycerides of  136, and LDL cholesterol of 118. The patient was maintained on Vytorin 1  tablet daily.   Problem 3:  TYPE 2 DIABETES MELLITUS:  The patient's metformin was withheld  during hospital course because of the anticipated cardiac catheterization.  Glipizide continued at 5 milligrams daily. The patient was also treated with  a sliding scale insulin regimen with NovoLog q.a.c. and q.h.s. The patient's  hemoglobin A1c during hospital course was elevated at 8.8. At the time of  hospital discharge, the patient was advised to increase glipizide to b.i.d.  and to hold the metformin for 48 hours. She was then instructed to restart  the metformin on the third day following the cardiac catheterization and to  return to glipizide 5 milligrams daily. Further management per Dr.  Nicholos Johns.   DISCHARGE LABORATORY DATA:  Sodium 141, potassium 3.6, chloride 109, CO2 23,  glucose 151, BUN 8, creatinine 0.8, calcium 8.7      Elliot Cousin, M.D.  Electronically Signed     DF/MEDQ  D:  03/11/2005  T:  03/11/2005  Job:  [id number]   cc:   Georgianne Fick, M.D.  Fax: [phone]   Olga Millers, M.D. [facility]  [address]  [address]  [place]  [place] [address]

## 2010-07-06 NOTE — Consult Note (Signed)
Alyssa Haynes, Alyssa Haynes             ACCOUNT NO.:  [id number]   MEDICAL RECORD NO.:  [id number]          PATIENT TYPE:  INP   LOCATION:  3739                         FACILITY:  [facility]   PHYSICIAN:  Lorain Childes, M.D. LHCDATE OF BIRTH:  1945-08-08   DATE OF CONSULTATION:  DATE OF DISCHARGE:                                   CONSULTATION   CARDIOLOGY CONSULT NOTE   REFERRING PHYSICIAN:  Dr. Sharon Seller   CHIEF COMPLAINT:  Chest pain.   HISTORY OF PRESENT ILLNESS:  The patient is a 65 year old female with  diabetes, hypertension, and hyperlipidemia, who presents for evaluation of  chest pain.  Patient reports she had chest pain beginning Monday, February 25, 2005 and noted to be mild located mostly in her back with a little bit in  her chest.  On Tuesday evening around 8:30 p.m. she had severe pain that  felt like gas.  She tried to belch with no relief.  She also had some  shortness of breath, nausea, but no vomiting.  Eventually the discomfort  resolved on its own after approximately 30 minutes and she then fell asleep.  When she awoke on Wednesday, she noted soreness in her chest which was  __________  on Thursday.  Friday, she felt tired and had a little soreness.  Today, she had heaviness in her chest like a pushing and gripping  sensation, and also mild shortness of breath.  She reported the symptoms to  her husband and he brought her immediately to the emergency room for further  evaluation.  She denies prior episodes of pain similar to this.  She reports  that the pain does worse with exertion and she has been taking it easy  recently.  She now reports minimal discomfort.  Her breathing is okay.  She  also reports that her pain is altered by changing positions and is it  lessened with sitting up.  She reports she had a prior stress test several  years ago which was normal when she had a physical done.   PAST MEDICAL HISTORY:  1.  Diabetes.  2.  Hypertension.  3.   Hyperlipidemia.   MEDICATIONS:  1.  Lisinopril 20 mg p.o. daily.  2.  Metformin ER 500 mg p.o. b.i.d.  3.  Aspirin 81 mg p.o. daily.  4.  Vytorin 10/40 one tablet p.o. daily.  5.  Glipizide 5 mg p.o. daily.  6.  Centrum Silver one tablet daily.   ALLERGIES:  NO KNOWN DRUG ALLERGIES.   SOCIAL HISTORY:  She lives in [place] with her husband.  She is married.  She has five kids.  She denies any tobacco, no alcohol, no other drugs.   FAMILY HISTORY:  Her mother is alive at the age 24.  She has a history of  lung cancer.  Father died at the age of 47 from a MI.  Her siblings do not  have early coronary disease.   REVIEW OF SYSTEMS:  General:  She denies any fevers, chills, no headache, no  visual changes, no skin rashes or lesions.  Cardiopulmonary as reported in HPI.  She reports dyspnea on exertion, mild  orthopnea, no PND, no lower extremity edema, no palpitations, no syncope.  She reports that she had a cold with a cough approximately a week ago, but  she took over-the-counter medications __________  symptoms.  She denies any  urinary symptoms, no focal weakness, no numbness, no depression or anxiety.  No nausea, vomiting, no bright red blood per rectum, no melena, no change in  her bowel habits.  She denies any polyuria, polydipsia.  Other systems are  negative.   PHYSICAL EXAMINATION:  VITALS:  Temperature 98.5, pulse 83, respirations 12,  blood pressure 156/95, saturating 98% on room air.  GENERAL:  She is a very pleasant female lying comfortably in bed in no acute  distress.  HEENT:  Normocephalic, atraumatic.  NECK:  Her JVP is approximately 6 cm.  She has 2+ carotid upstrokes, no  bruits.  LUNGS:  Clear to auscultation bilaterally.  CARDIOVASCULAR:  Normal S1, split S2, regular rate and rhythm.  She has no  murmurs appreciated.  Pulses are 2+ throughout and equal with no bruits.  ABDOMEN:  Soft, positive bowel sounds.  Nontender, no organomegaly.  EXTREMITIES:  She  has no edema, 2+ distal pulses.  NEUROLOGIC:  Nonfocal.   Chest x-ray shows no acute air space disease.  EKG shows rate 57 sinus  rhythm, normal axis, normal intervals.  No ischemic changes.  She has J  point elevation which is consistent from prior EKGs.   LABORATORY DATA:  Hematocrit of 35.5, platelet count of 235,000, white count  of 4.7.  Point of care cardiac enzymes are negative with troponin less than  0.05 x3.   ASSESSMENT AND PLAN:  Patient is a very pleasant 65 year old female with  multiple cardiac risk factors, who is admitted with chest pain.   1.  Chest pain.  Her symptoms are concerning for angina, also has some      atypical features.  Since she has multiple risk factors and this      discomfort, I agree with admitting her on telemetry, cycling her cardiac      enzymes and following her electrocardiogram.  Medical management      recommending continuing her aspirin and starting metoprolol, continue      her statin and acetamide.  Will continue her lisinopril and titrate the      dose if needed.  I started on nitroglycerin paste.  I agree with      anticoagulating her with Lovenox for now and will plan for a cardiac      catheterization versus a stress test pending her labs and      electrocardiogram.  Will recommend checking a fasting lipid panel.  2.  Hypertension.  Her blood pressure is elevated.  I started her on      metoprolol and may increase her lisinopril if needed.  3.  Diabetes.  I recommend that they hold her metformin for now, continue      her on glipizide and cover with her sliding scale insulin.  We may need      video heart catheterization and we would like to avoid possible lactic      acidosis associated with dye load and metformin.  4.  Hyperlipidemia.  We are checking fasting lipid panel and will continue      on medications for now.   Thank you for the consultation.  Please contact us if you have any questions.  We will continue to follow her  with you.           ______________________________  Lorain Childes, M.D. [facility]     CGF/MEDQ  D:  03/02/2005  T:  03/04/2005  Job:  [id number]

## 2010-07-06 NOTE — Op Note (Signed)
[place]  Patient:    Alyssa Haynes, Alyssa Haynes                      MRN: [id number] Proc. Date: 09/11/99 Attending:  Jearld Adjutant, M.D. CC:         Jearld Adjutant, M.D.                           Operative Report  PREOPERATIVE DIAGNOSES:  1. Chronic right heel plantar fasciitis.  2. Left ankle retained painful metal screws, medial and lateral, status post     open reduction and internal fixation.  POSTOPERATIVE DIAGNOSES:  1. Chronic right heel plantar fasciitis.  2. Left ankle retained painful metal screws, medial and lateral, status post     open reduction and internal fixation.  OPERATION/PROCEDURE:  1. Endoscopically assisted right plantar fascia release.  2. Excision of two screws of medial ankle.  3. Excision of one screw of lateral ankle.  SURGEON: Jearld Adjutant, M.D.  ASSISTANTDoreene Eland, P.A.-C.  ANESTHESIA: General LMA.  CULTURES: None.  DRAINS: None.  ESTIMATED BLOOD LOSS: Minimal.  TOURNIQUET TIME: Right 20 minutes, left less than 15 minutes.  PATHOLOGY FINDINGS/HISTORY: Alyssa Haynes is a 65 year old female who had right ankle pain of three to four months duration.  We saw her on May 04, 1999 She stood on concrete at work with thin rubber pads to carry out job duties and every morning the pain was severe, eased during the day, and taking weight off helped.  Rest made it better, taking Motrin.  We examined her and she had a positive windlass sign, signs of right heel plantar fasciitis, with x-rays showing minimal spur.  We basically injected the area with cortisone and put her in a Sorbothane heel.  Then she came back and she was 30% improved but recurring.  We re-injected her and put her in a Cam walker with a Sorbothane heel and metatarsal pad.  By Jun 20, 1999 she had improvement in right heel plantar fasciitis with injection and Cam walker and she continued light duty work.  Recheck in one month.  By August 03, 1999 her  right heel pain had returned and as soon as she came out of the Cam walker was hurting again.  It hurt to get out of bed in the morning or if she sits for a period of time and goes to stand up.  She is also status post open reduction and internal fixation of the left ankle done in 1992 by Dr. Myrtie Neither.  She says the ankle bothers her, particularly medially.  She can feel a bump there and she thinks it may be the hardware.  The ankle aches and swells at times.  No instability.  We could palpate the screw heads medial and lateral.  X-rays were taken showing bimalleolar fracture to be well-healed.  Two medial malleolar cancellous screws and one long cancellous screw were noted.  At that point our assessment was:  1. Retained painful hardware of left ankle status post open reduction and     internal fixation in 1992.  2. Persistent chronic plantar fasciitis, right heel, not responsive to     conservative management.  At this point we elected to proceed with endoscopic plantar fascia release, right, and metal removal from the medial and lateral ankle.  At surgery no untoward features were noted.  Good release was obtained on the right ankle  and the metal was removed without problem of the left ankle.  DESCRIPTION OF PROCEDURE: With adequate anesthesia obtained using LMA technique the patient was placed in the supine position and both lower extremities were prepped from the toes on the right side to the leg holder on the cast and the left side to the knee in standard fashion.  After standard prepping and draping we started on the left side.  Esmarch exsanguination was used.  The tourniquet was inflated up to 350 mmHg.  We first excised the old lateral incision approximately 2 cm and dissected down to the screw head and removed the screw.  Irrigation was carried out and bleeding points were cauterized.  The tourniquet was let down and the wound closed with 3-0 Vicryl and staples.  We then  turned attention to the medial left ankle, where an incision was made medially over the distal end of the medial incision and scar excised.  Dissection was carried down to the two screw heads and they were removed.  Again bleeding points were cauterized and 3-0 Vicryl used to close the subcutaneous and then staples used.  A bulky sterile compressive dressing with Ace bandage was applied on the left.  We then turned attention to the right, where Esmarch exsanguination was used.  The Esmarch was left on above the ankle and the tourniquet inflated to 350 mmHg.  I then marked out our position for the plantar fascia release on the medial aspect of the foot, 3 cm up and 5 cm anterior, made a medial portal, spreading with a hemostat after use of a knife.  I then brought in a slotted cannula between the os calcis and on top of the plantar fascia, turning the slot down, came in with the scope, and then through the lateral side the triangular blade knife was used to slit the plantar fascia.  I then repositioned the slotted cannula underneath the plantar fascia, looking upward, and slit it from the lateral side with a triangular knife.  I then brought in a 6 mm osteotome from both the medial and lateral portals and scraped off any remaining plantar fascia on the heel. There was a small bony excrescence type spur that one could palpate but, again, the more important thing was the release.  We did not make an attempt to remove the spur.  Again we checked it from both sides with the osteotome to make sure there were no remaining fibers.  I then placed 0.5% Marcaine in and about the wounds (this was plain Marcaine).  This had been done also on the left side around the ankle wounds medial and lateral.  I then closed the medial and lateral portals of the heel endoscopy with 4-0 Nylon and a bulky sterile compressive dressing was applied with Ace.  The patient, having tolerated the procedure well, was awakened  and taken to the recovery room in satisfactory condition, to be discharged per outpatient routine, given Tylox  for pain, and told to call the office for an appointment for recheck a week from Wednesday for follow-up.  She may be weightbearing as tolerated on crutches.  She is to use elevation as much as possible the first day or two and to use wooden sole shoes that we will provide for her. DD:  09/11/99 TD:  09/12/99 Job: [id number]

## 2010-07-06 NOTE — Op Note (Signed)
Alyssa Haynes, Alyssa Haynes             ACCOUNT NO.:  [id number]   MEDICAL RECORD NO.:  [id number]          PATIENT TYPE:  AMB   LOCATION:  ENDO                         FACILITY:  [facility]   PHYSICIAN:  Georgiana Spinner, M.D.    DATE OF BIRTH:  12-20-1945   DATE OF PROCEDURE:  03/27/2005  DATE OF DISCHARGE:                                 OPERATIVE REPORT   PROCEDURE:  Upper endoscopy.   INDICATIONS:  GERD.   ANESTHESIA:  Demerol 50 mg, Versed 5 mg.   PROCEDURE:  With the patient mildly sedated in the left lateral decubitus  position, the Olympus videoscopic endoscope was inserted in the mouth and  passed under direct vision through the esophagus, and there was a question  of Barrett's photographed and biopsied.  We then entered the stomach.  The  fundus, body, antrum, duodenal bulb, second portion of the duodenum were  visualized.  From this point the endoscope was slowly withdrawn taking  circumferential views of the duodenal mucosa until the endoscope had been  pulled back into the stomach, placed in retroflexion to view the stomach  from below.  The endoscope was then straightened and withdrawn taking  circumferential views of the remaining gastric and esophageal mucosa.  The  patient's vital signs and pulse oximeter remained stable.  The patient the  tolerated procedure well without apparent complications.   FINDINGS:  Question of Barrett's esophagus, biopsied.   Await biopsy report.  The patient will call me for results and follow up  with me as an outpatient.  Proceed to colonoscopy as planned.           ______________________________  Georgiana Spinner, M.D.     GMO/MEDQ  D:  03/27/2005  T:  03/27/2005  Job:  [id number]

## 2010-07-06 NOTE — Op Note (Signed)
[name], Alyssa Haynes NO.:  [id number]   MEDICAL RECORD NO.:  [id number]          PATIENT TYPE:  INP   LOCATION:  3739                         FACILITY:  [facility]   PHYSICIAN:  Georgiana Spinner, M.D.    DATE OF BIRTH:  December 21, 1945   DATE OF PROCEDURE:  03/27/2005  DATE OF DISCHARGE:  03/04/2005                                 OPERATIVE REPORT   PROCEDURE:  Colonoscopy.   ANESTHESIA:  Versed 2 mg.   PROCEDURE:  With the patient mildly sedated in the left lateral decubitus  position, the Olympus videoscopic colonoscope was inserted in the rectum and  passed under direct vision to the cecum, identified by ileocecal valve and  appendiceal orifice, both of which were photographed.  From this point, the  colonoscope was slowly withdrawn taking circumferential views of the colonic  mucosa, stopping to photograph diverticulosis seen in the sigmoid colon,  until we reached the rectum, which appeared normal on direct and showed  hemorrhoids on retroflexed view.  The endoscope was straightened and  withdrawn.  The patient's vital signs and pulse oximeter remained stable.  The patient tolerated the procedure well without apparent complications.   FINDINGS:  1.  Diverticulosis of the sigmoid colon.  2.  Internal hemorrhoids.  3.  Otherwise unremarkable exam.   PLAN:  Consider repeat examination in five years.  See endoscopy note for  further details.           ______________________________  Georgiana Spinner, M.D.     GMO/MEDQ  D:  03/27/2005  T:  03/27/2005  Job:  [id number]

## 2010-07-06 NOTE — Op Note (Signed)
NAMEKHRISTA, Alyssa Haynes             ACCOUNT NO.:  [id number]   MEDICAL RECORD NO.:  [id number]          PATIENT TYPE:  OIB   LOCATION:  1011                         [facility]AC[facility]   PHYSICIAN:  Adolph Pollack, M.D.DATE OF BIRTH:  [birth year]   DATE OF PROCEDURE:  05/07/2005  DATE OF DISCHARGE:  05/08/2005                                 OPERATIVE REPORT   PREOPERATIVE DIAGNOSIS:  Symptomatic cholelithiasis.   POSTOPERATIVE DIAGNOSIS:  Symptomatic cholelithiasis.   PROCEDURE:  Laparoscopic cholecystectomy with intraoperative cholangiogram.   SURGEON:  Adolph Pollack, M.D.   ASSISTANT:  Angelia Mould. Derrell Lolling, M.D.   ANESTHESIA:  General.   INDICATIONS:  This 59-year female has had some significant crampy type pain  between her shoulder blades and had a severe case of biliary colic. She had  cardiac disease ruled out and had an ultrasound that demonstrated a 2.3 cm  gallstone and some fatty infiltrations of the liver. She now presents for  elective cholecystectomy. She has diabetes mellitus and is noted to have  slight elevation of some of her liver function tests, specifically her SGOT  and SGPT.   TECHNIQUE:  She is seen in the holding area and then brought to the  operating room, placed supine on the operating table and general anesthetic  was administered. The abdominal wall was sterilely prepped and draped. Local  anesthetic (dilute Marcaine) were infiltrated in the subumbilical region and  a small subumbilical incision was made through the skin, subcutaneous tissue  and midline fascia. The peritoneal cavity was then entered under direct  vision. A pursestring suture of zero Vicryl was placed around the fascial  edges. A Hassan trocar was introduced into the peritoneal cavity and a  pneumoperitoneum was created by insufflation of CO2 gas.   Next the laparoscope was introduced. She was placed in the reverse  Trendelenburg position with the right side tilted slightly  upward. An 11-mm  trocar was placed in the epigastric incision and two 5 mm trocars placed in  the right mid lateral abdomen. The fundus of the gallbladder was grasped and  retracted toward the right shoulder. The infundibulum was grasped. There  were no significant adhesions between the omentum and the gallbladder. I  then began dissection down the gallbladder and mobilized the infundibulum. I  then identified the cystic duct. I was able to trace it down to the common  bile duct region and up to the gallbladder. I created a window around the  cystic duct. A clip was placed in the cystic duct gallbladder junction and  small incision made in the cystic duct. A cholangiocath was passed to the  anterior abdominal wall, placed into the cystic duct and cholangiogram was  performed.   Under real-time fluoroscopy, dilute contrast material was injected into the  cystic duct. The common hepatic, right and left  hepatic and common bile  ducts all opacified promptly and contrast drained into the duodenum without  obvious evidence of obstruction. The final report is pending the  radiologist's interpretation.   I then removed the cholangiocatheter, clipped the cystic duct three times  proximally and  divided it. I then isolated the posterior and anterior branch  of the cystic artery. Windows were created around these, they were clipped  and divided. The gallbladder was dissected free from the liver bed using  electrocautery. Two small holes were made in the gallbladder with a little  bit of spillage of bile. Once the gallbladder was removed, it was placed in  an Endopouch bag.   I then examined liver and noted there to be some significant fatty  infiltration type changes. I irrigated out the perihepatic area and the  gallbladder fossa. I noted no bleeding and no bile leak. The irrigation  fluid was evacuated. The gallbladder was then removed in the Endopouch bag  through the subumbilical port and  the subumbilical fascial defect was closed  under laparoscopic vision by tightening up and tying down the pursestring  suture. The remaining trocars were removed and the CO2 gas was released from  the intraabdominal cavity. The skin incisions were then closed with 4-0  Monocryl subcuticular stitches followed by Steri-Strips and sterile  dressings. She tolerated the procedure well without any apparent  complications and was taken to the recovery in satisfactory condition.      Adolph Pollack, M.D.  Electronically Signed     TJR/MEDQ  D:  05/07/2005  T:  05/08/2005  Job:  [id number]   cc:   Georgianne Fick, M.D.  Fax: [phone]

## 2010-07-10 ENCOUNTER — Ambulatory Visit (INDEPENDENT_AMBULATORY_CARE_PROVIDER_SITE_OTHER): Payer: Self-pay | Admitting: Internal Medicine

## 2010-07-10 ENCOUNTER — Encounter: Payer: Self-pay | Admitting: Internal Medicine

## 2010-07-10 VITALS — BP 130/80 | HR 97 | Temp 98.2°F | Ht 67.4 in | Wt 183.3 lb

## 2010-07-10 DIAGNOSIS — E785 Hyperlipidemia, unspecified: Secondary | ICD-10-CM

## 2010-07-10 DIAGNOSIS — J209 Acute bronchitis, unspecified: Secondary | ICD-10-CM

## 2010-07-10 DIAGNOSIS — J309 Allergic rhinitis, unspecified: Secondary | ICD-10-CM

## 2010-07-10 DIAGNOSIS — J45909 Unspecified asthma, uncomplicated: Secondary | ICD-10-CM

## 2010-07-10 DIAGNOSIS — J40 Bronchitis, not specified as acute or chronic: Secondary | ICD-10-CM

## 2010-07-10 DIAGNOSIS — E119 Type 2 diabetes mellitus without complications: Secondary | ICD-10-CM

## 2010-07-10 DIAGNOSIS — J302 Other seasonal allergic rhinitis: Secondary | ICD-10-CM

## 2010-07-10 LAB — GLUCOSE, CAPILLARY: Glucose-Capillary: 204 mg/dL — ABNORMAL HIGH (ref 70–99)

## 2010-07-10 LAB — POCT GLYCOSYLATED HEMOGLOBIN (HGB A1C): Hemoglobin A1C: 8.3

## 2010-07-10 MED ORDER — ALBUTEROL SULFATE HFA 108 (90 BASE) MCG/ACT IN AERS: 2.0000 | INHALATION_SPRAY | RESPIRATORY_TRACT | Status: DC | PRN

## 2010-07-10 MED ORDER — CETIRIZINE HCL 10 MG PO CHEW: 10.0000 mg | CHEWABLE_TABLET | Freq: Every day | ORAL | Status: DC

## 2010-07-10 MED ORDER — GUAIFENESIN-CODEINE 100-10 MG/5ML PO SYRP: 5.0000 mL | ORAL_SOLUTION | Freq: Three times a day (TID) | ORAL | Status: DC | PRN

## 2010-07-10 MED ORDER — PRAVASTATIN SODIUM 20 MG PO TABS: 20.0000 mg | ORAL_TABLET | Freq: Every day | ORAL | Status: DC

## 2010-07-10 MED ORDER — INSULIN GLARGINE 100 UNIT/ML ~~LOC~~ SOLN: SUBCUTANEOUS | Status: DC

## 2010-07-10 MED ORDER — METFORMIN HCL 1000 MG PO TABS: 1000.0000 mg | ORAL_TABLET | Freq: Two times a day (BID) | ORAL | Status: DC

## 2010-07-10 MED ORDER — GLIMEPIRIDE 4 MG PO TABS: 4.0000 mg | ORAL_TABLET | Freq: Two times a day (BID) | ORAL | Status: DC

## 2010-07-10 MED ORDER — AMOXICILLIN 500 MG PO CAPS: 500.0000 mg | ORAL_CAPSULE | Freq: Two times a day (BID) | ORAL | Status: DC

## 2010-07-10 NOTE — Progress Notes (Signed)
  Subjective:    Patient ID: Alyssa Haynes, female    DOB: 17-Jan-1946, 65 y.o.   MRN: [id number]  HPI 1. C/o productive cough with a clear sputum (1 Tsp amounts) for 1 week; no blood; denies fever, chills, weight changes; pleuritic chest pain. Reports nasal drainage and increase in wheezing. Patient states that :"by mistake' was using her husband inhaler (name of medication is unknown) instead of albuterol HFA. Patient denies orthopnea, PND, leg swelling. 2. DM --patient does not check her FBG but denies any hypoglycemic events. Patient admits to not using her insulin on a regular basis and "forgets her doses sometimes." Denies any visual changes or polyuria.   Review of Systems Per HPI    Objective:   Physical Exam  General: Vital signs reviewed and noted. Well-developed, well-nourished, in no acute distress; alert, appropriate and cooperative throughout examination.  Head: Normocephalic, atraumatic.  Neck: No deformities, masses, or tenderness noted.  Lungs:  Normal respiratory effort. Crackles with cough; wheezes to upper lobes bilaterally  Heart: RRR. S1 and S2 normal without gallop, murmur, or rubs.  Abdomen:  BS normoactive. Soft, Nondistended, non-tender.  No masses or organomegaly.  Extremities: No pedal edema bilaterally.                                Assessment & Plan:

## 2010-07-10 NOTE — Patient Instructions (Signed)
Please, do not drive while taking cough medicine. Please, drink plenty of fluids and rest. Please, call if not feeling any better or worse. Please, inject 16 Units of Lantus before bedtime and check your blood sugars for 1 week and bring with you your meter next OV.

## 2010-07-10 NOTE — Assessment & Plan Note (Signed)
Uncontrolled due to the patient's noncompliance with Lantus/diet and exercise. Patient does not check her FBG as was instructed. Risks of DM reviewed with the patient. Strongly advised to start checking FBG levels and bring meter next OV. Will restart Lantus at 16 units SQ qhs --symptoms of hypoglycemia reviewed with the patient. Patient refused to see Hulen Shouts for DME for now ->"will think about it." Will return in 4-6 weeks for a follow up. Strongly encouraged to call with any questions or concerns.

## 2010-07-10 NOTE — Assessment & Plan Note (Signed)
Likely exacerbated with seasonal allergies/rhinitis. Will restart Zyrtec; Robitussin AC for cough control and mucus liquefaction/expectoration; amoxyl for 7 days (patient refused azithromycin->"tastes bad."); hydration; rest; albuterol HFA 2 puffs q 4 hours PRN. Instructed to call or go to Ed if no improvement or worsening of symptoms. Patient and her daughter verbalized understanding.

## 2010-10-30 ENCOUNTER — Encounter: Payer: Self-pay | Admitting: Internal Medicine

## 2010-10-30 ENCOUNTER — Ambulatory Visit (INDEPENDENT_AMBULATORY_CARE_PROVIDER_SITE_OTHER): Payer: Self-pay | Admitting: Internal Medicine

## 2010-10-30 ENCOUNTER — Telehealth: Payer: Self-pay | Admitting: *Deleted

## 2010-10-30 VITALS — BP 136/86 | HR 92 | Temp 98.4°F | Ht 67.0 in | Wt 181.7 lb

## 2010-10-30 DIAGNOSIS — T148XXA Other injury of unspecified body region, initial encounter: Secondary | ICD-10-CM

## 2010-10-30 DIAGNOSIS — W57XXXA Bitten or stung by nonvenomous insect and other nonvenomous arthropods, initial encounter: Secondary | ICD-10-CM

## 2010-10-30 DIAGNOSIS — L0291 Cutaneous abscess, unspecified: Secondary | ICD-10-CM

## 2010-10-30 DIAGNOSIS — L0390 Cellulitis, unspecified: Secondary | ICD-10-CM

## 2010-10-30 DIAGNOSIS — E119 Type 2 diabetes mellitus without complications: Secondary | ICD-10-CM

## 2010-10-30 DIAGNOSIS — T148 Other injury of unspecified body region: Secondary | ICD-10-CM

## 2010-10-30 LAB — POCT GLYCOSYLATED HEMOGLOBIN (HGB A1C): Hemoglobin A1C: 8.3

## 2010-10-30 LAB — GLUCOSE, CAPILLARY: Glucose-Capillary: 92 mg/dL (ref 70–99)

## 2010-10-30 MED ORDER — DOXYCYCLINE HYCLATE 100 MG PO TABS: 100.0000 mg | ORAL_TABLET | Freq: Two times a day (BID) | ORAL | Status: AC

## 2010-10-30 MED ORDER — TRIAMCINOLONE ACETONIDE 0.1 % EX CREA: TOPICAL_CREAM | Freq: Two times a day (BID) | CUTANEOUS | Status: DC

## 2010-10-30 MED ORDER — CHLORHEXIDINE GLUCONATE 4 % EX SOLN: 1.0000 "application " | Freq: Every day | CUTANEOUS | Status: DC

## 2010-10-30 MED ORDER — DIPHENHYDRAMINE HCL 25 MG PO CAPS: 25.0000 mg | ORAL_CAPSULE | Freq: Four times a day (QID) | ORAL | Status: AC | PRN

## 2010-10-30 NOTE — Patient Instructions (Addendum)
Start Doxycycline 100 mg tablets twice daily for 10 days.  You can use the Triamcinolone 0.1% cream to help relieve the itching.  You can also use benadryl or Zyrtec (generic brand is just as good) to help with the itching.  Benadryl can make you sleepy.  If you have a fever above 101 F or the areas to not get better please come back.  Follow up in about 2 weeks to see how your doing and to get caught up on your other monitoring things for your diabetes.

## 2010-10-30 NOTE — Telephone Encounter (Signed)
Pt calls c/o for appr 2 weeks she has been having areas that look possibly like bites on her body, the areas start to itch first then they become red, swollen and itch more. She has had places on her fingers, forearm, head, eye, foot, she has 2 spots now. She has tried alcohol and neosporin w/out relief. She ask for appt, will be seen at 1415 today, dr Tonny Branch

## 2010-10-30 NOTE — Progress Notes (Signed)
Subjective:   Patient ID: Alyssa Haynes female   DOB: 1945/06/23 65 y.o.   MRN: [id number]  HPI: Alyssa Haynes is a 65 y.o. woman who presents to clinic today complaining of bumps on her hands for aout 2 weeks ago She states they started as bumps with redness and swelling which then moved to blisters and then popped and got better slowly.  She states that she has never had anything before.  She denies any extended time outside other then on the porch when she drinks her coffee.  She states that the itching usually first then noticed the bumps and redness.  She has been treating them with rubbing alcohol that does not seem to help.  She denies fever, joint aches, or flu like symptoms.  She has recently changed to regular downy sheets, uses lots of different lotions but denies new perfumes, soaps, dish or hand soaps.    Past Medical History  Diagnosis Date  . Asthma   . Diabetes mellitus   . GERD (gastroesophageal reflux disease)   . Hypertension   . Hyperlipidemia    Current Outpatient Prescriptions  Medication Sig Dispense Refill  . albuterol (PROVENTIL HFA) 108 (90 BASE) MCG/ACT inhaler Inhale 2 puffs into the lungs every 4 (four) hours as needed for shortness of breath.  3 each  4  . amoxicillin (AMOXIL) 500 MG capsule Take 1 capsule (500 mg total) by mouth 2 (two) times daily. For 7 days  14 capsule  0  . aspirin (ASPIR-LOW) 81 MG EC tablet Take 81 mg by mouth daily. With meal; for heart protection       . Blood Glucose Monitoring Suppl (ONE TOUCH ULTRA SYSTEM KIT) W/DEVICE KIT Check blood sugar twice a day before meals       . cetirizine (ZYRTEC) 10 MG chewable tablet Chew 1 tablet (10 mg total) by mouth daily.  90 tablet  3  . glimepiride (AMARYL) 4 MG tablet Take 1 tablet (4 mg total) by mouth 2 (two) times daily.  90 tablet  4  . hydrochlorothiazide 25 MG tablet Take 25 mg by mouth every morning.        . insulin glargine (LANTUS) 100 UNIT/ML injection Inject 16 Units Sq qhs   10 mL  3  . metFORMIN (GLUCOPHAGE) 1000 MG tablet Take 1 tablet (1,000 mg total) by mouth 2 (two) times daily with a meal.  180 tablet  4  . pravastatin (PRAVACHOL) 20 MG tablet Take 1 tablet (20 mg total) by mouth at bedtime.  90 tablet  4   Family History  Problem Relation Age of Onset  . Cancer Mother 39    lUNG CANCER  . Heart disease Father 49   History   Social History  . Marital Status: Married    Spouse Name: N/A    Number of Children: N/A  . Years of Education: N/A   Social History Main Topics  . Smoking status: Former Games developer  . Smokeless tobacco: None   Comment: stopped in1975  . Alcohol Use: No  . Drug Use: No  . Sexually Active: Yes    Birth Control/ Protection: Surgical   Other Topics Concern  . None   Social History Narrative  . None   Review of Systems: Negative except as noted in the HPI.   Objective:  Physical Exam: Filed Vitals:   10/30/10 1419  BP: 136/86  Pulse: 92  Temp: 98.4 F (36.9 C)  TempSrc: Oral  Height: 5\' 7"  (  1.702 m)  Weight: 181 lb 11.2 oz (82.419 kg)   Constitutional: Vital signs reviewed.  Patient is a well-developed and well-nourished woman in no acute distress and cooperative with exam. Alert and oriented x3.  Head: Normocephalic and atraumatic Ear: TM normal bilaterally Mouth: no erythema or exudates, MMM Eyes: PERRL, EOMI, conjunctivae normal, No scleral icterus.  Neck: Supple, Trachea midline normal ROM, No JVD, mass, thyromegaly, or carotid bruit present.  Cardiovascular: RRR, S1 normal, S2 normal, no MRG, pulses symmetric and intact bilaterally Pulmonary/Chest: CTAB, no wheezes, rales, or rhonchi Abdominal: Soft. Non-tender, non-distended, bowel sounds are normal, no masses, organomegaly, or guarding present.  Musculoskeletal: No joint deformities, erythema, or stiffness, ROM full and no nontender Hematology: no cervical, inginal, or axillary adenopathy.  Skin: Warm, dry and intact. There is a 5 cm x 4 cm  erythematous raised area on the right forearm with a central vesicle.  It is warm to the touch.  There are several healed ulcerations over the right elbow and left calf.  No cyanosis, or clubbing.  Psychiatric: Normal mood and affect. speech and behavior is normal. Judgment and thought content normal. Cognition and memory are normal.   Assessment & Plan:

## 2010-10-30 NOTE — Telephone Encounter (Signed)
Agree with plan 

## 2010-11-05 NOTE — Assessment & Plan Note (Signed)
There is cellulitis surrounding the bite area on the right forearm that is warm and red.  We will treat with doxycycline 100 mg BID for 10 days and have her follow up in 2 weeks to see how she is doing.

## 2010-11-05 NOTE — Assessment & Plan Note (Addendum)
Lab Results  Component Value Date   HGBA1C 8.3 10/30/2010   HGBA1C 8.3 07/10/2010   HGBA1C 8.0 04/17/2010   Lab Results  Component Value Date   MICROALBUR 0.75 09/02/2008   LDLCALC 130* 09/16/2008   CREATININE 0.80 09/16/2008   Her A1C done on 9/11 is essentially the same as previous.  She did not bring her meter today so I'm not going to change her lantus dose at this time.  She was encouraged to bring her meter to her follow up visit.  She will also need FLP, urine microalbumin, etc to get caught up on her diabetes care.

## 2010-11-05 NOTE — Assessment & Plan Note (Addendum)
The area appears to be an insect bite likely from a spider.  No areas of necrosis but there is surrounding cellulitis.  See Cellulitis for treatment.  We will use benadryl or zyrtec and triamcinolone cream to treat the itching.

## 2010-11-13 ENCOUNTER — Encounter: Payer: Self-pay | Admitting: Internal Medicine

## 2010-11-14 LAB — POCT URINALYSIS DIP (DEVICE)
Bilirubin Urine: NEGATIVE
Glucose, UA: NEGATIVE
Hgb urine dipstick: NEGATIVE
Ketones, ur: NEGATIVE
Nitrite: NEGATIVE
Operator id: 282151
Protein, ur: NEGATIVE
Specific Gravity, Urine: 1.02
Urobilinogen, UA: 0.2
pH: 5

## 2010-11-14 LAB — POCT I-STAT, CHEM 8
BUN: 12
Calcium, Ion: 1.26
Chloride: 105
Creatinine, Ser: 0.9
Glucose, Bld: 111 — ABNORMAL HIGH
HCT: 40
Hemoglobin: 13.6
Potassium: 4
Sodium: 142
TCO2: 28

## 2010-12-06 ENCOUNTER — Encounter: Payer: Self-pay | Admitting: Internal Medicine

## 2010-12-06 ENCOUNTER — Ambulatory Visit (INDEPENDENT_AMBULATORY_CARE_PROVIDER_SITE_OTHER): Payer: Self-pay | Admitting: Internal Medicine

## 2010-12-06 VITALS — BP 122/81 | HR 106 | Temp 97.2°F | Ht 67.0 in | Wt 182.7 lb

## 2010-12-06 DIAGNOSIS — E119 Type 2 diabetes mellitus without complications: Secondary | ICD-10-CM

## 2010-12-06 DIAGNOSIS — F329 Major depressive disorder, single episode, unspecified: Secondary | ICD-10-CM

## 2010-12-06 DIAGNOSIS — F32A Depression, unspecified: Secondary | ICD-10-CM

## 2010-12-06 LAB — GLUCOSE, CAPILLARY: Glucose-Capillary: 320 mg/dL — ABNORMAL HIGH (ref 70–99)

## 2010-12-06 NOTE — Progress Notes (Deleted)
  Subjective:    Patient ID: Alyssa Haynes, female    DOB: 05-26-1945, 65 y.o.   MRN: [id number]  HPI    Review of Systems     Objective:   Physical Exam        Assessment & Plan:

## 2010-12-06 NOTE — Progress Notes (Signed)
HPI: 1. Patient feels "depressed." Reports that she is under lots stress with her 5 grown-up children. Reports increase in appetite; decrease in sleep and being tearful. Used Welbutrin, Effexor and Lexapro in the past but "did not like they way it made her feel loopy."  Past Medical History  Diagnosis Date  . Asthma   . Diabetes mellitus   . GERD (gastroesophageal reflux disease)   . Hypertension   . Hyperlipidemia    Current Outpatient Prescriptions  Medication Sig Dispense Refill  . albuterol (PROVENTIL HFA) 108 (90 BASE) MCG/ACT inhaler Inhale 2 puffs into the lungs every 4 (four) hours as needed for shortness of breath.  3 each  4  . aspirin (ASPIR-LOW) 81 MG EC tablet Take 81 mg by mouth daily. With meal; for heart protection       . Blood Glucose Monitoring Suppl (ONE TOUCH ULTRA SYSTEM KIT) W/DEVICE KIT Check blood sugar twice a day before meals       . Chlorhexidine Gluconate 4 % SOLN Apply 1 application topically daily.  237 mL  0  . glimepiride (AMARYL) 4 MG tablet Take 1 tablet (4 mg total) by mouth 2 (two) times daily.  90 tablet  4  . hydrochlorothiazide 25 MG tablet Take 25 mg by mouth every morning.        . insulin glargine (LANTUS) 100 UNIT/ML injection Inject 16 Units Sq qhs  10 mL  3  . metFORMIN (GLUCOPHAGE) 1000 MG tablet Take 1 tablet (1,000 mg total) by mouth 2 (two) times daily with a meal.  180 tablet  4  . pravastatin (PRAVACHOL) 20 MG tablet Take 1 tablet (20 mg total) by mouth at bedtime.  90 tablet  4  . triamcinolone (KENALOG) 0.1 % cream Apply topically 2 (two) times daily.  30 g  0   Family History  Problem Relation Age of Onset  . Cancer Mother 54    lUNG CANCER  . Heart disease Father 79   History   Social History  . Marital Status: Married    Spouse Name: N/A    Number of Children: N/A  . Years of Education: N/A   Social History Main Topics  . Smoking status: Former Games developer  . Smokeless tobacco: None   Comment: stopped in1975  . Alcohol Use:  No  . Drug Use: No  . Sexually Active: Yes    Birth Control/ Protection: Surgical   Other Topics Concern  . None   Social History Narrative  . None    Review of Systems: Constitutional: Denies fever, chills, diaphoresis, appetite change and fatigue.  HEENT: Denies photophobia, eye pain, redness, hearing loss, ear pain, congestion, sore throat, rhinorrhea, sneezing, mouth sores, trouble swallowing, neck pain, neck stiffness and tinnitus.  Respiratory: Denies SOB, DOE, cough, chest tightness, and wheezing.  Cardiovascular: Denies chest pain, palpitations and leg swelling.  Gastrointestinal: Denies nausea, vomiting, abdominal pain, diarrhea, constipation, blood in stool and abdominal distention.  Genitourinary: Denies dysuria, urgency, frequency, hematuria, flank pain and difficulty urinating.  Musculoskeletal: Denies myalgias, back pain, joint swelling, arthralgias and gait problem.  Skin: Denies pallor, rash and wound.  Neurological: Denies dizziness, seizures, syncope, weakness, light-headedness, numbness and headaches.  Hematological: Denies adenopathy. Easy bruising, personal or family bleeding history  Psychiatric/Behavioral: Denies suicidal ideation, onfusion, nervousness,  and agitation. Confirms sleep disturbance and racing thoughts.  Vitals: reviewed General: alert, well-developed, and cooperative to examination.  Head: normocephalic and atraumatic.  Eyes: vision grossly intact, pupils equal, pupils round, pupils  reactive to light, no injection and anicteric.  Mouth: pharynx pink and moist, no erythema, and no exudates.  Neck: supple, full ROM, no thyromegaly, no JVD, and no carotid bruits.  Lungs: normal respiratory effort, no accessory muscle use, normal breath sounds, no crackles, and no wheezes. Heart: normal rate, regular rhythm, no murmur, no gallop, and no rub.  Abdomen: soft, non-tender, normal bowel sounds, no distention, no guarding, no rebound tenderness, no  hepatomegaly, and no splenomegaly.  Msk: no joint swelling, no joint warmth, and no redness over joints.  Pulses: 2+ DP/PT pulses bilaterally Extremities: No cyanosis, clubbing, edema Neurologic: alert & oriented X3, cranial nerves II-XII intact, strength normal in all extremities, sensation intact to light touch, and gait normal.  Skin: turgor normal and no rashes.  Psych: Oriented X3, memory intact for recent and remote, normally interactive, good eye contact, not anxious appearing. She is depressed appearing and tearfull    Assessment & Plan:  1. Depression. Denies SI/HI or mania -refuses anti-depressants -agreed to see Georges Mouse for counselling -instructed to call 911 and/or go to ED if feels worse  2. DM, insulin-dpendent -compliant with her insulin regimen -will recheck HgbA1C next visit.

## 2010-12-12 ENCOUNTER — Telehealth: Payer: Self-pay | Admitting: Licensed Clinical Social Worker

## 2010-12-14 ENCOUNTER — Telehealth: Payer: Self-pay | Admitting: Licensed Clinical Social Worker

## 2010-12-14 NOTE — Telephone Encounter (Signed)
Patient will meet w/ social work on 10/29 at 10 AM

## 2010-12-17 ENCOUNTER — Ambulatory Visit: Payer: Self-pay | Admitting: Licensed Clinical Social Worker

## 2010-12-17 DIAGNOSIS — F329 Major depressive disorder, single episode, unspecified: Secondary | ICD-10-CM

## 2010-12-17 DIAGNOSIS — F32A Depression, unspecified: Secondary | ICD-10-CM
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
## 2010-12-20 NOTE — Telephone Encounter (Signed)
Saw pt on 10/29 for assessment, counseling, and planning.

## 2010-12-24 ENCOUNTER — Other Ambulatory Visit: Payer: Self-pay | Admitting: Internal Medicine

## 2010-12-25 ENCOUNTER — Ambulatory Visit (INDEPENDENT_AMBULATORY_CARE_PROVIDER_SITE_OTHER): Payer: Self-pay

## 2010-12-25 DIAGNOSIS — Z23 Encounter for immunization: Secondary | ICD-10-CM

## 2010-12-28 ENCOUNTER — Other Ambulatory Visit: Payer: Self-pay | Admitting: *Deleted

## 2010-12-28 MED ORDER — INSULIN PEN NEEDLE 31G X 5 MM MISC: 16.0000 [IU] | Freq: Every day | Status: DC

## 2010-12-28 NOTE — Telephone Encounter (Signed)
Prior to ordering insulin pen needles, would recommend confirming that she is using the lantus solostar insulin pen because she has Lantus Vials on her medication list.   If she is using pens then recommend ordering 31 gauge needles of 4mm, 5mm or 6mm length. If she is using vials, then recommend 31 gauge, 0.3 cc volume and 8 mm length syringes

## 2011-01-08 ENCOUNTER — Other Ambulatory Visit: Payer: Self-pay | Admitting: Internal Medicine

## 2011-01-24 ENCOUNTER — Other Ambulatory Visit: Payer: Self-pay | Admitting: Internal Medicine

## 2011-01-24 DIAGNOSIS — I10 Essential (primary) hypertension: Secondary | ICD-10-CM

## 2011-01-25 ENCOUNTER — Telehealth: Payer: Self-pay | Admitting: *Deleted

## 2011-01-25 ENCOUNTER — Ambulatory Visit (INDEPENDENT_AMBULATORY_CARE_PROVIDER_SITE_OTHER): Payer: Self-pay | Admitting: Internal Medicine

## 2011-01-25 ENCOUNTER — Encounter: Payer: Self-pay | Admitting: Internal Medicine

## 2011-01-25 DIAGNOSIS — R059 Cough, unspecified: Secondary | ICD-10-CM

## 2011-01-25 DIAGNOSIS — E785 Hyperlipidemia, unspecified: Secondary | ICD-10-CM

## 2011-01-25 DIAGNOSIS — K219 Gastro-esophageal reflux disease without esophagitis: Secondary | ICD-10-CM

## 2011-01-25 DIAGNOSIS — R058 Other specified cough: Secondary | ICD-10-CM

## 2011-01-25 DIAGNOSIS — R05 Cough: Secondary | ICD-10-CM

## 2011-01-25 DIAGNOSIS — E119 Type 2 diabetes mellitus without complications: Secondary | ICD-10-CM

## 2011-01-25 LAB — LIPID PANEL
Cholesterol: 171 mg/dL (ref 0–200)
HDL: 44 mg/dL (ref >39)
LDL Cholesterol: 102 mg/dL — ABNORMAL HIGH (ref 0–99)
Total CHOL/HDL Ratio: 3.9 Ratio
Triglycerides: 123 mg/dL (ref <150)
VLDL: 25 mg/dL (ref 0–40)

## 2011-01-25 MED ORDER — OMEPRAZOLE 40 MG PO CPDR: 40.0000 mg | DELAYED_RELEASE_CAPSULE | Freq: Every day | ORAL | Status: DC

## 2011-01-25 NOTE — Assessment & Plan Note (Signed)
She is not on any acid suppressant medications for her GERD which is getting worse for past few weeks and correlates with her dry cough. I think it's playing an important role along with postnasal drip and asthma for her dry cough and explained her in detail about 30 minutes about pathophysiology and like a diagnosis of her cough. After discussing with her we decided to start Prilosec 40 mg daily and keep on using allergy medications along with albuterol as needed. She verbalized understanding.

## 2011-01-25 NOTE — Patient Instructions (Signed)
Please make a followup appointment in 3-4 months or as needed. Start taking Prilosec 1 tablet daily before meal for your reflux which is likely cause of your cough right now. Also use over-the-counter allergy medication daily along with as needed albuterol inhaler. Try to eat dinner earlier and small meal- and a lower 2-3 hours to pass before you go to bed- so that food won't comeback in your food pipe.

## 2011-01-25 NOTE — Assessment & Plan Note (Signed)
Last lipid panel in 2010. I will recheck today.

## 2011-01-25 NOTE — Assessment & Plan Note (Signed)
As described in history of present illness and physical exam negative for any pulmonary etiology, the cough seems to be likely from combination of postnasal drip and GERD along with her history of asthma which will predispose her to have hyper reactivity of her airways with inflammation due to acid reflux. After discussing and explaining her in detail for 30 minutes- decided to start Prilosec daily and keep on using allergy medications along with albuterol as needed. She will call if the cough goes productive and she starts having fever- to make an early appointment or otherwise will followup in 3-4 months for diabetes.

## 2011-01-25 NOTE — Assessment & Plan Note (Signed)
Recheck HbA1c today. Also will check lipid panel and urine microalbumin/creatinine ratio.

## 2011-01-25 NOTE — Telephone Encounter (Signed)
Call from pt sted that she called on Monday about a cough.  Cough has worsened.  No fever or chills.  Has taken OTC meds x 3 weeks with no relief.  When she sleeps states she is awaken by wheezing.  Given an appointment for this am. Angelina Ok, RN 9:40 AM

## 2011-01-25 NOTE — Progress Notes (Signed)
  Subjective:    Patient ID: Alyssa Haynes, female    DOB: 1945-04-08, 65 y.o.   MRN: [id number]  HPI Ms. Soucy is a pleasant 65 year woman with past with history of DM 2, hypertension, GERD who comes the clinic with complaint of nonproductive cough for last 3 weeks. She started having cough and congestion about 3 weeks before but was dry and didn't have any sputum production.  She complains of postnasal drip. She's also taking over-the-counter allergy medications for that. She does complain of food coming back in throat every night when she goes to bed- and lately she has to use pillows to help her symptomatically. Her cough gets worse with reflux symptoms at night and she feels wheezing and congested in chest. Due to this she had to use albuterol inhaler 2 puffs every night for last week- which helps for a while.  Though she denies any fever, chills, headache or vision changes, sore throat. She also denies any chest pain, short of breath, abdominal pain, nausea vomiting, diarrhea, urinary abnormalities.    Review of Systems    as per history of present illness, all other systems reviewed and negative. Objective:   Physical Exam  Vitals: Reviewed. General: NAD HEENT: PERRL, EOMI, no scleral icterus Cardiac: S1, S2, RRR, no rubs, murmurs or gallops Pulm: clear to auscultation bilaterally, moving normal volumes of air Abd: soft, nontender, nondistended, BS present Ext: warm and well perfused, no pedal edema Neuro: alert and oriented X3, cranial nerves II-XII grossly intact, strength and sensation to light touch equal in bilateral upper and lower extremities       Assessment & Plan:

## 2011-01-26 LAB — MICROALBUMIN / CREATININE URINE RATIO
Creatinine, Urine: 118.4 mg/dL
Microalb Creat Ratio: 6.8 mg/g (ref 0.0–30.0)
Microalb, Ur: 0.8 mg/dL (ref 0.00–1.89)

## 2011-01-28 ENCOUNTER — Other Ambulatory Visit: Payer: Self-pay

## 2011-01-28 ENCOUNTER — Encounter (HOSPITAL_COMMUNITY): Payer: Self-pay

## 2011-01-28 ENCOUNTER — Emergency Department (HOSPITAL_COMMUNITY): Payer: Self-pay

## 2011-01-28 ENCOUNTER — Telehealth: Payer: Self-pay | Admitting: Internal Medicine

## 2011-01-28 ENCOUNTER — Emergency Department (HOSPITAL_COMMUNITY)
Admission: EM | Admit: 2011-01-28 | Discharge: 2011-01-28 | Disposition: A | Discharge status: Home / Self Care | Payer: Self-pay | Attending: Emergency Medicine | Admitting: Emergency Medicine

## 2011-01-28 ENCOUNTER — Other Ambulatory Visit: Payer: Self-pay | Admitting: Internal Medicine

## 2011-01-28 DIAGNOSIS — J45909 Unspecified asthma, uncomplicated: Secondary | ICD-10-CM | POA: Insufficient documentation

## 2011-01-28 DIAGNOSIS — R51 Headache: Secondary | ICD-10-CM | POA: Insufficient documentation

## 2011-01-28 DIAGNOSIS — E119 Type 2 diabetes mellitus without complications: Secondary | ICD-10-CM | POA: Insufficient documentation

## 2011-01-28 DIAGNOSIS — K219 Gastro-esophageal reflux disease without esophagitis: Secondary | ICD-10-CM | POA: Insufficient documentation

## 2011-01-28 DIAGNOSIS — Z794 Long term (current) use of insulin: Secondary | ICD-10-CM | POA: Insufficient documentation

## 2011-01-28 DIAGNOSIS — Z79899 Other long term (current) drug therapy: Secondary | ICD-10-CM | POA: Insufficient documentation

## 2011-01-28 DIAGNOSIS — R079 Chest pain, unspecified: Secondary | ICD-10-CM | POA: Insufficient documentation

## 2011-01-28 DIAGNOSIS — R21 Rash and other nonspecific skin eruption: Secondary | ICD-10-CM | POA: Insufficient documentation

## 2011-01-28 DIAGNOSIS — I10 Essential (primary) hypertension: Secondary | ICD-10-CM | POA: Insufficient documentation

## 2011-01-28 DIAGNOSIS — R059 Cough, unspecified: Secondary | ICD-10-CM | POA: Insufficient documentation

## 2011-01-28 DIAGNOSIS — M546 Pain in thoracic spine: Secondary | ICD-10-CM | POA: Insufficient documentation

## 2011-01-28 DIAGNOSIS — Z1231 Encounter for screening mammogram for malignant neoplasm of breast: Secondary | ICD-10-CM

## 2011-01-28 DIAGNOSIS — Z7982 Long term (current) use of aspirin: Secondary | ICD-10-CM | POA: Insufficient documentation

## 2011-01-28 DIAGNOSIS — E785 Hyperlipidemia, unspecified: Secondary | ICD-10-CM | POA: Insufficient documentation

## 2011-01-28 DIAGNOSIS — R05 Cough: Secondary | ICD-10-CM | POA: Insufficient documentation

## 2011-01-28 LAB — CBC
HCT: 41.5 % (ref 36.0–46.0)
Hemoglobin: 14.1 g/dL (ref 12.0–15.0)
MCH: 29.4 pg (ref 26.0–34.0)
MCHC: 34 g/dL (ref 30.0–36.0)
MCV: 86.6 fL (ref 78.0–100.0)
Platelets: 236 10*3/uL (ref 150–400)
RBC: 4.79 MIL/uL (ref 3.87–5.11)
RDW: 13.6 % (ref 11.5–15.5)
WBC: 6.8 10*3/uL (ref 4.0–10.5)

## 2011-01-28 LAB — D-DIMER, QUANTITATIVE (NOT AT ARMC): D-Dimer, Quant: 0.24 ug/mL-FEU (ref 0.00–0.48)

## 2011-01-28 LAB — DIFFERENTIAL
Basophils Absolute: 0 10*3/uL (ref 0.0–0.1)
Basophils Relative: 1 % (ref 0–1)
Eosinophils Absolute: 0.9 10*3/uL — ABNORMAL HIGH (ref 0.0–0.7)
Eosinophils Relative: 13 % — ABNORMAL HIGH (ref 0–5)
Lymphocytes Relative: 24 % (ref 12–46)
Lymphs Abs: 1.6 10*3/uL (ref 0.7–4.0)
Monocytes Absolute: 0.8 10*3/uL (ref 0.1–1.0)
Monocytes Relative: 11 % (ref 3–12)
Neutro Abs: 3.5 10*3/uL (ref 1.7–7.7)
Neutrophils Relative %: 51 % (ref 43–77)

## 2011-01-28 LAB — POCT I-STAT TROPONIN I: Troponin i, poc: 0 ng/mL (ref 0.00–0.08)

## 2011-01-28 MED ORDER — HYDROCOD POLST-CHLORPHEN POLST 10-8 MG/5ML PO LQCR
5.0000 mL | Freq: Once | ORAL | Status: AC
  Administered 2011-01-28: 5 mL via ORAL
  Filled 2011-01-28: qty 5

## 2011-01-28 MED ORDER — ALBUTEROL SULFATE (5 MG/ML) 0.5% IN NEBU
5.0000 mg | INHALATION_SOLUTION | Freq: Once | RESPIRATORY_TRACT | Status: AC
  Administered 2011-01-28: 5 mg via RESPIRATORY_TRACT
  Filled 2011-01-28: qty 1

## 2011-01-28 MED ORDER — GI COCKTAIL ~~LOC~~
30.0000 mL | Freq: Once | ORAL | Status: AC
  Administered 2011-01-28: 30 mL via ORAL
  Filled 2011-01-28: qty 30

## 2011-01-28 MED ORDER — PREDNISONE 20 MG PO TABS
60.0000 mg | ORAL_TABLET | Freq: Once | ORAL | Status: AC
  Administered 2011-01-28: 60 mg via ORAL
  Filled 2011-01-28: qty 3

## 2011-01-28 MED ORDER — PREDNISONE 20 MG PO TABS: 40.0000 mg | ORAL_TABLET | Freq: Every day | ORAL | Status: DC

## 2011-01-28 MED ORDER — HYDROCOD POLST-CHLORPHEN POLST 10-8 MG/5ML PO LQCR: 5.0000 mL | Freq: Two times a day (BID) | ORAL | Status: DC | PRN

## 2011-01-28 MED ORDER — IPRATROPIUM BROMIDE 0.02 % IN SOLN
0.5000 mg | Freq: Once | RESPIRATORY_TRACT | Status: AC
  Administered 2011-01-28: 0.5 mg via RESPIRATORY_TRACT
  Filled 2011-01-28: qty 2.5

## 2011-01-28 MED ORDER — HYDROCODONE-ACETAMINOPHEN 5-325 MG PO TABS
1.0000 | ORAL_TABLET | Freq: Once | ORAL | Status: AC
  Administered 2011-01-28: 1 via ORAL
  Filled 2011-01-28: qty 1

## 2011-01-28 MED ORDER — IBUPROFEN 800 MG PO TABS
800.0000 mg | ORAL_TABLET | Freq: Once | ORAL | Status: AC
  Administered 2011-01-28: 800 mg via ORAL
  Filled 2011-01-28: qty 1

## 2011-01-28 MED ORDER — ALBUTEROL SULFATE HFA 108 (90 BASE) MCG/ACT IN AERS
2.0000 | INHALATION_SPRAY | RESPIRATORY_TRACT | Status: DC | PRN
  Filled 2011-01-28: qty 6.7

## 2011-01-28 NOTE — ED Provider Notes (Signed)
History     CSN: [id number] Arrival date & time: 01/28/2011  3:11 PM   First MD Initiated Contact with Patient 01/28/11 2019      Chief Complaint  Patient presents with  . Chest Pain  . Cough     Patient is a 64 y.o. female presenting with cough. The history is provided by the patient.  Cough This is a new problem. The current episode started more than 1 week ago. The problem occurs every few minutes. The problem has been gradually worsening. The cough is non-productive. There has been no fever. Associated symptoms include chest pain, headaches and wheezing. She has tried decongestants and cough syrup for the symptoms.  Reports 3 wk hx of persistent dry cough. Denies fever, or other associated sx's. Over the past several days she has begun to have sharp chest wall and upper back pain that worsens w/ coughing and deep breathing. Seen by PCP on 01/25/2011 and told she had normal xr and that PCP thought it was GERD related. Pt w/ Hx of asthma and states the inhalers help for abrief period of time but then cough resumes.Pt actively coughing during interview.  Past Medical History  Diagnosis Date  . Asthma   . Diabetes mellitus   . GERD (gastroesophageal reflux disease)   . Hypertension   . Hyperlipidemia     Past Surgical History  Procedure Date  . Cholecystectomy 2006  . Tubal ligation 1988  . Cesarean section 1988  . Fracture surgery 1992    lEFT ANKLE orif    Family History  Problem Relation Age of Onset  . Cancer Mother 14    lUNG CANCER  . Heart disease Father 61    History  Substance Use Topics  . Smoking status: Former Games developer  . Smokeless tobacco: Not on file   Comment: stopped in1975  . Alcohol Use: No    OB History    Grav Para Term Preterm Abortions TAB SAB Ect Mult Living                  Review of Systems  Respiratory: Positive for cough and wheezing.   Cardiovascular: Positive for chest pain.  Musculoskeletal: Positive for back pain.    Neurological: Positive for headaches.    Allergies  Other  Home Medications   Current Outpatient Rx  Name Route Sig Dispense Refill  . ALBUTEROL SULFATE HFA 108 (90 BASE) MCG/ACT IN AERS Inhalation Inhale 2 puffs into the lungs every 4 (four) hours as needed for shortness of breath. 3 each 4  . ASPIRIN 81 MG PO TBEC Oral Take 81 mg by mouth daily. With meal; for heart protection     . GLIMEPIRIDE 4 MG PO TABS Oral Take 1 tablet (4 mg total) by mouth 2 (two) times daily. 90 tablet 4  . INSULIN GLARGINE 100 UNIT/ML [place] SOLN Subcutaneous Inject 16 Units into the skin at bedtime.      Marland Kitchen METFORMIN HCL 1000 MG PO TABS Oral Take 1,000 mg by mouth 2 (two) times daily with a meal.      . OMEPRAZOLE 40 MG PO CPDR Oral Take 40 mg by mouth daily.      Marland Kitchen PRAVASTATIN SODIUM 20 MG PO TABS Oral Take 1 tablet (20 mg total) by mouth at bedtime. 90 tablet 4  . PRESCRIPTION MEDICATION Oral Take 1 tablet by mouth daily as needed. Zyrtec For allergy like symptoms.       BP 145/96  Pulse 113  Temp(Src)  99.5 F (37.5 C) (Oral)  Resp 18  SpO2 96%  Physical Exam  Constitutional: She is oriented to person, place, and time. She appears well-developed and well-nourished.  HENT:  Head: Normocephalic and atraumatic.  Eyes: Conjunctivae are normal.  Neck: Neck supple.  Cardiovascular: Normal rate and regular rhythm.   Pulmonary/Chest: Effort normal and breath sounds normal.  Abdominal: Soft. Bowel sounds are normal.  Musculoskeletal: Normal range of motion.       Arms: Neurological: She is alert and oriented to person, place, and time.  Skin: Skin is warm and dry. Rash noted. Rash is papular. No erythema.  Psychiatric: She has a normal mood and affect.    ED Course  Procedures Pt reports feeling much better w/ neb, cough med and medication for pain. Coughing has greatly improved  Labs Reviewed  DIFFERENTIAL - Abnormal; Notable for the following:    Eosinophils Relative 13 (*)    Eosinophils  Absolute 0.9 (*)    All other components within normal limits  CBC  POCT I-STAT TROPONIN I  I-STAT TROPONIN I   Dg Chest 2 View  01/28/2011  *RADIOLOGY REPORT*  Clinical Data: Cough, shortness of breath  CHEST - 2 VIEW  Comparison: 11/24/2007  Findings: Cardiomediastinal silhouette is unremarkable.  No acute infiltrate or pleural effusion.  No pulmonary edema.  Bony thorax is stable.  IMPRESSION: No active disease.  Original Report Authenticated By: Natasha Mead, M.D.     No diagnosis found.    MDM  HPI, PE and findings c/w asthmatic brochitis.        Leanne Chang, NP 01/30/11 [phone]

## 2011-01-28 NOTE — ED Notes (Signed)
Patient presents with cough x 3 weeks and chest pain x 2 days with radiation to lower back.  Patient reporting pain 8/10.

## 2011-01-28 NOTE — Telephone Encounter (Signed)
Patient called for worsening cough. I saw her on 01/25/2011 and started her on Prilosec for GERD and advised her to continue taking over-the-counter allergy pills and albuterol inhaler. She says that her cough is getting worse and she's having headache and back pain along with that- she feels sick and has a yellow sputum once in a while coming with cough.  She is going to the ER for further evaluation and is waiting for her friend to pick her up.

## 2011-01-30 NOTE — ED Provider Notes (Signed)
Evaluation and management procedures were performed by the PA/NP under my supervision/collaboration.   Dione Booze, MD 01/30/11 [phone]

## 2011-02-05 ENCOUNTER — Telehealth: Payer: Self-pay | Admitting: *Deleted

## 2011-02-05 NOTE — Telephone Encounter (Signed)
Pt called to state she was seen in ED on 12/10 for Asthmatic Bronchitis.  She was treated with meds and finished the prednisone yesterday. She is better but still has a cough.  Should she be scheduled for a ED f/u? Pt # [id number]

## 2011-02-06 NOTE — Telephone Encounter (Signed)
She should be seen for a follow up ONLY if she is not improving. Thank you.

## 2011-02-07 ENCOUNTER — Ambulatory Visit (INDEPENDENT_AMBULATORY_CARE_PROVIDER_SITE_OTHER): Payer: Self-pay | Admitting: Internal Medicine

## 2011-02-07 ENCOUNTER — Encounter: Payer: Self-pay | Admitting: Internal Medicine

## 2011-02-07 VITALS — BP 127/85 | HR 97 | Temp 98.0°F | Ht 67.0 in | Wt 177.8 lb

## 2011-02-07 DIAGNOSIS — IMO0001 Reserved for inherently not codable concepts without codable children: Secondary | ICD-10-CM

## 2011-02-07 DIAGNOSIS — M791 Myalgia, unspecified site: Secondary | ICD-10-CM

## 2011-02-07 DIAGNOSIS — J45909 Unspecified asthma, uncomplicated: Secondary | ICD-10-CM

## 2011-02-07 DIAGNOSIS — E119 Type 2 diabetes mellitus without complications: Secondary | ICD-10-CM

## 2011-02-07 LAB — COMPREHENSIVE METABOLIC PANEL
ALT: 44 U/L — ABNORMAL HIGH (ref 0–35)
AST: 33 U/L (ref 0–37)
Albumin: 4.3 g/dL (ref 3.5–5.2)
Alkaline Phosphatase: 52 U/L (ref 39–117)
BUN: 15 mg/dL (ref 6–23)
CO2: 26 mEq/L (ref 19–32)
Calcium: 9.7 mg/dL (ref 8.4–10.5)
Chloride: 101 mEq/L (ref 96–112)
Creat: 0.82 mg/dL (ref 0.50–1.10)
Glucose, Bld: 135 mg/dL — ABNORMAL HIGH (ref 70–99)
Potassium: 3.8 mEq/L (ref 3.5–5.3)
Sodium: 137 mEq/L (ref 135–145)
Total Bilirubin: 0.4 mg/dL (ref 0.3–1.2)
Total Protein: 7.2 g/dL (ref 6.0–8.3)

## 2011-02-07 LAB — CK: Total CK: 68 U/L (ref 7–177)

## 2011-02-07 LAB — POCT GLYCOSYLATED HEMOGLOBIN (HGB A1C): Hemoglobin A1C: 9

## 2011-02-07 LAB — GLUCOSE, CAPILLARY: Glucose-Capillary: 153 mg/dL — ABNORMAL HIGH (ref 70–99)

## 2011-02-07 MED ORDER — IPRATROPIUM BROMIDE HFA 17 MCG/ACT IN AERS: 2.0000 | INHALATION_SPRAY | Freq: Four times a day (QID) | RESPIRATORY_TRACT | Status: DC

## 2011-02-07 MED ORDER — INSULIN GLARGINE 100 UNIT/ML ~~LOC~~ SOLN: 18.0000 [IU] | Freq: Every day | SUBCUTANEOUS | Status: DC

## 2011-02-07 NOTE — Telephone Encounter (Signed)
Appointment scheduled.

## 2011-02-07 NOTE — Patient Instructions (Addendum)
Please, stop taking Pravastatin until body aches resolve, then restart. Please, Hold aspirin until Asthma symptoms are controlled. Please, note a change in your insulin regimen: increase to 18 units WITH breakfast. Please, bring your glucometer with you each visit. Please, schedule an appointment with Lupita Leash Plyler ASAP. Please, return to clinic in 1-2 weeks if not feeling better and call with any concerns. Otherwise, call 911 or go to ED.

## 2011-02-07 NOTE — Progress Notes (Signed)
Patient ID: Alyssa Haynes, female   DOB: [birth year], 65 y.o.   MRN: [id number] Subjective:   Patient ID: Alyssa Haynes female   DOB: 1945-05-02 65 y.o.   MRN: [id number]  HPI: [name] is a 65 y.o.  Woman is here for a F/u from ED on 01/31/11 for asthma exacerbation. Patient states that CXR was taken in ED and was normal (which I reviewed personally); and that she was discharged with a "steroid taper." Patient states that she stopped taking steroids 3 days ago "because ran out." However, per ED records, its therapy should have lasted until today 02/07/11. So it is unclear whether the patient took her steroid taper incorrectly. At approximately the same time, she developed "sore muscles all over," as if she "worked out." She denies any trauma or similar episodes in the past. She denies any hematuria, dysuria or urinary retention, oliguria or polyuria.    Past Medical History  Diagnosis Date  . Asthma   . Diabetes mellitus   . GERD (gastroesophageal reflux disease)   . Hypertension   . Hyperlipidemia    Current Outpatient Prescriptions  Medication Sig Dispense Refill  . albuterol (PROVENTIL HFA) 108 (90 BASE) MCG/ACT inhaler Inhale 2 puffs into the lungs every 4 (four) hours as needed for shortness of breath.  3 each  4  . aspirin (ASPIR-LOW) 81 MG EC tablet Take 81 mg by mouth daily. With meal; for heart protection       . hydrochlorothiazide (HYDRODIURIL) 25 MG tablet TAKE ONE TABLET BY MOUTH IN THE MORNING  30 tablet  11  . metFORMIN (GLUCOPHAGE) 1000 MG tablet Take 1,000 mg by mouth 2 (two) times daily with a meal.        . omeprazole (PRILOSEC) 40 MG capsule Take 40 mg by mouth daily.        . pravastatin (PRAVACHOL) 20 MG tablet Take 1 tablet (20 mg total) by mouth at bedtime.  90 tablet  4  . DISCONTD: insulin glargine (LANTUS SOLOSTAR) 100 UNIT/ML injection Inject 16 Units into the skin at bedtime.        . insulin glargine (LANTUS SOLOSTAR) 100 UNIT/ML injection  Inject 18 Units into the skin at bedtime. Inject 18 Units into the skin at breakfast time once a day.  10 mL  11  . ipratropium (ATROVENT HFA) 17 MCG/ACT inhaler Inhale 2 puffs into the lungs 4 (four) times daily.  1 Inhaler  2   Family History  Problem Relation Age of Onset  . Cancer Mother 80    lUNG CANCER  . Heart disease Father 38   History   Social History  . Marital Status: Married    Spouse Name: N/A    Number of Children: N/A  . Years of Education: N/A   Social History Main Topics  . Smoking status: Former Games developer  . Smokeless tobacco: None   Comment: stopped in1975  . Alcohol Use: No  . Drug Use: No  . Sexually Active: Yes    Birth Control/ Protection: Surgical   Other Topics Concern  . None   Social History Narrative  . None   Review of Systems: Constitutional: Denies fever, chills, diaphoresis, appetite change and  Endorses fatigue.  HEENT: Denies photophobia, eye pain, redness, hearing loss, ear pain, congestion, sore throat, rhinorrhea, sneezing, mouth sores, trouble swallowing, neck pain, neck stiffness and tinnitus.   Respiratory: Confirms SOB, dry cough,  and wheezing which are relived by using albuterol on a bid  basis..   Cardiovascular: Denies chest pain, palpitations and leg swelling.  Gastrointestinal: Denies nausea, vomiting, abdominal pain, diarrhea, constipation, blood in stool and abdominal distention.  Genitourinary: Denies dysuria, urgency, frequency, hematuria, flank pain and difficulty urinating.  Musculoskeletal: Denies myalgias, back pain, joint swelling, arthralgias and gait problem.  Skin: Denies pallor, rash and wound.  Neurological: Denies dizziness, seizures, syncope, weakness, light-headedness, numbness and headaches.  Hematological: Denies adenopathy. Easy bruising, personal or family bleeding history  Psychiatric/Behavioral: Denies suicidal ideation, mood changes, confusion, nervousness, sleep disturbance and agitation  Objective:    Physical Exam: Filed Vitals:   02/07/11 1336  BP: 127/85  Pulse: 97  Temp: 98 F (36.7 C)  TempSrc: Oral  Height: 5\' 7"  (1.702 m)  Weight: 177 lb 12.8 oz (80.65 kg)   Constitutional: Vital signs reviewed.  Patient is in no acute distress and cooperative with exam. Alert and oriented x3.  Head: Normocephalic and atraumatic Ear: TM normal bilaterally Mouth: no erythema or exudates, MMM Eyes: PERRL, EOMI, conjunctivae normal, No scleral icterus.  Neck: Supple, Trachea midline normal ROM, No JVD, mass, thyromegaly, or carotid bruit present.  Cardiovascular: RRR, S1 normal, S2 normal, no MRG, pulses symmetric and intact bilaterally Pulmonary/Chest: no intercostal muscle retractions with mild wheezes, rales, or rhonchi bilaterally. Abdominal: Soft. Non-tender, non-distended, bowel sounds are normal, no masses, organomegaly, or guarding present.  GU: no CVA tenderness Musculoskeletal: No joint deformities, erythema, or stiffness, ROM full and no nontender Hematology: no cervical, inginal, or axillary adenopathy.  Neurological: A&O x3, Strenght is normal and symmetric bilaterally, cranial nerve II-XII are grossly intact, no focal motor deficit, sensory intact to light touch bilaterally.  Skin: Warm, dry and intact. No rash, cyanosis, or clubbing.  Psychiatric: Normal mood and affect. speech and behavior is normal. Judgment and thought content normal. Cognition and memory are normal.   Assessment & Plan:   1. Acute asthma exacerbation without status asthmaticus. -Add Ipratropium HFA -Will not Rx oral or inhaled steroids at this time due to poorly controlled DM and myalgias sp medrol dose pack use from ED. -Hold ASA for now - increase -Reviewed with the patient a proper use of inhalers. -hydration, rest; avoid exposure to allergens. - uptodate on flu and pneumonia vaccination.  2. Myalgias sp use of oral steroids (?) - Hold pravastatin for now -check CK level and renal  function -hydration  3. DM, type 2/insulin dependent -will D/C Amaryl -increase Lantus dose to 18 units SQ q AM (due to the patient's poor dinner habits and risk of hypoglycemia -Strongly advised to f/u with DME -foot care reviewed.

## 2011-02-20 ENCOUNTER — Telehealth: Payer: Self-pay | Admitting: *Deleted

## 2011-02-20 ENCOUNTER — Ambulatory Visit (INDEPENDENT_AMBULATORY_CARE_PROVIDER_SITE_OTHER): Payer: Self-pay | Admitting: Dietician

## 2011-02-20 DIAGNOSIS — E119 Type 2 diabetes mellitus without complications: Secondary | ICD-10-CM

## 2011-02-20 NOTE — Progress Notes (Signed)
Medical Nutrition Therapy:  Appt start time: 0900 end time:  1000.  Assessment:  Primary concerns today: Blood sugar control and Meal planning Usual eating pattern includes Meal 3 and 1+ snacks per day.   Coffee in am with sweetener, couscous, oatmeal Avoided foods include: cookies and candy, soda and french fries. Reports spouse not supportive in her efforts to eat healthier. Usual physical activity includes goes to [employer] on occasion, thinking about restarting exercise. Estimated needs ~1800 calories, 200 grams carb/day, 65 grams fat/day.    Progress Towards Goal(s):  In progress   Nutritional Diagnosis:  NB-1.1 Food and nutrition-related knowledge deficit As related to lack of prior exposure to carb counting.  As evidenced by her report nad food recall. .   Diagnosis and Intervention: 1- Education regarding carb counting 2- Social support- recommended exercise partner, bringing friends and support people to visits and attending diabetes support group 3- Education regarding diabetes medication action and self monitoring. 4- Coordination of care- relay message from patient regarding her aspirin and statin. Consider allowing pattient to increase insulin as needed to maintain fasting blood sugar at 90-130- free lantus card provided today    Monitoring/Evaluation:  Dietary intake prn- patient does not have insurance at this point.    Blood sugar today in office 262 after coffee with sweetener- was 207 at home. Fasting blood sugars: 308,[phone]

## 2011-02-20 NOTE — Patient Instructions (Addendum)
Plan for your meals:   50-60 grams carb for 3 meals a day and 40-45 for snacks each day. ( total ~ 200 grams carbohydrate per day)   Please call if needed Alyssa Haynes [phone].  Message was relayed to your doctor today regarding your aspirin and statin.   Also- may wish to discuss with doctor: Consider allowing patient to increase insulin 1-2 units every few days as needed to maintain fasting blood sugar at 90-130 if lifestyle- diet & exercise not effective in doing same over next few weeks.

## 2011-02-20 NOTE — Telephone Encounter (Signed)
Pt called to see if she can restart pravastatin and asa.  Will she need to be seen or can she just restart? Pt # [id number]

## 2011-02-26 ENCOUNTER — Telehealth: Payer: Self-pay | Admitting: Dietician

## 2011-02-26 NOTE — Telephone Encounter (Signed)
Pt left message : Since changing her insulin to in the morning, after she takes it she gets a dizzy and sinking feeling and doesn't have as much energy,   And her numbers really have not come down, this am her fasting was 187. Returned call and got answering machine: sugtested she check her blood sugar when that happens and treat accordingly  as she is describing symptoms of low blood sugar.  Will try back again.

## 2011-02-28 NOTE — Telephone Encounter (Signed)
Left another message for patient to contact CDE as needed.

## 2011-03-01 ENCOUNTER — Ambulatory Visit (INDEPENDENT_AMBULATORY_CARE_PROVIDER_SITE_OTHER): Payer: Self-pay | Admitting: Internal Medicine

## 2011-03-01 ENCOUNTER — Encounter: Payer: Self-pay | Admitting: Internal Medicine

## 2011-03-01 DIAGNOSIS — E785 Hyperlipidemia, unspecified: Secondary | ICD-10-CM

## 2011-03-01 DIAGNOSIS — E119 Type 2 diabetes mellitus without complications: Secondary | ICD-10-CM

## 2011-03-01 DIAGNOSIS — I10 Essential (primary) hypertension: Secondary | ICD-10-CM

## 2011-03-01 NOTE — Patient Instructions (Signed)
Increase your Lantus to 19 units each night.  Please take your blood sugars twice per day.  Your goal blood sugar is less than 130.    If your blood sugars are all over 130 for three days, increase your lantus by one unit. If your blood sugars are all over 130 again for another three days, increase your lantus by another unit.   Please continue to increase by one unit every three days until you measure a blood sugar <130, then stop increasing.   Do not increase your Lantus beyond 24 units each night.  If you get to 24 units and your sugars are still always over 130, call us.  Once you have reached your goal, check twice per day for 7 days, then just check your sugar once each morning.  If you feel weak, very nauseated, or dizzy, check your blood sugar immediately.  If it is <80, drink some juice.  If it is <70, drink some juice and call us.  Please restart your pravastatin.  Return to clinic in 6 weeks.

## 2011-03-02 NOTE — Assessment & Plan Note (Signed)
BP is too high today for patient with DM.  However, this was taken after she had rushed to get to the appt, and she missed her HCTZ this morning.  Will recheck at follow-up and told her to make sure to take her HCTZ daily including day of next appt.  Return in 6 weeks

## 2011-03-02 NOTE — Assessment & Plan Note (Signed)
Since CK was normal last visit during myalgias, will restart pravastatin today.  Patient told to stop it immediately if myalgias return.

## 2011-03-02 NOTE — Progress Notes (Signed)
Subjective:   Patient ID: Alyssa Haynes female   DOB: 1945-08-23 66 y.o.   MRN: [id number]  HPI: [name] is a 66 y.o. with DM, HTN, HLD presents for follow-up.  She has had poor DM control at her last two HbA1c checks  She says this is because she had not been taking her meds/lantus regularly until her last visit.  Since then she has had good compliance and has been inspired to take better care of herself.    She now uses Lantus QHS.  Her AM blood sugars run 180-200.  She only checks her sugars in the AM.  She had previously been taking her Lantus in the morning. However, she was consistently having nausea and lightheadedness starting 30 minutes after taking her Lantus in the morning, with these symptoms lasting 30 minutes.  She never checked her CBG during these episodes.  Since switching Lantus to QHS she has not had such an episode.  She had myalgias in her legs during prednisone taper in December.  These lasted three days.  Pravastatin was stopped, and myalgias resolved 1 day later.  However, CK was normal at the time.    She reports that she has been pretty compliant on her medicines recently.      Past Medical History  Diagnosis Date  . Asthma   . Diabetes mellitus   . GERD (gastroesophageal reflux disease)   . Hypertension   . Hyperlipidemia    Current Outpatient Prescriptions  Medication Sig Dispense Refill  . albuterol (PROVENTIL HFA) 108 (90 BASE) MCG/ACT inhaler Inhale 2 puffs into the lungs every 4 (four) hours as needed for shortness of breath.  3 each  4  . aspirin (ASPIR-LOW) 81 MG EC tablet Take 81 mg by mouth daily. With meal; for heart protection       . hydrochlorothiazide (HYDRODIURIL) 25 MG tablet TAKE ONE TABLET BY MOUTH IN THE MORNING  30 tablet  11  . insulin glargine (LANTUS SOLOSTAR) 100 UNIT/ML injection Inject 18 Units into the skin at bedtime. Inject 18 Units into the skin at breakfast time once a day.  10 mL  11  . ipratropium (ATROVENT  HFA) 17 MCG/ACT inhaler Inhale 2 puffs into the lungs 4 (four) times daily.  1 Inhaler  2  . metFORMIN (GLUCOPHAGE) 1000 MG tablet Take 1,000 mg by mouth 2 (two) times daily with a meal.        . omeprazole (PRILOSEC) 40 MG capsule Take 40 mg by mouth daily.        . pravastatin (PRAVACHOL) 20 MG tablet Take 1 tablet (20 mg total) by mouth at bedtime.  90 tablet  4   Family History  Problem Relation Age of Onset  . Cancer Mother 1    lUNG CANCER  . Heart disease Father 29   History   Social History  . Marital Status: Married    Spouse Name: N/A    Number of Children: N/A  . Years of Education: N/A   Social History Main Topics  . Smoking status: Former Games developer  . Smokeless tobacco: None   Comment: stopped in1975  . Alcohol Use: No  . Drug Use: No  . Sexually Active: Yes    Birth Control/ Protection: Surgical   Other Topics Concern  . None   Social History Narrative  . None   Review of Systems: Constitutional: Denies fever, chills, diaphoresis Respiratory: Denies SOB, DOE, cough, chest tightness,  and wheezing.   Cardiovascular: Denies  chest pain, palpitations and leg swelling.  Gastrointestinal: Denies vomiting, abdominal pain, diarrhea Musculoskeletal: Denies myalgias Neurological: Denies dizziness, seizures, syncope, weakness   Objective:  Physical Exam: Filed Vitals:   03/01/11 1547  BP: 146/85  Pulse: 93  Temp: 97.2 F (36.2 C)  TempSrc: Oral  Height: 5\' 7"  (1.702 m)  Weight: 177 lb 6.4 oz (80.468 kg)  SpO2: 97%   Constitutional: Vital signs reviewed.  Patient is a well-developed and well-nourished woman in no acute distress and cooperative with exam. Alert and oriented x3.  Head: Normocephalic and atraumatic Mouth: no erythema or exudates, MMM Eyes: PERRL, EOMI, conjunctivae normal, No scleral icterus.  Neck: No JVD Cardiovascular: RRR, S1 normal, S2 normal, no MRG, pulses symmetric and intact bilaterally Pulmonary/Chest: CTAB, no wheezes, rales, or  rhonchi Neurological: A&O x3, Strength is normal and symmetric bilaterally, cranial nerve II-XII are grossly intact, no focal motor deficit, sensory intact to light touch bilaterally.  Skin: Warm, dry and intact. No rash, cyanosis, or clubbing.     Assessment & Plan:

## 2011-03-02 NOTE — Assessment & Plan Note (Addendum)
AM sugars all above 150, usually 180-200.  On Lantus 18 units QHS,  Will have her check sugars twice per day and titrate up lantus dose to goal sugar <130 as per patient instructions.  Patient is to call clinic if she has hypoglycemia.  She is working on getting appt with [facility]- we are calling over there to see if we can help her as she is self pay.  Follow-up 6 weeks.  Her nausea and lightheadedness after taking Lantus in the morning sounds like hypoglycemia.  She was instructed to check sugar if she has these symptoms again, but she has stopped having these symptoms since switching Lantus to QHS.  Continue QHS lantus.

## 2011-03-04 NOTE — Progress Notes (Signed)
I agree with Dr. Wainwright's assessment and plan. 

## 2011-03-05 ENCOUNTER — Ambulatory Visit (HOSPITAL_COMMUNITY)
Admission: RE | Admit: 2011-03-05 | Discharge: 2011-03-05 | Disposition: A | Discharge status: Home / Self Care | Payer: Self-pay | Source: Ambulatory Visit | Attending: Internal Medicine | Admitting: Internal Medicine

## 2011-03-05 DIAGNOSIS — Z1231 Encounter for screening mammogram for malignant neoplasm of breast: Secondary | ICD-10-CM

## 2011-03-07 NOTE — Telephone Encounter (Signed)
Office visit on 1/11

## 2011-03-12 ENCOUNTER — Telehealth: Payer: Self-pay | Admitting: Dietician

## 2011-03-12 ENCOUNTER — Telehealth: Payer: Self-pay | Admitting: *Deleted

## 2011-03-12 NOTE — Telephone Encounter (Signed)
Pt calls and c/o since increasing insulin she is having some gi symptoms, transferred to [facility].

## 2011-03-12 NOTE — Telephone Encounter (Signed)
Patient having slight nausea intermittently ( every other day or so) and diarrhea for the past few days- she wonders if it is being caused by the insulin or insulin increase? She is eating healthier than she had, blood sugars all > 160, has not added new medicines or supplements other than the pravastatin. Discussed that it may be either the increased fiber that she may not be used to yet or stomach bug. She feels fine otherwise. She will back off on high fiber some to see if that helps and call as needed.

## 2011-03-15 NOTE — Telephone Encounter (Signed)
Please, have the patient make an appointment first. Thank you.

## 2011-03-15 NOTE — Telephone Encounter (Signed)
Pt has been seen and also has another f/u appointment

## 2011-03-26 ENCOUNTER — Telehealth: Payer: Self-pay | Admitting: *Deleted

## 2011-03-26 NOTE — Telephone Encounter (Signed)
Pt called with c/o elevated cbg.  The lowest cbg is 179, she has increased Lantus Insulin to 23 units.  Last order was for lantus 18 units daily on 12/20.  Pt also on metformin 1,000 mg twice a day. Pt states on last visit her cbg's were in the 200's.  She has been increasing insulin by 1 unit every three days.  She was told to check in when she reached 23 units.   She has not started exercising yet but plans to. Please advise

## 2011-03-27 NOTE — Telephone Encounter (Signed)
I talked with Dr Denton Meek and reviewed pt's phone message.   She states pt can continue to increase Lantus at 1 unit at a time until she reaches 28 units a day.  If this does not bring her CBG down to 160 and below then she should make an appointment in clinic and be seen. Also she would like Lupita Leash Plyler to talk with pt. I will send this to Lupita Leash and have her call pt.

## 2011-03-27 NOTE — Telephone Encounter (Signed)
Tried calling patient: no answer. Will try again later.   Called her again. She says blood sugar is coming down slowly, [place] [facility] [place] a place to exercise at home. Stomach problems better. Trying to eat healthier, not reading labels for carbs, but thinking about starting. She verbalizes understanding to new lantus insulin maximum of 28 units a day. Her Target fasting blood sugar: < 130 . Provided encouragement and support.

## 2011-04-03 ENCOUNTER — Telehealth: Payer: Self-pay | Admitting: Dietician

## 2011-04-03 NOTE — Telephone Encounter (Signed)
Needs hard copy rx for insulin for her to be able to use coupon. Please note her current dose is up to 28 units a day.  Front office or I can mail the prescription to patient.

## 2011-04-05 ENCOUNTER — Other Ambulatory Visit: Payer: Self-pay | Admitting: Internal Medicine

## 2011-04-05 DIAGNOSIS — E119 Type 2 diabetes mellitus without complications: Secondary | ICD-10-CM

## 2011-04-05 DIAGNOSIS — M791 Myalgia, unspecified site: Secondary | ICD-10-CM

## 2011-04-05 DIAGNOSIS — J45909 Unspecified asthma, uncomplicated: Secondary | ICD-10-CM

## 2011-04-05 MED ORDER — INSULIN GLARGINE 100 UNIT/ML ~~LOC~~ SOLN: 18.0000 [IU] | Freq: Every day | SUBCUTANEOUS | Status: DC

## 2011-04-05 NOTE — Telephone Encounter (Signed)
Received prescription. Thank you.  Left several messages for patient to find out if she wants prescription mailed or will pick up. Will put in mail.

## 2011-04-24 ENCOUNTER — Other Ambulatory Visit: Payer: Self-pay | Admitting: *Deleted

## 2011-04-24 DIAGNOSIS — J40 Bronchitis, not specified as acute or chronic: Secondary | ICD-10-CM

## 2011-04-24 NOTE — Telephone Encounter (Signed)
Pt called with c/o ? Bronchitis and wants cough med. Pt # [id number]

## 2011-04-26 ENCOUNTER — Other Ambulatory Visit: Payer: Self-pay | Admitting: Internal Medicine

## 2011-04-26 MED ORDER — GUAIFENESIN-CODEINE 100-10 MG/5ML PO SYRP: 5.0000 mL | ORAL_SOLUTION | Freq: Three times a day (TID) | ORAL | Status: AC | PRN

## 2011-04-26 NOTE — Telephone Encounter (Signed)
Called to [facility] [phone]

## 2011-06-03 ENCOUNTER — Encounter: Payer: Self-pay | Admitting: Internal Medicine

## 2011-06-03 ENCOUNTER — Ambulatory Visit (INDEPENDENT_AMBULATORY_CARE_PROVIDER_SITE_OTHER): Payer: Self-pay | Admitting: Internal Medicine

## 2011-06-03 ENCOUNTER — Telehealth: Payer: Self-pay | Admitting: Dietician

## 2011-06-03 VITALS — BP 132/85 | HR 101 | Temp 97.8°F | Wt 176.3 lb

## 2011-06-03 DIAGNOSIS — E785 Hyperlipidemia, unspecified: Secondary | ICD-10-CM

## 2011-06-03 DIAGNOSIS — J45909 Unspecified asthma, uncomplicated: Secondary | ICD-10-CM

## 2011-06-03 DIAGNOSIS — I10 Essential (primary) hypertension: Secondary | ICD-10-CM

## 2011-06-03 DIAGNOSIS — Z79899 Other long term (current) drug therapy: Secondary | ICD-10-CM

## 2011-06-03 DIAGNOSIS — E119 Type 2 diabetes mellitus without complications: Secondary | ICD-10-CM

## 2011-06-03 LAB — POCT GLYCOSYLATED HEMOGLOBIN (HGB A1C): Hemoglobin A1C: 10.7

## 2011-06-03 LAB — GLUCOSE, CAPILLARY: Glucose-Capillary: 237 mg/dL — ABNORMAL HIGH (ref 70–99)

## 2011-06-03 MED ORDER — INSULIN GLARGINE 100 UNIT/ML ~~LOC~~ SOLN: 30.0000 [IU] | Freq: Every day | SUBCUTANEOUS | Status: DC

## 2011-06-03 MED ORDER — PRAVASTATIN SODIUM 20 MG PO TABS
20.0000 mg | ORAL_TABLET | Freq: Every day | ORAL | Status: DC
Start: 2011-06-03 — End: 2011-10-01

## 2011-06-03 MED ORDER — METFORMIN HCL 1000 MG PO TABS
1000.0000 mg | ORAL_TABLET | Freq: Two times a day (BID) | ORAL | Status: DC
Start: 2011-06-03 — End: 2011-10-01

## 2011-06-03 MED ORDER — ALBUTEROL SULFATE HFA 108 (90 BASE) MCG/ACT IN AERS: 2.0000 | INHALATION_SPRAY | RESPIRATORY_TRACT | Status: DC | PRN

## 2011-06-03 MED ORDER — HYDROCHLOROTHIAZIDE 25 MG PO TABS: 25.0000 mg | ORAL_TABLET | Freq: Every day | ORAL | Status: DC

## 2011-06-03 MED ORDER — OMEPRAZOLE 40 MG PO CPDR: 40.0000 mg | DELAYED_RELEASE_CAPSULE | Freq: Every day | ORAL | Status: DC

## 2011-06-03 NOTE — Patient Instructions (Signed)
Please, increase Glargin dose up to 30 units before bedtime. Please, check your sugars as instructed and bring your meter next visit. Please, return to clinic in 4 weeks.

## 2011-06-03 NOTE — Progress Notes (Signed)
Patient ID: Alyssa Haynes, female   DOB: Mar 20, 1945, 66 y.o.   MRN: [id number] HPI:    1. DM, type 2. Patient reports compliance with insulin regimen. Denies any hypoglycemia. Does not check her CBG's as instructed and did not bring her meter.  2. Chronic wet cough for 1 month; reports nasal congestion; denies any fever, chills, sputum production, wheezing, SOB, CP, swelling. Review of Systems: Negative except per history of present illness  Physical Exam:  Nursing notes and vitals reviewed General:  alert, well-developed, and cooperative to examination.   Lungs:  normal respiratory effort, no accessory muscle use, normal breath sounds, no crackles, and no wheezes. Heart:  normal rate, regular rhythm, no murmurs, no gallop, and no rub.   Abdomen:  soft, non-tender, normal bowel sounds, no distention, no guarding, no rebound tenderness, no hepatomegaly, and no splenomegaly.   Extremities:  No cyanosis, clubbing, edema Neurologic:  alert & oriented X3, nonfocal exam  Meds: Medications Prior to Admission  Medication Sig Dispense Refill  . albuterol (PROVENTIL HFA) 108 (90 BASE) MCG/ACT inhaler Inhale 2 puffs into the lungs every 4 (four) hours as needed for shortness of breath.  3 each  4  . aspirin (ASPIR-LOW) 81 MG EC tablet Take 81 mg by mouth daily. With meal; for heart protection       . hydrochlorothiazide (HYDRODIURIL) 25 MG tablet TAKE ONE TABLET BY MOUTH IN THE MORNING  30 tablet  11  . insulin glargine (LANTUS SOLOSTAR) 100 UNIT/ML injection Inject 18 Units into the skin at bedtime. Inject 18 Units into the skin at breakfast time once a day.  15 mL  11  . ipratropium (ATROVENT HFA) 17 MCG/ACT inhaler Inhale 2 puffs into the lungs 4 (four) times daily.  1 Inhaler  2  . metFORMIN (GLUCOPHAGE) 1000 MG tablet Take 1,000 mg by mouth 2 (two) times daily with a meal.        . omeprazole (PRILOSEC) 40 MG capsule Take 40 mg by mouth daily.        . pravastatin (PRAVACHOL) 20 MG tablet Take  1 tablet (20 mg total) by mouth at bedtime.  90 tablet  4   No current facility-administered medications on file as of 06/03/2011.    Allergies: Other Past Medical History  Diagnosis Date  . Asthma   . Diabetes mellitus   . GERD (gastroesophageal reflux disease)   . Hypertension   . Hyperlipidemia    Past Surgical History  Procedure Date  . Cholecystectomy 2006  . Tubal ligation 1988  . Cesarean section 1988  . Fracture surgery 1992    lEFT ANKLE orif   Family History  Problem Relation Age of Onset  . Cancer Mother 77    lUNG CANCER  . Heart disease Father 36   History   Social History  . Marital Status: Married    Spouse Name: N/A    Number of Children: N/A  . Years of Education: N/A   Occupational History  . Not on file.   Social History Main Topics  . Smoking status: Former Games developer  . Smokeless tobacco: Not on file   Comment: stopped in1975  . Alcohol Use: No  . Drug Use: No  . Sexually Active: Yes    Birth Control/ Protection: Surgical   Other Topics Concern  . Not on file   Social History Narrative  . No narrative on file    A/P: 1. DM, type 2, poorly controlled. -HgbA1C continues to worsen. -Patient  admits being noncompliant with her diet, exercise regimen. -increase Glargin dose up to 30 units SQ qhs.signs of hypoglycemia reviewed. -instructed to start checking CBG's and bring meter next visit -referral for DME -foot care, diet, exercise, medications reviewed with the patient. -f/u in 4 weeks.  2. HTN -controlled -no change in regimen  3. Seasonal allergies with chronic cough due to a post-nasal drip -Zyrtec D -Saline nasal irrigations PRN -Ventolin HFA, Sample #1 1-2 puffs quid PRN

## 2011-06-03 NOTE — Telephone Encounter (Signed)
Wants to know if part vegan./raw foods meal plan would be okay (with Fish and chicken)? CDE, RD discussed trying to eat a balanced plate, with whole grains, lean protein foods and half a plate vegetables with fruit in moderation as desserts and snacks.

## 2011-06-10 ENCOUNTER — Other Ambulatory Visit: Payer: Self-pay | Admitting: Internal Medicine

## 2011-06-10 ENCOUNTER — Other Ambulatory Visit: Payer: Self-pay | Admitting: *Deleted

## 2011-06-10 DIAGNOSIS — E119 Type 2 diabetes mellitus without complications: Secondary | ICD-10-CM

## 2011-06-10 MED ORDER — INSULIN GLARGINE 100 UNIT/ML ~~LOC~~ SOLN: SUBCUTANEOUS | Status: DC

## 2011-06-10 MED ORDER — INSULIN PEN NEEDLE 32G X 6 MM MISC: 1.0000 | Freq: Every evening | Status: DC

## 2011-06-10 NOTE — Telephone Encounter (Signed)
Pt is requesting Lantus Solostar Pens not the vials; please enter new rx.  States she's taking 30 units at bedtime. Thanks   [employer].

## 2011-06-11 NOTE — Telephone Encounter (Signed)
Lantus Solostar refill done per Dr. Denton Meek.

## 2011-07-02 ENCOUNTER — Encounter: Payer: Self-pay | Admitting: Internal Medicine

## 2011-07-02 ENCOUNTER — Ambulatory Visit (INDEPENDENT_AMBULATORY_CARE_PROVIDER_SITE_OTHER): Payer: Self-pay | Admitting: Internal Medicine

## 2011-07-02 VITALS — BP 134/86 | HR 86 | Temp 98.7°F | Ht 67.0 in | Wt 176.7 lb

## 2011-07-02 DIAGNOSIS — Z23 Encounter for immunization: Secondary | ICD-10-CM

## 2011-07-02 DIAGNOSIS — I10 Essential (primary) hypertension: Secondary | ICD-10-CM

## 2011-07-02 DIAGNOSIS — E119 Type 2 diabetes mellitus without complications: Secondary | ICD-10-CM

## 2011-07-02 LAB — GLUCOSE, CAPILLARY: Glucose-Capillary: 202 mg/dL — ABNORMAL HIGH (ref 70–99)

## 2011-07-02 MED ORDER — INSULIN GLARGINE 100 UNIT/ML ~~LOC~~ SOLN: SUBCUTANEOUS | Status: DC

## 2011-07-02 NOTE — Patient Instructions (Signed)
Please, follow up in 3 months. You received a Pneumonia shot today. Next one will be due in 5 years. Have a great week!!!

## 2011-07-02 NOTE — Progress Notes (Signed)
Patient ID: Alyssa Haynes, female   DOB: 18-Jun-1945, 66 y.o.   MRN: [id number] HPI:    66 y/o pleasant woman undergoes a follow up evaluation after a recent Glargine dose increase. Patient reports CBG's in 150's; denies any hypoglycemic events, i.e palpations, sweats, dizziness or weakness. Patient denies any other concerns.  Review of Systems: Negative except per history of present illness  Physical Exam:  Nursing notes and vitals reviewed General:  alert, well-developed, and cooperative to examination.   Lungs:  normal respiratory effort, no accessory muscle use, normal breath sounds, no crackles, and no wheezes. Heart:  normal rate, regular rhythm, no murmurs, no gallop, and no rub.   Abdomen:  soft, non-tender, normal bowel sounds, no distention, no guarding, no rebound tenderness, no hepatomegaly, and no splenomegaly.   Extremities:  No cyanosis, clubbing, edema Neurologic:  alert & oriented X3, nonfocal exam  Meds: Current Outpatient Prescriptions on File Prior to Visit  Medication Sig Dispense Refill  . albuterol (PROVENTIL HFA) 108 (90 BASE) MCG/ACT inhaler Inhale 2 puffs into the lungs every 4 (four) hours as needed for shortness of breath.  3 each  4  . aspirin (ASPIR-LOW) 81 MG EC tablet Take 81 mg by mouth daily. With meal; for heart protection       . hydrochlorothiazide (HYDRODIURIL) 25 MG tablet Take 1 tablet (25 mg total) by mouth daily.  90 tablet  3  . Insulin Pen Needle 32G X 6 MM MISC 1 Device by Does not apply route Nightly.  60 each  11  . metFORMIN (GLUCOPHAGE) 1000 MG tablet Take 1 tablet (1,000 mg total) by mouth 2 (two) times daily with a meal.  90 tablet  3  . omeprazole (PRILOSEC) 40 MG capsule Take 1 capsule (40 mg total) by mouth daily.  90 capsule  3  . pravastatin (PRAVACHOL) 20 MG tablet Take 1 tablet (20 mg total) by mouth at bedtime.  90 tablet  4    Allergies: Other Past Medical History  Diagnosis Date  . Asthma   . Diabetes mellitus   . GERD  (gastroesophageal reflux disease)   . Hypertension   . Hyperlipidemia    Past Surgical History  Procedure Date  . Cholecystectomy 2006  . Tubal ligation 1988  . Cesarean section 1988  . Fracture surgery 1992    lEFT ANKLE orif   Family History  Problem Relation Age of Onset  . Cancer Mother 1    lUNG CANCER  . Heart disease Father 67   History   Social History  . Marital Status: Married    Spouse Name: N/A    Number of Children: N/A  . Years of Education: N/A   Occupational History  . Not on file.   Social History Main Topics  . Smoking status: Former Games developer  . Smokeless tobacco: Not on file   Comment: stopped in1975  . Alcohol Use: No  . Drug Use: No  . Sexually Active: Yes    Birth Control/ Protection: Surgical   Other Topics Concern  . Not on file   Social History Narrative  . No narrative on file    A/P: 1. DM, type 2, insulin dependent -tolerates 30 units of Galrgine qhs continue with metformin -referral for an annual eye exam -foot care, diet, exercise reviewed with the patient - Pneumovax today  2. HTN -controlled with current regimen -weight management discussed. -low salt diet, 30 min of  CV exercise daily  F/U in 3 months or  sooner if needed.

## 2011-09-17 ENCOUNTER — Emergency Department (HOSPITAL_COMMUNITY): Payer: Self-pay

## 2011-09-17 ENCOUNTER — Encounter (HOSPITAL_COMMUNITY): Payer: Self-pay | Admitting: *Deleted

## 2011-09-17 ENCOUNTER — Emergency Department (HOSPITAL_COMMUNITY)
Admission: EM | Admit: 2011-09-17 | Discharge: 2011-09-17 | Disposition: A | Discharge status: Home / Self Care | Payer: Self-pay | Attending: Emergency Medicine | Admitting: Emergency Medicine

## 2011-09-17 DIAGNOSIS — J45909 Unspecified asthma, uncomplicated: Secondary | ICD-10-CM | POA: Insufficient documentation

## 2011-09-17 DIAGNOSIS — E785 Hyperlipidemia, unspecified: Secondary | ICD-10-CM | POA: Insufficient documentation

## 2011-09-17 DIAGNOSIS — K219 Gastro-esophageal reflux disease without esophagitis: Secondary | ICD-10-CM | POA: Insufficient documentation

## 2011-09-17 DIAGNOSIS — Z794 Long term (current) use of insulin: Secondary | ICD-10-CM | POA: Insufficient documentation

## 2011-09-17 DIAGNOSIS — I10 Essential (primary) hypertension: Secondary | ICD-10-CM | POA: Insufficient documentation

## 2011-09-17 DIAGNOSIS — R51 Headache: Secondary | ICD-10-CM | POA: Insufficient documentation

## 2011-09-17 DIAGNOSIS — Z79899 Other long term (current) drug therapy: Secondary | ICD-10-CM | POA: Insufficient documentation

## 2011-09-17 DIAGNOSIS — Z9089 Acquired absence of other organs: Secondary | ICD-10-CM | POA: Insufficient documentation

## 2011-09-17 DIAGNOSIS — E119 Type 2 diabetes mellitus without complications: Secondary | ICD-10-CM | POA: Insufficient documentation

## 2011-09-17 MED ORDER — METOCLOPRAMIDE HCL 5 MG/ML IJ SOLN
10.0000 mg | Freq: Once | INTRAMUSCULAR | Status: DC
  Filled 2011-09-17: qty 2

## 2011-09-17 MED ORDER — METOCLOPRAMIDE HCL 5 MG/ML IJ SOLN
10.0000 mg | Freq: Once | INTRAMUSCULAR | Status: AC
  Administered 2011-09-17: 10 mg via INTRAMUSCULAR

## 2011-09-17 MED ORDER — KETOROLAC TROMETHAMINE 60 MG/2ML IM SOLN
60.0000 mg | Freq: Once | INTRAMUSCULAR | Status: AC
  Administered 2011-09-17: 60 mg via INTRAMUSCULAR
  Filled 2011-09-17: qty 2

## 2011-09-17 NOTE — ED Provider Notes (Signed)
History     CSN: [id number]  Arrival date & time 09/17/11  6578   First MD Initiated Contact with Patient 09/17/11 [phone]      Chief Complaint  Patient presents with  . sharp pain to right side of head   . Headache    (Consider location/radiation/quality/duration/timing/severity/associated sxs/prior treatment) Patient is a 66 y.o. female presenting with headaches. The history is provided by the patient.  Headache  This is a new problem. The current episode started yesterday. The problem occurs every few minutes. The problem has not changed since onset.The headache is associated with an unknown factor. The pain is located in the right unilateral region. The quality of the pain is described as sharp. The pain is at a severity of 10/10. The pain does not radiate. Pertinent negatives include no anorexia, no fever, no malaise/fatigue, no near-syncope, no palpitations, no shortness of breath, no nausea and no vomiting. She has tried nothing for the symptoms.    Past Medical History  Diagnosis Date  . Asthma   . Diabetes mellitus   . GERD (gastroesophageal reflux disease)   . Hypertension   . Hyperlipidemia     Past Surgical History  Procedure Date  . Cholecystectomy 2006  . Tubal ligation 1988  . Cesarean section 1988  . Fracture surgery 1992    lEFT ANKLE orif    Family History  Problem Relation Age of Onset  . Cancer Mother 49    lUNG CANCER  . Heart disease Father 37    History  Substance Use Topics  . Smoking status: Former Games developer  . Smokeless tobacco: Not on file   Comment: stopped in1975  . Alcohol Use: No    OB History    Grav Para Term Preterm Abortions TAB SAB Ect Mult Living                  Review of Systems  Constitutional: Negative for fever, chills, malaise/fatigue and fatigue.  HENT: Negative for hearing loss, ear pain, facial swelling, neck pain, neck stiffness and sinus pressure.   Eyes: Negative for pain and visual disturbance.  Respiratory:  Negative for shortness of breath.   Cardiovascular: Negative for chest pain, palpitations and near-syncope.  Gastrointestinal: Negative for nausea, vomiting and anorexia.  Musculoskeletal: Negative for myalgias and arthralgias.  Skin: Negative for rash.  Neurological: Positive for headaches. Negative for dizziness, facial asymmetry, speech difficulty, weakness, light-headedness and numbness.  Psychiatric/Behavioral: Negative for confusion.  All other systems reviewed and are negative.    Allergies  Other  Home Medications   Current Outpatient Rx  Name Route Sig Dispense Refill  . ALBUTEROL SULFATE HFA 108 (90 BASE) MCG/ACT IN AERS Inhalation Inhale 2 puffs into the lungs every 4 (four) hours as needed for shortness of breath. 3 each 4  . CETIRIZINE HCL 10 MG PO TABS Oral Take 10 mg by mouth daily.    Marland Kitchen HYDROCHLOROTHIAZIDE 25 MG PO TABS Oral Take 1 tablet (25 mg total) by mouth daily. 90 tablet 3  . INSULIN GLARGINE 100 UNIT/ML [place] SOLN  Inject  30 units SQ at bedtime. 10 mL 12  . INSULIN PEN NEEDLE 32G X 6 MM MISC Does not apply 1 Device by Does not apply route Nightly. 60 each 11  . METFORMIN HCL 1000 MG PO TABS Oral Take 1 tablet (1,000 mg total) by mouth 2 (two) times daily with a meal. 90 tablet 3  . OMEPRAZOLE 40 MG PO CPDR Oral Take 1 capsule (40  mg total) by mouth daily. 90 capsule 3  . PRAVASTATIN SODIUM 20 MG PO TABS Oral Take 1 tablet (20 mg total) by mouth at bedtime. 90 tablet 4    BP 169/86  Pulse 95  Temp 97.7 F (36.5 C) (Oral)  Resp 18  SpO2 100%  Physical Exam  Nursing note and vitals reviewed. Constitutional: She is oriented to person, place, and time. She appears well-developed and well-nourished.  HENT:  Head: Normocephalic and atraumatic.    Right Ear: Tympanic membrane normal. No mastoid tenderness.  Left Ear: Tympanic membrane normal. No mastoid tenderness.  Nose: Nose normal.  Mouth/Throat: Oropharynx is clear and moist.       Significant cerum in  bilateral ears. No temporal artery tenderness.  Eyes: Conjunctivae and EOM are normal. Pupils are equal, round, and reactive to light.  Neck: Full passive range of motion without pain. Neck supple. No spinous process tenderness and no muscular tenderness present.  Cardiovascular: Normal rate, regular rhythm, normal heart sounds and intact distal pulses.   Pulmonary/Chest: Effort normal and breath sounds normal. No respiratory distress.  Abdominal: Soft. She exhibits no distension. There is no tenderness.  Lymphadenopathy:       Head (right side): No submental, no submandibular, no tonsillar, no preauricular, no posterior auricular and no occipital adenopathy present.       Head (left side): No submental, no submandibular, no tonsillar, no preauricular, no posterior auricular and no occipital adenopathy present.    She has no cervical adenopathy.  Neurological: She is alert and oriented to person, place, and time. She has normal strength. No cranial nerve deficit or sensory deficit. Gait normal. GCS eye subscore is 4. GCS verbal subscore is 5. GCS motor subscore is 6.  Reflex Scores:      Bicep reflexes are 2+ on the right side and 2+ on the left side.      Patellar reflexes are 2+ on the right side and 2+ on the left side. Skin: Skin is warm and dry.  Psychiatric: She has a normal mood and affect.    ED Course  Procedures (including critical care time)  Labs Reviewed - No data to display Ct Head Wo Contrast  09/17/2011  *RADIOLOGY REPORT*  Clinical Data: Intermittent sharp right sided headache.  CT HEAD WITHOUT CONTRAST  Technique:  Contiguous axial images were obtained from the base of the skull through the vertex without contrast.  Comparison: None.  Findings: Normal appearing cerebral hemispheres and posterior fossa structures.  Normal size and position of the ventricles.  No intracranial hemorrhage, mass lesion or CT evidence of acute infarction.  Single ethmoid air cell opacified  bilaterally.  IMPRESSION:  1.  No intracranial abnormality. 2.  Mild chronic bilateral ethmoid sinusitis.  Original Report Authenticated By: Darrol Angel, M.D.     1. Headache       MDM  66 year old female presents with head pain since yesterday afternoon. She describes in each sensation in a single spot on the right side of her head, but is here out of 10 in intensity, however about every 4-5 minutes has a severe sharp stabbing sensation localized to just in that area, and is nonradiating. She has not recently had any infectious symptoms, has no neck pain or stiffness, no neurologic deficits, no nausea or vomiting, no changes in her vision. She has no history of headaches, no trauma, has not had previous symptoms before or similar pain in any other areas, and has taken nothing to try  and alleviate her symptoms. The patient is concerned that her headache might be a sign of a stroke. Her exam is unremarkable. Unsure of etiology of headache, but is not consistent with stroke, intracranial hemorrhage, typical migraine, temporal arteritis, meningitis or encephalitis. Will treat with Toradol to see if it will decrease the frequency and intensity of her discomfort.   The patient with no changes after Toradol. We'll get a CT of her head to evaluate for possible underlying bony lesions or masses that may be contributing to her intermittent is comfort, and will attempt treatment with Reglan.   CT unremarkable. The patient has similar intensity of her sharp pain, but says they are coming much less frequently. Discussed with the patient treatment at home, close followup with her regular doctor, and indications for return. The patient and her express understanding with this plan.    Theotis Burrow, MD 09/17/11 1736

## 2011-09-17 NOTE — ED Notes (Addendum)
Pt reports intermittent sharp pain to right head since last night with no associated symptoms. Reports comes every 4-5 minutes and lasts for just a second.

## 2011-09-18 ENCOUNTER — Encounter: Payer: Self-pay | Admitting: Internal Medicine

## 2011-09-18 ENCOUNTER — Ambulatory Visit (INDEPENDENT_AMBULATORY_CARE_PROVIDER_SITE_OTHER): Payer: Self-pay | Admitting: Internal Medicine

## 2011-09-18 VITALS — BP 136/91 | HR 84 | Temp 97.6°F | Ht 67.0 in | Wt 178.9 lb

## 2011-09-18 DIAGNOSIS — Z79899 Other long term (current) drug therapy: Secondary | ICD-10-CM

## 2011-09-18 DIAGNOSIS — R519 Headache, unspecified: Secondary | ICD-10-CM | POA: Insufficient documentation

## 2011-09-18 DIAGNOSIS — I10 Essential (primary) hypertension: Secondary | ICD-10-CM

## 2011-09-18 DIAGNOSIS — R51 Headache: Secondary | ICD-10-CM

## 2011-09-18 DIAGNOSIS — E119 Type 2 diabetes mellitus without complications: Secondary | ICD-10-CM

## 2011-09-18 LAB — GLUCOSE, CAPILLARY: Glucose-Capillary: 173 mg/dL — ABNORMAL HIGH (ref 70–99)

## 2011-09-18 LAB — SEDIMENTATION RATE: Sed Rate: 34 mm/hr — ABNORMAL HIGH (ref 0–22)

## 2011-09-18 LAB — POCT GLYCOSYLATED HEMOGLOBIN (HGB A1C): Hemoglobin A1C: 10

## 2011-09-18 MED ORDER — OXYCODONE-ACETAMINOPHEN 5-325 MG PO TABS: 1.0000 | ORAL_TABLET | Freq: Four times a day (QID) | ORAL | Status: DC | PRN

## 2011-09-18 MED ORDER — INSULIN GLARGINE 100 UNIT/ML ~~LOC~~ SOLN: SUBCUTANEOUS | Status: DC

## 2011-09-18 NOTE — Assessment & Plan Note (Signed)
Blood pressure is 136/91. The slight elevation of blood pressure is likely due to pain. We'll continue current regimen.

## 2011-09-18 NOTE — Assessment & Plan Note (Signed)
Patient's diabetes is not well controlled. Her A1c was 10.0 today. Patient reports that she measures his CBG every morning which is above  200 at most of the times. We'll increase her Lantus to 35 units.

## 2011-09-18 NOTE — Assessment & Plan Note (Signed)
Patient's headaches are most likely caused by occipital neuralgia. The other differential diagnoses include, but less likely temporal arteritis (there is no physical findings indicating temporal arteritis) and trigeminal neuropathy (location is not consistent with this diagnosis).  -Will treat symptomatically with Percocet -Will check ESR. Although physical findings are not consistent with temporal arteritis, it is important to rule out this possibility. -Will give patient a referral to pain clinic for possible local injection treatment.

## 2011-09-18 NOTE — ED Provider Notes (Signed)
I saw and evaluated the patient, reviewed the resident's note and I agree with the findings and plan. Headache. Sharp right sided and occasional. patietn feels somewhat better after treatment. Negative CT  Alyssa Haynes. Rubin Payor, MD 09/18/11 [phone]

## 2011-09-18 NOTE — Patient Instructions (Signed)
1. Please increase your Lantus dosage to 35 units daily.  2. Please take all medications as prescribed.  3. If you have worsening of your symptoms or new symptoms arise, please call the clinic ([phone]), or go to the ER immediately if symptoms are severe.    Occipital Neuralgia Neuralgias are attacks of sharp stabbing pain. They may be intermittent (comes and goes) or constant in nature. They may be brief attacks that last seconds to minutes and may come back for days to weeks. The neuralgias can occur as a result of a herpes zoster (shingles), chickenpox infection, or even following a herpes simplex infection (cold sore). TYPES OF NEURALGIA  When these pains are located in the back of the head and neck they are called occipital neuralgias.   When the pain is located between ribs it is called intercostal neuralgia.   When the pain is located in the face it is called trigeminal neuralgia. This is the most common neuralgia. It causes sharp, shock like pain on one side of your face.  The neuralgias, which follow herpes zoster infections, often produce a constant burning pain. They may last from weeks to months and even years. The attacks of pain may come from injury or inflammation (irritation) to a nerve. Often the cause is unknown. The episodes of pain may be caused by light touch, movement, or even eating and sneezing. Usually these neuralgias occur after age 43. The neuralgias following shingles and trigeminal neuralgia are the most common. Although painful, these episodes do not threaten life and tend to lessen as we grow older. TREATMENT  There are many medications that may be helpful in the treatment of this disorder. Sometimes several medications may have to be tried before the right combination can be found for you. Some of these medications are:  Only take over-the-counter or prescription medications for pain, discomfort, or fever as directed by your caregiver.   Narcotic medications  may be used to control the pain.   Antidepressants and medications used in epilepsy (seizure disorders) may be useful.  LET YOUR CAREGIVER KNOW ABOUT:  If you do not obtain relief from medications.   Problems that are getting worse rather than better.   Troubling side effects that you think are coming from the medication.  Do not be discouraged if you do not obtain instant relief from the medications or help given you. Your caregiver can help you get through these episodes of pain with some persistence (continued trying) on your part also. Document Released: 01/29/2001 Document Revised: 01/24/2011 Document Reviewed: 02/04/2005 [facility] Patient Information 2012 [place], [place].

## 2011-09-18 NOTE — Progress Notes (Signed)
Patient ID: Alyssa Haynes, female   DOB: Aug 17, 1945, 66 y.o.   MRN: [id number]  Subjective:   Patient ID: Alyssa Haynes female   DOB: [birth year] 66 y.o.   MRN: [id number]  HPI: Alyssa Haynes is a 66 y.o. with past medical history as outlined below, who presents for a followup visit following her recent visit to ED.   Patient visited ED at 09/17/11 because of headache for 2 days. She reports having intermittent and severe pain in a single spot on the right side of her head for 2 days. The pain is sharp, stabbing, shocking and non-radiating. It happens every 5-10 minutes, sometimes she doesn't have pain for several hours. It lasted for few seconds when happens each time. The pain is aggravated by sleeping on the right side. She has not recently had any viral infection, no neck pain or stiffness, no neurologic deficits, no nausea or vomiting, no changes in her vision and hearing, no recent trauma. Patient had a negative CT head in ED and was treated with Toradol injection in ED. Today patient still has similar pain in the same location.  Denies fever, chills, fatigue, cough, chest pain, SOB,  abdominal pain,diarrhea, constipation, dysuria, urgency, frequency, hematuria.  Past Medical History  Diagnosis Date  . Asthma   . Diabetes mellitus   . GERD (gastroesophageal reflux disease)   . Hypertension   . Hyperlipidemia    Current Outpatient Prescriptions  Medication Sig Dispense Refill  . albuterol (PROVENTIL HFA) 108 (90 BASE) MCG/ACT inhaler Inhale 2 puffs into the lungs every 4 (four) hours as needed for shortness of breath.  3 each  4  . cetirizine (ZYRTEC) 10 MG tablet Take 10 mg by mouth daily.      . hydrochlorothiazide (HYDRODIURIL) 25 MG tablet Take 1 tablet (25 mg total) by mouth daily.  90 tablet  3  . insulin glargine (LANTUS SOLOSTAR) 100 UNIT/ML injection Inject  30 units SQ at bedtime.  10 mL  12  . Insulin Pen Needle 32G X 6 MM MISC 1 Device by Does not apply route  Nightly.  60 each  11  . metFORMIN (GLUCOPHAGE) 1000 MG tablet Take 1 tablet (1,000 mg total) by mouth 2 (two) times daily with a meal.  90 tablet  3  . omeprazole (PRILOSEC) 40 MG capsule Take 1 capsule (40 mg total) by mouth daily.  90 capsule  3  . pravastatin (PRAVACHOL) 20 MG tablet Take 1 tablet (20 mg total) by mouth at bedtime.  90 tablet  4   Family History  Problem Relation Age of Onset  . Cancer Mother 104    lUNG CANCER  . Heart disease Father 37   History   Social History  . Marital Status: Married    Spouse Name: N/A    Number of Children: N/A  . Years of Education: N/A   Social History Main Topics  . Smoking status: Former Games developer  . Smokeless tobacco: None   Comment: stopped in1975  . Alcohol Use: No  . Drug Use: No  . Sexually Active: Yes    Birth Control/ Protection: Surgical   Other Topics Concern  . None   Social History Narrative  . None    Review of Systems  Constitutional: Negative for fever, chills, malaise/fatigue and fatigue.  HENT: Negative for hearing loss, ear pain, facial swelling, neck pain, neck stiffness and sinus pressure.   Eyes: Negative for pain and visual disturbance.  Respiratory: Negative for shortness of  breath.   Cardiovascular: Negative for chest pain, palpitations and near-syncope.  Gastrointestinal: Negative for nausea, vomiting and anorexia.  Musculoskeletal: Negative for myalgias and arthralgias.  Skin: Negative for rash.  Neurological: Positive for headaches. Negative for dizziness, facial asymmetry, speech difficulty, weakness, light-headedness and numbness.  Psychiatric/Behavioral: Negative for confusion.  All other systems reviewed and are negative.   Objective:  Physical Exam: Filed Vitals:   09/18/11 1114  BP: 136/91  Pulse: 84  Temp: 97.6 F (36.4 C)  TempSrc: Oral  Height: 5\' 7"  (1.702 m)  Weight: 178 lb 14.4 oz (81.149 kg)   General: resting in bed, not in acute distress HEENT: PERRL, EOMI, no scleral  icterus. There is an increased sensitivity over her right temporal area skin. There is no temporal artery tenderness.  Cardiac: S1/S2, RRR, No murmurs, gallops or rubs Pulm: Good air movement bilaterally, Clear to auscultation bilaterally, No rales, wheezing, rhonchi or rubs. Abd: Soft,  nondistended, nontender, no rebound pain, no organomegaly, BS present Ext: No rashes or edema, 2+DP/PT pulse bilaterally Musculoskeletal: No joint deformities, erythema, or stiffness, ROM full and no nontender Skin: no rashes. No skin bruise. Neuro: alert and oriented X3, cranial nerves II-XII grossly intact, muscle strength 5/5 in all extremeties,  sensation to light touch intact. Normal finger to nose test. Psych.: patient is not psychotic, no suicidal or hemocidal ideation.       Assessment & Plan:

## 2011-09-26 ENCOUNTER — Other Ambulatory Visit: Payer: Self-pay | Admitting: Internal Medicine

## 2011-09-26 ENCOUNTER — Telehealth: Payer: Self-pay | Admitting: *Deleted

## 2011-09-26 DIAGNOSIS — E119 Type 2 diabetes mellitus without complications: Secondary | ICD-10-CM

## 2011-09-26 NOTE — Telephone Encounter (Signed)
Pt states she will not be able to go to the Pain clinic d/t cost; so I will close the referral. But she is requesting Diabetic referral w/Donna Plyler - if this is ok, can you order the referral ?  Thanks

## 2011-09-26 NOTE — Addendum Note (Signed)
Addended by: Hassan Buckler on: 09/26/2011 03:54 PM   Modules accepted: Orders

## 2011-09-30 NOTE — Telephone Encounter (Signed)
Received referral from Dr. Clyde Lundborg; pt has an appt w/Donna Plyler 10/01/11.

## 2011-10-01 ENCOUNTER — Ambulatory Visit (INDEPENDENT_AMBULATORY_CARE_PROVIDER_SITE_OTHER): Payer: Self-pay | Admitting: Internal Medicine

## 2011-10-01 ENCOUNTER — Ambulatory Visit (INDEPENDENT_AMBULATORY_CARE_PROVIDER_SITE_OTHER): Payer: Self-pay | Admitting: Dietician

## 2011-10-01 ENCOUNTER — Encounter: Payer: Self-pay | Admitting: Internal Medicine

## 2011-10-01 VITALS — BP 137/86 | HR 83 | Temp 98.3°F | Ht 67.9 in | Wt 177.9 lb

## 2011-10-01 DIAGNOSIS — Z1211 Encounter for screening for malignant neoplasm of colon: Secondary | ICD-10-CM

## 2011-10-01 DIAGNOSIS — E119 Type 2 diabetes mellitus without complications: Secondary | ICD-10-CM

## 2011-10-01 DIAGNOSIS — J45909 Unspecified asthma, uncomplicated: Secondary | ICD-10-CM

## 2011-10-01 DIAGNOSIS — R51 Headache: Secondary | ICD-10-CM

## 2011-10-01 DIAGNOSIS — I10 Essential (primary) hypertension: Secondary | ICD-10-CM

## 2011-10-01 DIAGNOSIS — E785 Hyperlipidemia, unspecified: Secondary | ICD-10-CM

## 2011-10-01 DIAGNOSIS — R519 Headache, unspecified: Secondary | ICD-10-CM

## 2011-10-01 LAB — GLUCOSE, CAPILLARY: Glucose-Capillary: 286 mg/dL — ABNORMAL HIGH (ref 70–99)

## 2011-10-01 MED ORDER — INSULIN PEN NEEDLE 32G X 6 MM MISC: 1.0000 | Freq: Every evening | Status: DC

## 2011-10-01 MED ORDER — CETIRIZINE HCL 10 MG PO TABS: 10.0000 mg | ORAL_TABLET | Freq: Every day | ORAL | Status: DC

## 2011-10-01 MED ORDER — ALBUTEROL SULFATE HFA 108 (90 BASE) MCG/ACT IN AERS: 2.0000 | INHALATION_SPRAY | RESPIRATORY_TRACT | Status: DC | PRN

## 2011-10-01 MED ORDER — PRAVASTATIN SODIUM 20 MG PO TABS: 20.0000 mg | ORAL_TABLET | Freq: Every day | ORAL | Status: DC

## 2011-10-01 MED ORDER — HYDROCHLOROTHIAZIDE 25 MG PO TABS: 25.0000 mg | ORAL_TABLET | Freq: Every day | ORAL | Status: DC

## 2011-10-01 MED ORDER — OMEPRAZOLE 40 MG PO CPDR: 40.0000 mg | DELAYED_RELEASE_CAPSULE | Freq: Every day | ORAL | Status: DC

## 2011-10-01 MED ORDER — METFORMIN HCL 1000 MG PO TABS: 1000.0000 mg | ORAL_TABLET | Freq: Two times a day (BID) | ORAL | Status: DC

## 2011-10-01 NOTE — Patient Instructions (Addendum)
1. Continue taking 35 U of lantus at night. Check your blood sugars every morning.  2. Please follow-up with Ms. Plyer to talk about how to manage your diet and exercise. It would be helpful if you could have several brief visits with her over the next 3 months to check in. 3. Please return to the clinic in 3 months for follow-up and bring your glucose meter. 4. Please use the stool cards and mail them back as instructed to screen for colon cancer.

## 2011-10-01 NOTE — Assessment & Plan Note (Signed)
Lab Results  Component Value Date   NA 137 02/07/2011   K 3.8 02/07/2011   CL 101 02/07/2011   CO2 26 02/07/2011   BUN 15 02/07/2011   CREATININE 0.82 02/07/2011   CREATININE 0.80 09/16/2008    BP Readings from Last 3 Encounters:  10/01/11 137/86  09/18/11 136/91  09/17/11 143/88    Assessment: Hypertension control:  mildly elevated  Progress toward goals:  improved Barriers to meeting goals:  no barriers identified  Plan: Hypertension treatment:  continue current medications Pt w no evidence of renal disease at this time and BP well-controlled. If requires additional antihypertensive or if renal dz develops, will add ACE-i as second drug therapy.

## 2011-10-01 NOTE — Assessment & Plan Note (Addendum)
Occipital neuralgia is improved, with minimal allodynia. No warning sx or loss of vision. No longer requiring pain meds at this time. - Resolved

## 2011-10-01 NOTE — Progress Notes (Signed)
INTERNAL MEDICINE TEACHING ATTENDING ADDENDUM - Rocco Serene, MD: I personally saw and evaluated Alyssa Haynes in this clinic visit in conjunction with the resident, Dr. Heloise Beecham. I have discussed the patient's plan of care with Dr. Heloise Beecham during this visit. I have confirmed the physical exam findings and have read and agree with the clinic note including the plan.

## 2011-10-01 NOTE — Patient Instructions (Addendum)
Basic Carbohydrate Counting Basic carbohydrate counting is a way to plan meals. It is done by counting the amount of carbohydrate in foods. Eating carbohydrates increases blood glucose (sugar) levels. People with diabetes use carbohydrate counting to help keep their blood glucose at a normal level.  Foods that have carbohydrates are starches (grains, beans, starchy vegetables) and sweets.  COUNTING CARBOHYDRATES IN FOODS The first step in counting carbohydrates is to learn how many carbohydrate servings you should have in every meal. A dietitian can plan this for you. After learning the amount of carbohydrates to include in your meal plan, you can start to choose the carbohydrate-containing foods you want to eat.  There are 2 ways to identify the amount of carbohydrates in the foods you eat.  Read the Nutrition Facts panel on food labels. All you need are 2 pieces of information from the Nutrition Facts panel to count carbohydrates this way:   Serving size.   Total carbohydrate (in grams).  Decide how many servings you will be eating. If it is 1 serving, you will be eating the amount of carbohydrate listed on the panel. If you will be eating 2 servings, you will be eating double the amount of carbohydrate listed on the panel.   Learn serving sizes. A serving size of most carbohydrate-containing foods is about 15 g. Listed below are serving sizes of common carbohydrate-containing foods:   1 slice bread.    cup unsweetened, dry cereal.    cup hot cereal.   ? cup rice.    cup mashed potatoes.   ? cup pasta.   1 cup fresh fruit.    cup canned fruit.   1 cup milk (whole, 2%, or skim).    cup starchy vegetables (peas, corn, or potatoes).  Counting carbohydrates this way is similar to looking on the Nutrition Facts panel. Decide how many servings you will eat first. Multiply the number of servings you eat by 15 g. For example, if you have 2 cups of strawberries, you had 2 servings.  That means you had 30 g of carbohydrate (2 servings x 15 g = 30 g). CALCULATING CARBOHYDRATES IN A MEAL Sample dinner  3 oz chicken breast.   ? cup brown rice.    cup corn.   1 cup fat-free milk.   1 cup strawberries with sugar-free whipped topping.  Carbohydrate calculation First, identify the foods that contain carbohydrate:  Rice.   Corn.   Milk.   Strawberries.  Calculate the number of servings eaten:  2 servings rice.   1 serving corn.   1 serving milk.   1 serving strawberries.  Multiply the number of servings by 15 g:  2 servings rice x 15 g = 30 g.   1 serving corn x 15 g = 15 g.   1 serving milk x 15 g = 15 g.   1 serving strawberries x 15 g = 15 g.  Add the amounts to find the total carbohydrates eaten: 30 g + 15 g + 15 g + 15 g = 75 g carbohydrate eaten at dinner. Document Released: 02/04/2005 Document Revised: 01/24/2011 Document Reviewed: 12/21/2010 [facility] Patient Information 2012 [place], [place].  Please find an APP to help you keep track of your carbs and email a food record at least one time in the next two weeks.   Measure all foods for at least the one day each month. "Measure on Mondays"  Your goal for carbs are 60 grams for meal and 15  grams for snacks 3 x a day.  Follow up in 1 month (mid September) and mid october (2 months).

## 2011-10-01 NOTE — Assessment & Plan Note (Signed)
Lab Results  Component Value Date   HGBA1C 10.0 09/18/2011   HGBA1C 9.4 05/09/2009   CREATININE 0.82 02/07/2011   CREATININE 0.80 09/16/2008   MICROALBUR 0.80 01/25/2011   MICRALBCREAT 6.8 01/25/2011   CHOL 171 01/25/2011   HDL 44 01/25/2011   TRIG 123 01/25/2011    Last eye exam and foot exam:    Component Value Date/Time   HMDIABEYEEXA normal 05/28/2008   HMDIABFOOTEX done 06/12/2009    Assessment: Diabetes control: not controlled Progress toward goals: improved Barriers to meeting goals: no barriers identified  Plan: Diabetes treatment: continue current medications Refer to: diabetes educator for self-management training and diabetes educator for medical nutrition therapy Instruction/counseling given: reminded to get eye exam, reminded to bring blood glucose meter & log to each visit, discussed foot care, discussed the need for weight loss, discussed diet and other instruction/counseling: Patient reports that she wants to make aggressive lifestyle modifications before insulin therapy is increased or additional drugs are added to regimen. She will meet with Norm Parcel today. She was advised that several, frequent visits with Ms. Plyler will be needed to maintain these lifestyle modification efforts. We will recheck HbA1c at next visit and evaluate need for additional increases in insulin therapy at that time. Pt also notes that she has been unable to see ophtho bc she does not qualify for orange card and she is uninsured. Has met with Rudell Cobb a couple of times to discuss financial assistance. Said she will continue to save money for copay and will try to make appointment in the next couple of months.

## 2011-10-01 NOTE — Assessment & Plan Note (Addendum)
Pt cannot afford colonoscopy at this time. Alternate method of FOBT discussed, including need for colonoscopy if card returns positive. Pt reports understanding, and said she would like to proceed. - Sent home w FOBT cards.  ADDENDUM 01/06/2012 2:12 PM  FOBT cards returned, negative x3, I called pt to communicate the results Bronson Curb

## 2011-10-01 NOTE — Progress Notes (Signed)
Subjective:   Patient ID: Alyssa Haynes female   DOB: [birth year] 66 y.o.   MRN: [id number]  HPI: [name] is a 66 y.o. female with past medical history of type 2 diabetes, hypertension, hyperlipidemia and GERD presenting for followup of diabetes. Patient reports that she has recently become concerned about proper control of her diabetes. She recently saw Dr.Nu in clinic at the end July for followup of ED visit for headache. At this time she hadn't HbA1c drawn which was 10.0. She was concerned about this value reported that she previously not been taking her Lantus or metformin as prescribed. Since her last visit, she has been taking Lantus 35 units every night and is taking metformin 1000 twice a day. She checks her blood sugar every morning, and it usually runs between 150 and 250. She also says she has been trying to modify her diet and has an appointment with Norm Parcel today. She is walking 2 miles a day and is having to increase his distance. She says she is extremely motivated to get her diabetes under control, and would prefer to try lifestyle modifications first before adding mealtime insulin or other drugs were diabetes regimen. She denies any changes in vision or tingling and numbness the distal extremities. She says she's been feeling well lately and is encouraged by weight loss. She is concerned that the diet she is trying to follow her diabetes may not be the best method for diet-controlled. She denies any symptoms of hypoglycemia such as palpitations, sweating, dizziness, or nausea. Of note, she is trying to get an appointment for her annual diabetic eye exam. She was due for ophthalmologic evaluation in May, but does not have insurance and could not been to co-pay at that time. She met with Rudell Cobb to discuss options for the orange card, but she did not qualify for she and her husband and 2 properties. She reports she is still trying to get into the clinic. In  addition she reports interval improvement of right occipital headache. The symptoms of allodynia she was reporting previously have nearly resolved. She only took the oxycodone prescribed for 3 or 4 days because she did not like the sedating effects of the medication. She says she feels like her headaches have improved greatly and she did not require any analgesics at this time. She denies any visual changes or temporary loss of vision. She is also due for colon cancer screening. She did not get colonoscopy he is referred in May because of lack of insurance as outlined above. She is interested in alternative screening today.   Past Medical History  Diagnosis Date  . Asthma   . Diabetes mellitus   . GERD (gastroesophageal reflux disease)   . Hypertension   . Hyperlipidemia    Current Outpatient Prescriptions  Medication Sig Dispense Refill  . albuterol (PROVENTIL HFA) 108 (90 BASE) MCG/ACT inhaler Inhale 2 puffs into the lungs every 4 (four) hours as needed for shortness of breath.  3 each  4  . cetirizine (ZYRTEC) 10 MG tablet Take 1 tablet (10 mg total) by mouth daily.  30 tablet  4  . hydrochlorothiazide (HYDRODIURIL) 25 MG tablet Take 1 tablet (25 mg total) by mouth daily.  90 tablet  3  . insulin glargine (LANTUS SOLOSTAR) 100 UNIT/ML injection Inject  35 units SQ at bedtime.  10 mL  12  . Insulin Pen Needle 32G X 6 MM MISC 1 Device by Does not apply route Nightly.  60 each  11  . metFORMIN (GLUCOPHAGE) 1000 MG tablet Take 1 tablet (1,000 mg total) by mouth 2 (two) times daily with a meal.  90 tablet  3  . omeprazole (PRILOSEC) 40 MG capsule Take 1 capsule (40 mg total) by mouth daily.  90 capsule  3  . oxyCODONE-acetaminophen (ROXICET) 5-325 MG per tablet Take 1 tablet by mouth every 6 (six) hours as needed for pain.  30 tablet  0  . pravastatin (PRAVACHOL) 20 MG tablet Take 1 tablet (20 mg total) by mouth at bedtime.  90 tablet  4  . DISCONTD: albuterol (PROVENTIL HFA) 108 (90 BASE)  MCG/ACT inhaler Inhale 2 puffs into the lungs every 4 (four) hours as needed for shortness of breath.  3 each  4  . DISCONTD: cetirizine (ZYRTEC) 10 MG tablet Take 10 mg by mouth daily.      Marland Kitchen DISCONTD: hydrochlorothiazide (HYDRODIURIL) 25 MG tablet Take 1 tablet (25 mg total) by mouth daily.  90 tablet  3  . DISCONTD: metFORMIN (GLUCOPHAGE) 1000 MG tablet Take 1 tablet (1,000 mg total) by mouth 2 (two) times daily with a meal.  90 tablet  3  . DISCONTD: omeprazole (PRILOSEC) 40 MG capsule Take 1 capsule (40 mg total) by mouth daily.  90 capsule  3  . DISCONTD: pravastatin (PRAVACHOL) 20 MG tablet Take 1 tablet (20 mg total) by mouth at bedtime.  90 tablet  4   Family History  Problem Relation Age of Onset  . Cancer Mother 30    lUNG CANCER  . Heart disease Father 60   History   Social History  . Marital Status: Married    Spouse Name: N/A    Number of Children: N/A  . Years of Education: N/A   Social History Main Topics  . Smoking status: Former Games developer  . Smokeless tobacco: None   Comment: stopped in1975  . Alcohol Use: No  . Drug Use: No  . Sexually Active: Yes    Birth Control/ Protection: Surgical   Other Topics Concern  . None   Social History Narrative  . None   Review of Systems: Constitutional: Denies fever, chills, diaphoresis, appetite change and fatigue.  HEENT: Denies photophobia, eye pain, redness,congestion, sore throat, rhinorrhea, neck pain, neck stiffness and tinnitus.  Respiratory: Denies SOB, DOE, cough, chest tightness,  and wheezing.   Cardiovascular: Denies chest pain, palpitations and leg swelling.  Gastrointestinal: Denies nausea, vomiting, abdominal pain, diarrhea, constipation, blood in stool and abdominal distention.  Genitourinary: Denies dysuria, urgency, frequency, hematuria, flank pain and difficulty urinating.  Musculoskeletal: Denies myalgias, back pain, joint swelling, arthralgias. Skin: Denies pallor, rash and wound.  Neurological: Denies  dizziness, weakness, light-headedness, numbness and headaches.    Objective:  Physical Exam: Filed Vitals:   10/01/11 1331  BP: 137/86  Pulse: 83  Temp: 98.3 F (36.8 C)  TempSrc: Oral  Height: 5' 7.9" (1.725 m)  Weight: 177 lb 14.4 oz (80.695 kg)  SpO2: 97%   Constitutional: Vital signs reviewed.  Patient is a well-developed and well-nourished female in no acute distress and cooperative with exam. Alert and oriented x3.  Head: Normocephalic and atraumatic Mouth: no erythema or exudates, MMM Eyes: PERRL, EOMI, conjunctivae normal, No scleral icterus.  Neck: Supple, Trachea midline normal ROM, No JVD, mass, thyromegaly, or carotid bruit present.  Cardiovascular: RRR, S1 normal, S2 normal, no MRG, pulses symmetric and intact bilaterally Pulmonary/Chest: CTAB, no wheezes, rales, or rhonchi Abdominal: Soft. Non-tender, non-distended, bowel sounds are normal,  no masses, organomegaly, or guarding present.  GU: no CVA tenderness Musculoskeletal: No joint deformities, erythema, or stiffness, ROM full and no nontender Hematology: no cervical, supraclavicular or submandibular lymphadenopathy  Neurological: A&O x3, Strength is normal and symmetric bilaterally, cranial nerve II-XII are grossly intact, no focal motor deficit, sensory intact to light touch bilaterally.  Skin: Warm, dry and intact. No rash, cyanosis, or clubbing.  Psychiatric: Normal mood and affect. speech and behavior is normal. Judgment and thought content normal. Cognition and memory are normal.   Assessment & Plan:

## 2011-10-02 ENCOUNTER — Encounter: Payer: Self-pay | Admitting: Dietician

## 2011-10-02 NOTE — Progress Notes (Signed)
Follow-up visit:  MNT2 1st visit 02/2011 Medical Nutrition Therapy:  Appt start time: 1430 end time:  1330.  Assessment:  Primary concerns today: Blood sugar control and Meal planning.  Labs and MEDICATIONS noted,  Weight stable, a1c decreased 0.7%. Blood sugars fasting high 100s and 200s . Patient unhappy with blood sugars and mentions the cost of lantus with out insurance coverage is a burden. 24 hour recall shows large portions of healthy carbs throughout day and food choices with added fats 88 grams of carb for meals and 30+ for snacks. Recommendation is 60 grams for meals and 15 grams for snacks. Recent physical activity includes she is now walking 5 days a week several miles which takes ~ 45 minutes/day.  Progress Towards Goal(s):  In progress.   Nutritional Diagnosis:  NB-1.1 Food and nutrition-related knowledge deficit As related to NB-1.1 Food and nutrition-related knowledge deficit As related to lack of prior exposure to carb counting  .  As evidenced by  by her report and food recall. ..    Intervention:  1- Nutrition education about portions sizes of fruits and starches 2- Nutrition education about how to measure/weigh and  track food intake to assist her with learning portion sizes.  3- Recommended she use online carb tracker and email CDE results prior to next visit. 4- coordination of care- discussed less expensive options for insulin and will mail her informaton on [place] med assist and insulin and carb counting free classes.   Monitoring/Evaluation:  Dietary intake by email prior to next visit, exercise, blood sugars, and body weight in 3-6 weeks

## 2011-10-17 ENCOUNTER — Ambulatory Visit: Payer: Self-pay

## 2011-10-28 ENCOUNTER — Telehealth: Payer: Self-pay | Admitting: *Deleted

## 2011-10-28 DIAGNOSIS — J40 Bronchitis, not specified as acute or chronic: Secondary | ICD-10-CM

## 2011-10-28 NOTE — Telephone Encounter (Signed)
Pt calls and states she has sinus infection and now in her chest, has a very loud congested cough, productive- yellow, "waxy" color mucous. No appts til thurs, she is ask to go to urg care and is agreeable

## 2011-10-29 NOTE — Telephone Encounter (Signed)
Agree, thank you

## 2011-11-04 ENCOUNTER — Encounter: Payer: Self-pay | Admitting: Dietician

## 2011-11-13 ENCOUNTER — Telehealth: Payer: Self-pay | Admitting: Dietician

## 2011-11-13 ENCOUNTER — Other Ambulatory Visit: Payer: Self-pay | Admitting: Internal Medicine

## 2011-11-13 ENCOUNTER — Other Ambulatory Visit: Payer: Self-pay | Admitting: *Deleted

## 2011-11-13 DIAGNOSIS — E119 Type 2 diabetes mellitus without complications: Secondary | ICD-10-CM

## 2011-11-13 MED ORDER — "INSULIN SYRINGE 31G X 5/16"" 0.5 ML MISC": Status: DC

## 2011-11-13 MED ORDER — INSULIN GLARGINE 100 UNIT/ML ~~LOC~~ SOLN: SUBCUTANEOUS | Status: DC

## 2011-11-13 MED ORDER — INSULIN NPH (HUMAN) (ISOPHANE) 100 UNIT/ML ~~LOC~~ SUSP: 15.0000 [IU] | Freq: Two times a day (BID) | SUBCUTANEOUS | Status: DC

## 2011-11-13 MED ORDER — INSULIN REGULAR HUMAN 100 UNIT/ML IJ SOLN: INTRAMUSCULAR | Status: DC

## 2011-11-13 MED ORDER — INSULIN NPH (HUMAN) (ISOPHANE) 100 UNIT/ML ~~LOC~~ SUSP: SUBCUTANEOUS | Status: DC

## 2011-11-13 NOTE — Telephone Encounter (Signed)
Took last dose lantus last night, can no longer use discount card each refill so 300$ and this is too much. She has decided to go to another type of insulin if possible. Blood sugars never  below 145- 150 now, has never had a low blood sugar.    Discussed [facility] insulin OTC is 25.00/vial and if she uses cloudy type like NPH which is often used once or twice daily to replace lantus she should mix it by gently rocking and rolling vial. She verbalized understanding by repeating back instructions.    Told her that i would speak to physician and get back to her today about changing insulin type.

## 2011-11-13 NOTE — Telephone Encounter (Signed)
Per discussion w Norm Parcel, changed pt from Lantus to NPH due to cost issues. Pt to take 15U NPH BID. Order sent to [facility] pharmacy on [address]. Ms. Plyler to call pt and discuss.

## 2011-11-13 NOTE — Telephone Encounter (Signed)
Pls sch Nov or Dec appt with Dr Heloise Beecham

## 2011-11-14 NOTE — Telephone Encounter (Signed)
Spoke with patient by phone, she verbalized understanding to new insulin order and will call with questions or concerns

## 2011-12-06 ENCOUNTER — Telehealth: Payer: Self-pay | Admitting: *Deleted

## 2011-12-06 NOTE — Telephone Encounter (Signed)
Pt called for a refill on cough medicine, cherratussin, she is asked to have an appt or go to urg care , states she will go to [place] care

## 2011-12-09 ENCOUNTER — Encounter: Payer: Self-pay | Admitting: Internal Medicine

## 2011-12-09 ENCOUNTER — Ambulatory Visit (INDEPENDENT_AMBULATORY_CARE_PROVIDER_SITE_OTHER): Payer: Self-pay | Admitting: Internal Medicine

## 2011-12-09 VITALS — BP 152/85 | HR 58 | Temp 99.0°F | Ht 68.0 in | Wt 178.1 lb

## 2011-12-09 DIAGNOSIS — I10 Essential (primary) hypertension: Secondary | ICD-10-CM

## 2011-12-09 DIAGNOSIS — J019 Acute sinusitis, unspecified: Secondary | ICD-10-CM

## 2011-12-09 DIAGNOSIS — J45909 Unspecified asthma, uncomplicated: Secondary | ICD-10-CM

## 2011-12-09 DIAGNOSIS — B9689 Other specified bacterial agents as the cause of diseases classified elsewhere: Secondary | ICD-10-CM

## 2011-12-09 DIAGNOSIS — Z23 Encounter for immunization: Secondary | ICD-10-CM

## 2011-12-09 DIAGNOSIS — E119 Type 2 diabetes mellitus without complications: Secondary | ICD-10-CM

## 2011-12-09 MED ORDER — AMOXICILLIN-POT CLAVULANATE 875-125 MG PO TABS: 1.0000 | ORAL_TABLET | Freq: Two times a day (BID) | ORAL | Status: AC

## 2011-12-09 MED ORDER — GUAIFENESIN-DM 100-10 MG/5ML PO SYRP: 5.0000 mL | ORAL_SOLUTION | Freq: Three times a day (TID) | ORAL | Status: DC | PRN

## 2011-12-09 NOTE — Assessment & Plan Note (Signed)
BP is somewhat elevated today, likely in the setting of acute illness. -recheck BP at next visit in 1-2 months

## 2011-12-09 NOTE — Progress Notes (Signed)
HPI The patient is a 66 y.o. yo female with a history of DM, HTN, HL, asthma, presenting for an acute visit for sinus drainage.  The patient notes a 2-week history of nasal congestion, cough productive of yellow sputum, frontal sinus pressure, sore throat upon awakening in the morning.  She also notes sick contacts (grandchild, daughter) with respiratory illnesses, and some mild occasional wheezing though not bad enough to use albuterol inhaler, as well as some "achey joints".  No fevers, no ear pain.  She has tried using saline solution in her nose to clear secretions with some benefit.  Patient has not yet received flu shot.  The patient recently changed to NPH insulin 15 units BID.  Her blood sugars have reportedly been in the 150-160's recently (forgot glucometer today).  No symptomatic hypoglycemia or hyperglycemia.  The patient continues to walk 3 times per day (not in the last few days due to illness).  ROS: General: no fevers, chills, changes in weight, changes in appetite Skin: no rash HEENT: see HPI Pulm: see HPI CV: no chest pain, palpitations, shortness of breath Abd: no abdominal pain, nausea/vomiting, diarrhea/constipation GU: no dysuria, hematuria, polyuria Ext: see HPI Neuro: no weakness, numbness, or tingling  Filed Vitals:   12/09/11 1318  BP: 152/85  Pulse: 58  Temp: 99 F (37.2 C)    PEX General: alert, cooperative, and in no apparent distress HEENT: PERRL, EOMI, nasal turbinates erythematous, oropharynx non-erythematous with no tonsillar exudate, frontal sinuses with discomfort to palpation Neck: supple, no lymphadenopathy Lungs: clear to ascultation bilaterally, no wheezes, rales, or ronchi, but deep breathing triggers cough Heart: regular rate and rhythm, no murmurs, gallops, or rubs Abdomen: soft, non-tender, non-distended, normal bowel sounds Extremities: no cyanosis, clubbing, or edema Neurologic: alert & oriented X3, cranial nerves II-XII intact, strength  grossly intact, sensation intact to light touch  Current Outpatient Prescriptions on File Prior to Visit  Medication Sig Dispense Refill  . albuterol (PROVENTIL HFA) 108 (90 BASE) MCG/ACT inhaler Inhale 2 puffs into the lungs every 4 (four) hours as needed for shortness of breath.  3 each  4  . cetirizine (ZYRTEC) 10 MG tablet Take 1 tablet (10 mg total) by mouth daily.  30 tablet  4  . hydrochlorothiazide (HYDRODIURIL) 25 MG tablet Take 1 tablet (25 mg total) by mouth daily.  90 tablet  3  . insulin NPH (NOVOLIN N RELION) 100 UNIT/ML injection Inject 15 Units into the skin 2 (two) times daily.  1 vial  12  . Insulin Pen Needle 32G X 6 MM MISC 1 Device by Does not apply route Nightly.  60 each  11  . Insulin Syringe-Needle U-100 (INSULIN SYRINGE .5CC/31GX5/16") 31G X 5/16" 0.5 ML MISC Please use to draw up insulin as prescribed  100 each  5  . metFORMIN (GLUCOPHAGE) 1000 MG tablet Take 1 tablet (1,000 mg total) by mouth 2 (two) times daily with a meal.  90 tablet  3  . omeprazole (PRILOSEC) 40 MG capsule Take 1 capsule (40 mg total) by mouth daily.  90 capsule  3  . oxyCODONE-acetaminophen (ROXICET) 5-325 MG per tablet Take 1 tablet by mouth every 6 (six) hours as needed for pain.  30 tablet  0  . pravastatin (PRAVACHOL) 20 MG tablet Take 1 tablet (20 mg total) by mouth at bedtime.  90 tablet  4    Assessment/Plan

## 2011-12-09 NOTE — Assessment & Plan Note (Signed)
Patient recently changed from Lantus to NPH.  Patient did not bring glucometer today, but reports CBG's in the 150's-160's.  As today is an acute visit, and patient did not bring glucometer, I will defer changing the patient's insulin regimen at this time. -patient instructed to bring glucometer to next visit -continue NPH insulin 15 U BID -patient given flu shot today

## 2011-12-09 NOTE — Assessment & Plan Note (Signed)
No wheezing on exam today, and patient reports not having to use albuterol inhaler despite sinusitis symptoms.  Well-controlled for now.

## 2011-12-09 NOTE — Assessment & Plan Note (Signed)
Symptoms likely represent acute bacterial sinusitis, given duration of symptoms x2 weeks, nasal drainage, and sinus tenderness to palpation.  I do not suspect CAP at this time as lungs are clear bilaterally; cough with yellow sputum likely represents post-nasal drip. -Augmentin 875-125 mg BID x7 days -Robitussin DM cough syrup PRN for congestion

## 2011-12-09 NOTE — Patient Instructions (Signed)
Your symptoms are likely due to a sinus infection -Take Augmentin (an antibiotic), 1 tablet twice per day for 7 days -I have also prescribed Robitussin cough syrup to use as needed for symptoms  Please return in 1-2 months for a follow-up of Diabetes, and be sure to bring your Glucometer to this visit.   Sinusitis Sinusitis is redness, soreness, and swelling (inflammation) of the paranasal sinuses. Paranasal sinuses are air pockets within the bones of your face (beneath the eyes, the middle of the forehead, or above the eyes). In healthy paranasal sinuses, mucus is able to drain out, and air is able to circulate through them by way of your nose. However, when your paranasal sinuses are inflamed, mucus and air can become trapped. This can allow bacteria and other germs to grow and cause infection. Sinusitis can develop quickly and last only a short time (acute) or continue over a long period (chronic). Sinusitis that lasts for more than 12 weeks is considered chronic.  CAUSES  Causes of sinusitis include:  Allergies.  Structural abnormalities, such as displacement of the cartilage that separates your nostrils (deviated septum), which can decrease the air flow through your nose and sinuses and affect sinus drainage.  Functional abnormalities, such as when the small hairs (cilia) that line your sinuses and help remove mucus do not work properly or are not present. SYMPTOMS  Symptoms of acute and chronic sinusitis are the same. The primary symptoms are pain and pressure around the affected sinuses. Other symptoms include:  Upper toothache.  Earache.  Headache.  Bad breath.  Decreased sense of smell and taste.  A cough, which worsens when you are lying flat.  Fatigue.  Fever.  Thick drainage from your nose, which often is green and may contain pus (purulent).  Swelling and warmth over the affected sinuses. DIAGNOSIS  Your caregiver will perform a physical exam. During the exam,  your caregiver may:  Look in your nose for signs of abnormal growths in your nostrils (nasal polyps).  Tap over the affected sinus to check for signs of infection.  View the inside of your sinuses (endoscopy) with a special imaging device with a light attached (endoscope), which is inserted into your sinuses. If your caregiver suspects that you have chronic sinusitis, one or more of the following tests may be recommended:  Allergy tests.  Nasal culture A sample of mucus is taken from your nose and sent to a lab and screened for bacteria.  Nasal cytology A sample of mucus is taken from your nose and examined by your caregiver to determine if your sinusitis is related to an allergy. TREATMENT  Most cases of acute sinusitis are related to a viral infection and will resolve on their own within 10 days. Sometimes medicines are prescribed to help relieve symptoms (pain medicine, decongestants, nasal steroid sprays, or saline sprays).  However, for sinusitis related to a bacterial infection, your caregiver will prescribe antibiotic medicines. These are medicines that will help kill the bacteria causing the infection.  Rarely, sinusitis is caused by a fungal infection. In theses cases, your caregiver will prescribe antifungal medicine. For some cases of chronic sinusitis, surgery is needed. Generally, these are cases in which sinusitis recurs more than 3 times per year, despite other treatments. HOME CARE INSTRUCTIONS   Drink plenty of water. Water helps thin the mucus so your sinuses can drain more easily.  Use a humidifier.  Inhale steam 3 to 4 times a day (for example, sit in the bathroom  with the shower running).  Apply a warm, moist washcloth to your face 3 to 4 times a day, or as directed by your caregiver.  Use saline nasal sprays to help moisten and clean your sinuses.  Take over-the-counter or prescription medicines for pain, discomfort, or fever only as directed by your  caregiver. SEEK IMMEDIATE MEDICAL CARE IF:  You have increasing pain or severe headaches.  You have nausea, vomiting, or drowsiness.  You have swelling around your face.  You have vision problems.  You have a stiff neck.  You have difficulty breathing. MAKE SURE YOU:   Understand these instructions.  Will watch your condition.  Will get help right away if you are not doing well or get worse. Document Released: 02/04/2005 Document Revised: 04/29/2011 Document Reviewed: 02/19/2011 [facility] Patient Information 2013 [place], [place].

## 2011-12-11 ENCOUNTER — Telehealth: Payer: Self-pay | Admitting: *Deleted

## 2011-12-11 NOTE — Telephone Encounter (Signed)
The patient notes an "aching" pain in her right lower extremity from her right posterior thigh to right buttocks.  No left leg pain, no motor dysfunction, no loss of bowel/bladder.  This developed 2 days ago.  She started Augmentin and Equate 2 days ago, prior to symptom onset.  No other new medications.  She took an ibuprofen and used heat packs, and now notes that the pain is improving  The etiology of this myalgia is unclear.  Neither of the prescribed medications are known to cause rhabdo, and her symptoms are localized to the Right upper leg.  As the pain is improving, I've advised her to continue her current medications, and continue ibuprofen and heat packs for pain.  If pain doesn't continue to improve, I advised patient to return to clinic for a follow-up visit.  Patient expressed understanding and thanks.

## 2011-12-11 NOTE — Telephone Encounter (Signed)
Pt called stating she was seen in clinic on 10/21  She is asking if any meds she was prescribed could cause pain in legs.  Her rt leg is aching so bad she can hardly walk on it. Onset 2 days ago.  Pt # [id number]

## 2012-01-01 ENCOUNTER — Other Ambulatory Visit (INDEPENDENT_AMBULATORY_CARE_PROVIDER_SITE_OTHER): Payer: Self-pay

## 2012-01-01 DIAGNOSIS — Z1211 Encounter for screening for malignant neoplasm of colon: Secondary | ICD-10-CM

## 2012-01-01 LAB — POC HEMOCCULT BLD/STL (HOME/3-CARD/SCREEN)
Card #2 Fecal Occult Blod, POC: NEGATIVE
Card #3 Fecal Occult Blood, POC: NEGATIVE
Fecal Occult Blood, POC: NEGATIVE

## 2012-01-28 ENCOUNTER — Encounter: Payer: Self-pay | Admitting: Internal Medicine

## 2012-02-21 ENCOUNTER — Telehealth: Payer: Self-pay | Admitting: *Deleted

## 2012-02-21 NOTE — Telephone Encounter (Signed)
I talked with Dr Dalphine Handing and she recommended watching area over week end and if not better call in Monday for evaluation. If any increase pain or swelling over week end please go to [facility].

## 2012-02-21 NOTE — Telephone Encounter (Signed)
Pt called with c/o pain to right leg from heal to thigh.  Also burning sensation to foot.  No known injury.  Onset 1 days ago.   Today leg is back to normal, but still has burning and tingling to tip of rt great toe. This is on and off. Nothing helps remove this feeling, she has tried walking, and elevation. She denies  redness, heat, or swelling.  She has taken IBU last night, none today. She is asking what else can she do. Pt # [id number]

## 2012-03-02 ENCOUNTER — Telehealth: Payer: Self-pay | Admitting: *Deleted

## 2012-03-02 ENCOUNTER — Emergency Department (INDEPENDENT_AMBULATORY_CARE_PROVIDER_SITE_OTHER)
Admission: EM | Admit: 2012-03-02 | Discharge: 2012-03-02 | Disposition: A | Discharge status: Home / Self Care | Payer: Self-pay | Source: Home / Self Care

## 2012-03-02 ENCOUNTER — Encounter (HOSPITAL_COMMUNITY): Payer: Self-pay

## 2012-03-02 DIAGNOSIS — A088 Other specified intestinal infections: Secondary | ICD-10-CM

## 2012-03-02 DIAGNOSIS — E119 Type 2 diabetes mellitus without complications: Secondary | ICD-10-CM

## 2012-03-02 DIAGNOSIS — A084 Viral intestinal infection, unspecified: Secondary | ICD-10-CM

## 2012-03-02 LAB — POCT URINALYSIS DIP (DEVICE)
Bilirubin Urine: NEGATIVE
Glucose, UA: 500 mg/dL — AB
Hgb urine dipstick: NEGATIVE
Ketones, ur: 15 mg/dL — AB
Leukocytes, UA: NEGATIVE
Nitrite: NEGATIVE
Protein, ur: 30 mg/dL — AB
Specific Gravity, Urine: >=1.03 (ref 1.005–1.030)
Urobilinogen, UA: 0.2 mg/dL (ref 0.0–1.0)
pH: 5.5 (ref 5.0–8.0)

## 2012-03-02 LAB — POCT I-STAT, CHEM 8
BUN: 14 mg/dL (ref 6–23)
Calcium, Ion: 1.24 mmol/L (ref 1.13–1.30)
Chloride: 101 mEq/L (ref 96–112)
Creatinine, Ser: 0.9 mg/dL (ref 0.50–1.10)
Glucose, Bld: 230 mg/dL — ABNORMAL HIGH (ref 70–99)
HCT: 41 % (ref 36.0–46.0)
Hemoglobin: 13.9 g/dL (ref 12.0–15.0)
Potassium: 3.8 mEq/L (ref 3.5–5.1)
Sodium: 140 mEq/L (ref 135–145)
TCO2: 28 mmol/L (ref 0–100)

## 2012-03-02 LAB — GLUCOSE, CAPILLARY: Glucose-Capillary: 222 mg/dL — ABNORMAL HIGH (ref 70–99)

## 2012-03-02 MED ORDER — ONDANSETRON HCL 4 MG PO TABS: 4.0000 mg | ORAL_TABLET | Freq: Four times a day (QID) | ORAL | Status: DC

## 2012-03-02 NOTE — Telephone Encounter (Signed)
Agree, thank you! Neale Marzette 

## 2012-03-02 NOTE — Telephone Encounter (Signed)
Pt calls and states her blood sugars have been high recently, she thinks in part to "letting her diet get out of control" during the holidays and still not back to her reg diab diet. She also states for several days she has diarrhea and upset stomach, denies vomiting but does have nausea, trying to watch what she eats and has been taking pepto bismal with some relief. She states she had 4 diarrhea stools yesterday and has had 2 so far today, fasting sugar this am 249, has not eaten nor taken her insulin, she is ask to do both, avoiding greasy, spicy foods, to take bland foods and liquids if possible. The clinic has no available appts today and she is referred to urg care asap, she is agreeable

## 2012-03-02 NOTE — ED Provider Notes (Signed)
Medical screening examination/treatment/procedure(s) were performed by non-physician practitioner and as supervising physician I was immediately available for consultation/collaboration.  Marwan Lipe, M.D.   Azalia Neuberger C Amora Sheehy, MD 03/02/12 1602 

## 2012-03-02 NOTE — ED Provider Notes (Signed)
History     CSN: [id number]  Arrival date & time 03/02/12  1111   First MD Initiated Contact with Patient 03/02/12 1223      Chief Complaint  Patient presents with  . Nausea    (Consider location/radiation/quality/duration/timing/severity/associated sxs/prior treatment) HPI Comments: 67 year old female accompanied by her husband has been experiencing weakness and nausea for the past 3-4 days. She states during this time her blood sugar has increased up to as a method of 499. She states her blood sugars are usually fairly well-controlled and under 200. She states during this time she had been eating more fruit than usual. 2 days ago she developed nausea and dizziness and today said  loose stools, nausea, weakness and vomited in route to the urgent care. She denies fevers, and upper respiratory symptoms. She is treated with insulin NPH and metformin 1000 mg twice a day for her diabetes.   Past Medical History  Diagnosis Date  . Asthma   . Diabetes mellitus   . GERD (gastroesophageal reflux disease)   . Hypertension   . Hyperlipidemia     Past Surgical History  Procedure Date  . Cholecystectomy 2006  . Tubal ligation 1988  . Cesarean section 1988  . Fracture surgery 1992    lEFT ANKLE orif    Family History  Problem Relation Age of Onset  . Cancer Mother 28    lUNG CANCER  . Heart disease Father 55    History  Substance Use Topics  . Smoking status: Former Games developer  . Smokeless tobacco: Not on file     Comment: stopped in1975  . Alcohol Use: No    OB History    Grav Para Term Preterm Abortions TAB SAB Ect Mult Living                  Review of Systems  Constitutional: Positive for activity change, appetite change and fatigue. Negative for fever.  HENT: Negative.   Eyes: Negative.   Respiratory: Negative.   Cardiovascular: Negative.   Gastrointestinal: Positive for nausea, vomiting and diarrhea. Negative for abdominal pain.  Genitourinary: Positive for  frequency. Negative for urgency, vaginal bleeding and pelvic pain.  Musculoskeletal: Negative.   Skin: Negative.   Neurological: Positive for dizziness and light-headedness. Negative for tremors, seizures, syncope and speech difficulty.  Psychiatric/Behavioral: Negative.     Allergies  Other  Home Medications   Current Outpatient Rx  Name  Route  Sig  Dispense  Refill  . ALBUTEROL SULFATE HFA 108 (90 BASE) MCG/ACT IN AERS   Inhalation   Inhale 2 puffs into the lungs every 4 (four) hours as needed for shortness of breath.   3 each   4   . CETIRIZINE HCL 10 MG PO TABS   Oral   Take 1 tablet (10 mg total) by mouth daily.   30 tablet   4   . GUAIFENESIN-DM 100-10 MG/5ML PO SYRP   Oral   Take 5 mLs by mouth 3 (three) times daily as needed for cough.   118 mL   0   . HYDROCHLOROTHIAZIDE 25 MG PO TABS   Oral   Take 1 tablet (25 mg total) by mouth daily.   90 tablet   3   . INSULIN ISOPHANE HUMAN 100 UNIT/ML [place] SUSP   Subcutaneous   Inject 15 Units into the skin 2 (two) times daily.   1 vial   12   . INSULIN PEN NEEDLE 32G X 6 MM MISC  Does not apply   1 Device by Does not apply route Nightly.   60 each   11   . INSULIN SYRINGE 31G X 5/16" 0.5 ML MISC      Please use to draw up insulin as prescribed   100 each   5   . METFORMIN HCL 1000 MG PO TABS   Oral   Take 1 tablet (1,000 mg total) by mouth 2 (two) times daily with a meal.   90 tablet   3   . OMEPRAZOLE 40 MG PO CPDR   Oral   Take 1 capsule (40 mg total) by mouth daily.   90 capsule   3   . ONDANSETRON HCL 4 MG PO TABS   Oral   Take 1 tablet (4 mg total) by mouth every 6 (six) hours.   12 tablet   0   . OXYCODONE-ACETAMINOPHEN 5-325 MG PO TABS   Oral   Take 1 tablet by mouth every 6 (six) hours as needed for pain.   30 tablet   0   . PRAVASTATIN SODIUM 20 MG PO TABS   Oral   Take 1 tablet (20 mg total) by mouth at bedtime.   90 tablet   4     BP 149/92  Pulse 83  Temp 97.6 F  (36.4 C) (Oral)  Resp 16  SpO2 100%  Physical Exam  Nursing note and vitals reviewed. Constitutional: She is oriented to person, place, and time. She appears well-developed and well-nourished. No distress.  HENT:  Head: Normocephalic and atraumatic.  Mouth/Throat: Oropharynx is clear and moist. No oropharyngeal exudate.       Bilateral TMs are normal  Eyes: EOM are normal. Pupils are equal, round, and reactive to light.  Neck: Normal range of motion. Neck supple.  Cardiovascular: Normal rate and normal heart sounds.   Pulmonary/Chest: Effort normal and breath sounds normal. No respiratory distress. She has no wheezes.  Abdominal: Soft. There is no tenderness. There is no rebound.  Musculoskeletal: Normal range of motion. She exhibits no tenderness.  Lymphadenopathy:    She has no cervical adenopathy.  Neurological: She is alert and oriented to person, place, and time. No cranial nerve deficit.  Skin: Skin is warm and dry.  Psychiatric: She has a normal mood and affect.    ED Course  Procedures (including critical care time)  Labs Reviewed  GLUCOSE, CAPILLARY - Abnormal; Notable for the following:    Glucose-Capillary 222 (*)     All other components within normal limits  POCT URINALYSIS DIP (DEVICE) - Abnormal; Notable for the following:    Glucose, UA 500 (*)     Ketones, ur 15 (*)     Protein, ur 30 (*)     All other components within normal limits  POCT I-STAT, CHEM 8 - Abnormal; Notable for the following:    Glucose, Bld 230 (*)     All other components within normal limits   No results found.   1. Viral gastroenteritis   2. Diabetes       MDM   Results for orders placed during the hospital encounter of 03/02/12  GLUCOSE, CAPILLARY      Component Value Range   Glucose-Capillary 222 (*) 70 - 99 mg/dL  POCT URINALYSIS DIP (DEVICE)      Component Value Range   Glucose, UA 500 (*) NEGATIVE mg/dL   Bilirubin Urine NEGATIVE  NEGATIVE   Ketones, ur 15 (*)  NEGATIVE mg/dL   Specific  Gravity, Urine >=1.030  1.005 - 1.030   Hgb urine dipstick NEGATIVE  NEGATIVE   pH 5.5  5.0 - 8.0   Protein, ur 30 (*) NEGATIVE mg/dL   Urobilinogen, UA 0.2  0.0 - 1.0 mg/dL   Nitrite NEGATIVE  NEGATIVE   Leukocytes, UA NEGATIVE  NEGATIVE  POCT I-STAT, CHEM 8      Component Value Range   Sodium 140  135 - 145 mEq/L   Potassium 3.8  3.5 - 5.1 mEq/L   Chloride 101  96 - 112 mEq/L   BUN 14  6 - 23 mg/dL   Creatinine, Ser 0.45  0.50 - 1.10 mg/dL   Glucose, Bld 409 (*) 70 - 99 mg/dL   Calcium, Ion 8.11  9.14 - 1.30 mmol/L   TCO2 28  0 - 100 mmol/L   Hemoglobin 13.9  12.0 - 15.0 g/dL   HCT 78.2  95.6 - 21.3 %   Zofran 4 mg every 4 hours when necessary nausea. She is encouraged to drink plenty of fluids. Especially water. She may also add Pedialyte for electrolyte replacement. For diarrhea she may take Imodium right ear when necessary however cannot take so many as to stop the diarrhea. Only take if you have not 3 or more stools per day. Slowly advance diet as discussed. No fast foods, greasy foods or fried foods. Home to rest and no work for the next 3-4 days. Increase your NPH insulin 2- 3 units each dose when blood sugar rises above 200. Monitor blood sugars more often. Her BUN creatinine are well within normal range. So I believe it is safe for her to continue to metformin. If you are unable to continue your fluids And stay well hydrated may need to return or go to emergency department for fluid rehydration.         Hayden Rasmussen, NP 03/02/12 1444

## 2012-03-02 NOTE — ED Notes (Signed)
Relates 3 day history if nausea, vomiting, high CBG readings ; note in chart indicates there was no appointment w her usual provider, and was directed to come to [facility] for evaluation; NAD at present

## 2012-03-04 ENCOUNTER — Telehealth: Payer: Self-pay | Admitting: *Deleted

## 2012-03-04 NOTE — Telephone Encounter (Signed)
Pt called stating her CBG's have been running 300's for 2 days.  She was seen and treated @ [facility] on 1/13 for viral gastroenteritis. Pt states she has been on liquid diet and is feeling better. Today CBG is 92, pt advised to advance to BRAT diet, small amounts and increase daily.  Monitor her sugar and call if she has any problems. Pt voices understanding.

## 2012-03-06 NOTE — Telephone Encounter (Signed)
Agree with advice.   Pls make routine F/U appt with Dr Heloise Beecham as her last cont appt was July and A1C was 10. Pt has been trying to get DM under control but hasn't been F/U since then. No insurance - that may be why she hasn't come back.

## 2012-03-10 ENCOUNTER — Other Ambulatory Visit: Payer: Self-pay | Admitting: Internal Medicine

## 2012-03-10 NOTE — Telephone Encounter (Signed)
Appointment scheduled.

## 2012-03-16 ENCOUNTER — Telehealth: Payer: Self-pay | Admitting: *Deleted

## 2012-03-16 MED ORDER — LOVASTATIN 20 MG PO TABS: 20.0000 mg | ORAL_TABLET | Freq: Every day | ORAL | Status: DC

## 2012-03-16 NOTE — Telephone Encounter (Signed)
Pt called ans stated [facility] has taken pravastatin off the $4 list.  It now cost $40.  I called [facility], and  They stated that lovastatin 10mg  and 20mg  are on the $4 list.  Would consider a change?  Pt # [id number]

## 2012-03-16 NOTE — Telephone Encounter (Signed)
Lovastatin 20mg  sent to pharmacy. Thank you

## 2012-03-17 ENCOUNTER — Ambulatory Visit (INDEPENDENT_AMBULATORY_CARE_PROVIDER_SITE_OTHER): Payer: Self-pay | Admitting: Internal Medicine

## 2012-03-17 VITALS — BP 123/75 | HR 84 | Temp 97.6°F | Ht 67.0 in | Wt 173.2 lb

## 2012-03-17 DIAGNOSIS — M7580 Other shoulder lesions, unspecified shoulder: Secondary | ICD-10-CM

## 2012-03-17 DIAGNOSIS — Z79899 Other long term (current) drug therapy: Secondary | ICD-10-CM

## 2012-03-17 DIAGNOSIS — E119 Type 2 diabetes mellitus without complications: Secondary | ICD-10-CM

## 2012-03-17 DIAGNOSIS — M67919 Unspecified disorder of synovium and tendon, unspecified shoulder: Secondary | ICD-10-CM

## 2012-03-17 LAB — GLUCOSE, CAPILLARY: Glucose-Capillary: 208 mg/dL — ABNORMAL HIGH (ref 70–99)

## 2012-03-17 LAB — POCT GLYCOSYLATED HEMOGLOBIN (HGB A1C): Hemoglobin A1C: 10.5

## 2012-03-17 MED ORDER — IBUPROFEN 600 MG PO TABS: 600.0000 mg | ORAL_TABLET | Freq: Four times a day (QID) | ORAL | Status: DC | PRN

## 2012-03-17 NOTE — Progress Notes (Signed)
Subjective:   Patient ID: ROSAISELA Haynes female   DOB: 1945/07/19 67 y.o.   MRN: [id number]  HPI: [name] is a 67 y.o. with past medical history of diabetes presents for an acute visit regarding left shoulder pain. Patient members about a month ago up in woman move some heavy boxes and then lifted something that was too heavy for her up above her head and then noticed recently some left shoulder pain. Since that time she has had some decreased range of motion in the left shoulder secondary to pain but that pain is adequately relieved with ibuprofen 600 mg the patient was taking at home. The patient has feels that although slightly improving the duration of the pain has been going on and wanted to be evaluated. The patient did not hear a snap or clunk and was able to fully use her arm and shoulder after the incident. Patient has had no numbness or tingling from her neck to her fingers. The patient has had no associated weakness in the right or left arm. The patient is uninsured at this point and would not like to pursue imaging and the patient has not had any other injuries to her shoulder in the past.    Past Medical History  Diagnosis Date  . Asthma   . Diabetes mellitus   . GERD (gastroesophageal reflux disease)   . Hypertension   . Hyperlipidemia    Current Outpatient Prescriptions  Medication Sig Dispense Refill  . albuterol (PROVENTIL HFA) 108 (90 BASE) MCG/ACT inhaler Inhale 2 puffs into the lungs every 4 (four) hours as needed for shortness of breath.  3 each  4  . cetirizine (ZYRTEC) 10 MG tablet Take 1 tablet (10 mg total) by mouth daily.  30 tablet  4  . guaiFENesin-dextromethorphan (ROBITUSSIN DM) 100-10 MG/5ML syrup Take 5 mLs by mouth 3 (three) times daily as needed for cough.  118 mL  0  . hydrochlorothiazide (HYDRODIURIL) 25 MG tablet Take 1 tablet (25 mg total) by mouth daily.  90 tablet  3  . ibuprofen (ADVIL,MOTRIN) 600 MG tablet Take 1 tablet (600 mg total)  by mouth every 6 (six) hours as needed for pain.  60 tablet  0  . insulin NPH (NOVOLIN N RELION) 100 UNIT/ML injection Inject 15 Units into the skin 2 (two) times daily.  1 vial  12  . Insulin Pen Needle 32G X 6 MM MISC 1 Device by Does not apply route Nightly.  60 each  11  . Insulin Syringe-Needle U-100 (INSULIN SYRINGE .5CC/31GX5/16") 31G X 5/16" 0.5 ML MISC Please use to draw up insulin as prescribed  100 each  5  . lovastatin (MEVACOR) 20 MG tablet Take 1 tablet (20 mg total) by mouth daily.  30 tablet  11  . metFORMIN (GLUCOPHAGE) 1000 MG tablet Take 1 tablet (1,000 mg total) by mouth 2 (two) times daily with a meal.  90 tablet  3  . omeprazole (PRILOSEC) 40 MG capsule Take 1 capsule (40 mg total) by mouth daily.  90 capsule  3  . ondansetron (ZOFRAN) 4 MG tablet Take 1 tablet (4 mg total) by mouth every 6 (six) hours.  12 tablet  0  . oxyCODONE-acetaminophen (ROXICET) 5-325 MG per tablet Take 1 tablet by mouth every 6 (six) hours as needed for pain.  30 tablet  0   Family History  Problem Relation Age of Onset  . Cancer Mother 87    lUNG CANCER  . Heart disease  Father 4   History   Social History  . Marital Status: Married    Spouse Name: N/A    Number of Children: N/A  . Years of Education: N/A   Social History Main Topics  . Smoking status: Former Games developer  . Smokeless tobacco: Not on file     Comment: stopped in1975  . Alcohol Use: No  . Drug Use: No  . Sexually Active: Yes    Birth Control/ Protection: Surgical   Other Topics Concern  . Not on file   Social History Narrative  . No narrative on file   Review of Systems: otherwise negative unless listed in HPI  Objective:  Physical Exam: Filed Vitals:   03/17/12 1434  BP: 123/75  Pulse: 84  Temp: 97.6 F (36.4 C)  TempSrc: Oral  Height: 5\' 7"  (1.702 m)  Weight: 173 lb 3.2 oz (78.563 kg)  SpO2: 98%   General: NAD HEENT: PERRL, EOMI, no scleral icterus Cardiac: RRR, no rubs, murmurs or gallops Pulm:  clear to auscultation bilaterally, moving normal volumes of air Abd: soft, nontender, nondistended, BS present Ext: warm and well perfused, no pedal edema MS: left shoulder symmetrical, some trapezius muscle tightness, extension of left shoulder limited by pain, +beer can sign on left, + apley test for left shoulder, right shoulder FROM, normal sensation, 5/5 strength, equal 5/5 hand grip Neuro: alert and oriented X3, cranial nerves II-XII grossly intact  Assessment & Plan:  1. rotator cuff tendinitis: Patient remembers mechanism of injury was lifting a box that was very heavy up in above her head and remembered feeling some pain in her left shoulder at that time. The patient did improve with ibuprofen 600 mg that she was taking at home. This provided total resolution of her pain. She presents today because the pain has been ongoing but has no insurance and therefore refused any imaging studies at this time. On physical exam isolation to supraspinatus tendon was made. -Education material for rotator cuff exercises was given -Ibuprofen 600 mg -Discussion regarding possible need for steroid injection was had with the patient if continues to have problems  Patient was discussed with Dr. Rogelia Boga

## 2012-03-20 ENCOUNTER — Other Ambulatory Visit: Payer: Self-pay | Admitting: Internal Medicine

## 2012-03-20 DIAGNOSIS — Z1231 Encounter for screening mammogram for malignant neoplasm of breast: Secondary | ICD-10-CM

## 2012-03-24 ENCOUNTER — Ambulatory Visit (INDEPENDENT_AMBULATORY_CARE_PROVIDER_SITE_OTHER): Payer: Self-pay | Admitting: Internal Medicine

## 2012-03-24 ENCOUNTER — Encounter: Payer: Self-pay | Admitting: Internal Medicine

## 2012-03-24 ENCOUNTER — Telehealth: Payer: Self-pay | Admitting: *Deleted

## 2012-03-24 VITALS — BP 151/95 | HR 110 | Temp 98.3°F | Ht 67.9 in | Wt 173.2 lb

## 2012-03-24 DIAGNOSIS — I10 Essential (primary) hypertension: Secondary | ICD-10-CM

## 2012-03-24 DIAGNOSIS — J40 Bronchitis, not specified as acute or chronic: Secondary | ICD-10-CM

## 2012-03-24 DIAGNOSIS — J45909 Unspecified asthma, uncomplicated: Secondary | ICD-10-CM

## 2012-03-24 DIAGNOSIS — E119 Type 2 diabetes mellitus without complications: Secondary | ICD-10-CM

## 2012-03-24 LAB — GLUCOSE, CAPILLARY: Glucose-Capillary: 191 mg/dL — ABNORMAL HIGH (ref 70–99)

## 2012-03-24 MED ORDER — PREDNISONE 20 MG PO TABS: ORAL_TABLET | ORAL | Status: DC

## 2012-03-24 MED ORDER — AZITHROMYCIN 250 MG PO TABS: ORAL_TABLET | ORAL | Status: DC

## 2012-03-24 MED ORDER — GUAIFENESIN 100 MG/5ML PO LIQD: 200.0000 mg | Freq: Three times a day (TID) | ORAL | Status: DC | PRN

## 2012-03-24 NOTE — Telephone Encounter (Signed)
Pt calls and states she is sure she has bronchitis and needs to be seen, i mistakenly placed her in an appt for dr Zada Girt at 1445, nothing really available will take her to ED when she arrives. States she has been this way for 2 days, very hard, angry cough, not resting

## 2012-03-24 NOTE — Patient Instructions (Addendum)
General Instructions: Please keep up with your scheduled a follow up appointment. Please bring your medication bottles with your next appointment. Please take your medicines as prescribed. Please do warm saline gargles and steam inhalation. Call us back in 1 week if symptoms get worse.    Treatment Goals:  Goals (1 Years of Data) as of 03/24/2012          As of Today 03/17/12 03/02/12 12/09/11 10/01/11     Blood Pressure    . Blood Pressure < 140/90  151/95 123/75 149/92 152/85 137/86     Lifestyle    . Plan meals- 50-60 grams carb for meals 40-45 for snack      No     Result Component    . HEMOGLOBIN A1C < 7.0   10.5       . LDL CALC < 100            Progress Toward Treatment Goals:  Treatment Goal 03/24/2012  Hemoglobin A1C unable to assess  Blood pressure deteriorated    Self Care Goals & Plans:  Self Care Goal 03/24/2012  Manage my medications take my medicines as prescribed; bring my medications to every visit; refill my medications on time  Monitor my health -  Eat healthy foods drink diet soda or water instead of juice or soda; eat more vegetables; eat foods that are low in salt    Home Blood Glucose Monitoring 03/24/2012  Check my blood sugar 3 times a day  When to check my blood sugar before meals     Care Management & Community Referrals:  Referral 03/24/2012  Referrals made for care management support none needed

## 2012-03-24 NOTE — Progress Notes (Signed)
Subjective:   Patient ID: Alyssa Haynes female   DOB: 1945-05-15 67 y.o.   MRN: [id number]  HPI: 67 year old woman with past medical history significant for asthma/ bronchitis, hypertension, poorly controlled type 2 diabetes with A1c of 10.5 in 01/14 presents to the clinic for cough and SOB x 3 days.  Patient reports that she had sudden onset of productive cough for last 3 days. She states that she is bringing up yellowish stuff with her cough but denies any blood. She also reports having shortness of breath. She has been using her albuterol inhaler and nebulizers. On her normal days she would never use albuterol inhaler but for last 3 days she has been using her albuterol inhaler about 3-4 times a day and also used albuterol nebulizer last night and this AM. She also reports having runny nose and postnasal drip but denies any headaches, chest pain, fever, chills, posttussive emesis. Reports off and on wheezing.  DM: She states that her blood sugars are in in 200s. She did not bring her glucometer today.      Past Medical History  Diagnosis Date  . Asthma   . Diabetes mellitus   . GERD (gastroesophageal reflux disease)   . Hypertension   . Hyperlipidemia    Family History  Problem Relation Age of Onset  . Cancer Mother 25    lUNG CANCER  . Heart disease Father 7   History   Social History  . Marital Status: Married    Spouse Name: N/A    Number of Children: N/A  . Years of Education: N/A   Occupational History  . Not on file.   Social History Main Topics  . Smoking status: Former Games developer  . Smokeless tobacco: Not on file     Comment: stopped in1975  . Alcohol Use: No  . Drug Use: No  . Sexually Active: Yes    Birth Control/ Protection: Surgical   Other Topics Concern  . Not on file   Social History Narrative  . No narrative on file   Review of Systems: General: Denies fever, chills, diaphoresis, appetite change and fatigue. HEENT: Denies photophobia, eye  pain, redness, hearing loss, ear pain, + congestion, sore throat, + rhinorrhea, sneezing, mouth sores, trouble swallowing, neck pain, neck stiffness and tinnitus., + postnasal drip  Respiratory: Denies x, DOE, , chest tightness, and wheezing, +  Cough ( yellowish phlegm ), +  SOB. Cardiovascular: Denies to chest pain, palpitations and leg swelling. Gastrointestinal: Denies nausea, vomiting, abdominal pain, diarrhea, constipation, blood in stool and abdominal distention. Genitourinary: Denies dysuria, urgency, frequency, hematuria, flank pain and difficulty urinating. Musculoskeletal: Denies myalgias, back pain, joint swelling, arthralgias and gait problem.  Skin: Denies pallor, rash and wound. Neurological: Denies dizziness, seizures, syncope, weakness, light-headedness, numbness and headaches. Hematological: Denies adenopathy, easy bruising, personal or family bleeding history. Psychiatric/Behavioral: Denies suicidal ideation, mood changes, confusion, nervousness, sleep disturbance and agitation.    Current Outpatient Medications: Current Outpatient Prescriptions  Medication Sig Dispense Refill  . albuterol (PROVENTIL HFA) 108 (90 BASE) MCG/ACT inhaler Inhale 2 puffs into the lungs every 4 (four) hours as needed for shortness of breath.  3 each  4  . cetirizine (ZYRTEC) 10 MG tablet Take 1 tablet (10 mg total) by mouth daily.  30 tablet  4  . guaiFENesin-dextromethorphan (ROBITUSSIN DM) 100-10 MG/5ML syrup Take 5 mLs by mouth 3 (three) times daily as needed for cough.  118 mL  0  . hydrochlorothiazide (HYDRODIURIL) 25 MG tablet Take  1 tablet (25 mg total) by mouth daily.  90 tablet  3  . ibuprofen (ADVIL,MOTRIN) 600 MG tablet Take 1 tablet (600 mg total) by mouth every 6 (six) hours as needed for pain.  60 tablet  0  . insulin NPH (NOVOLIN N RELION) 100 UNIT/ML injection Inject 15 Units into the skin 2 (two) times daily.  1 vial  12  . Insulin Pen Needle 32G X 6 MM MISC 1 Device by Does not  apply route Nightly.  60 each  11  . Insulin Syringe-Needle U-100 (INSULIN SYRINGE .5CC/31GX5/16") 31G X 5/16" 0.5 ML MISC Please use to draw up insulin as prescribed  100 each  5  . lovastatin (MEVACOR) 20 MG tablet Take 1 tablet (20 mg total) by mouth daily.  30 tablet  11  . metFORMIN (GLUCOPHAGE) 1000 MG tablet Take 1 tablet (1,000 mg total) by mouth 2 (two) times daily with a meal.  90 tablet  3  . omeprazole (PRILOSEC) 40 MG capsule Take 1 capsule (40 mg total) by mouth daily.  90 capsule  3  . ondansetron (ZOFRAN) 4 MG tablet Take 1 tablet (4 mg total) by mouth every 6 (six) hours.  12 tablet  0  . oxyCODONE-acetaminophen (ROXICET) 5-325 MG per tablet Take 1 tablet by mouth every 6 (six) hours as needed for pain.  30 tablet  0    Allergies: Allergies  Allergen Reactions  . Other Itching and Rash    Wool       Objective:   Physical Exam: Filed Vitals:   03/24/12 1450  BP: 151/95  Pulse: 110  Temp: 98.3 F (36.8 C)    General: Vital signs reviewed and noted. Well-developed, well-nourished, in no acute distress; alert, appropriate and cooperative throughout examination. No accessory muscle use Head: Normocephalic, atraumatic Lungs: Normal respiratory effort. Clear to auscultation BL without crackles or wheezes. Heart: Tachycardic,regular rhythm. S1 and S2 normal without gallop, murmur, or rubs. Abdomen:BS normoactive. Soft, Nondistended, non-tender.  No masses or organomegaly. Extremities: No pretibial edema.     Assessment & Plan:

## 2012-03-25 NOTE — Assessment & Plan Note (Signed)
Her A1c was checked with her last clinic visit about a week ago and was found to be 10.5. Patient did not bring her glucose meter today. She states that she self increased her NovoLog from 15-17 units. With short prednisone taper and also given her recent A1c, I feel safe to increase her NPH insulin to 20 units twice a day from 17 units BID.

## 2012-03-25 NOTE — Assessment & Plan Note (Signed)
BP Readings from Last 3 Encounters:  03/24/12 151/95  03/17/12 123/75  03/02/12 149/92    Lab Results  Component Value Date   NA 140 03/02/2012   K 3.8 03/02/2012   CREATININE 0.90 03/02/2012    Assessment:  Blood pressure control: moderately elevated  Progress toward BP goal:  deteriorated (2/2 acute illness)  Comments: She is moderately elevated secondary to this acute respiratory illness and distress  Plan:  Medications:  continue current medications  Educational resources provided: brochure

## 2012-03-25 NOTE — Assessment & Plan Note (Signed)
Patient presents with cough, shortness of breath , off and on wheezing with increased use of inhalers for last 3 days. On exam today she had normal respiratory effort with no current wheezing and no accessory muscle use. She was noted to be tachycardic likely secondary to increased albuterol usage for last 3 days. She does not have any documented PFTs in the chart, on asking the patient she states that she never got them done here but had done at previous doctors office several years ago. I think this is most likely bronchitis vs mild asthma exacerbation from sinusitis. -Would treat this short course of prednisone taper and antibiotics( Z-Pak) - Steam inhalation and warm saline gargles - Unfortunately without insurance, getting PFT's would be difficult. Would consider that in future once she gets insurance or orange card.

## 2012-03-30 ENCOUNTER — Ambulatory Visit (HOSPITAL_COMMUNITY)
Admission: RE | Admit: 2012-03-30 | Discharge: 2012-03-30 | Disposition: A | Discharge status: Home / Self Care | Payer: Self-pay | Source: Ambulatory Visit | Attending: Internal Medicine | Admitting: Internal Medicine

## 2012-03-30 DIAGNOSIS — Z1231 Encounter for screening mammogram for malignant neoplasm of breast: Secondary | ICD-10-CM

## 2012-04-03 MED ORDER — INSULIN NPH (HUMAN) (ISOPHANE) 100 UNIT/ML ~~LOC~~ SUSP: 20.0000 [IU] | Freq: Two times a day (BID) | SUBCUTANEOUS | Status: DC

## 2012-04-03 NOTE — Addendum Note (Signed)
Addended by: Elyse Jarvis on: 04/03/2012 06:06 PM   Modules accepted: Orders

## 2012-04-13 ENCOUNTER — Telehealth: Payer: Self-pay | Admitting: Dietician

## 2012-04-15 ENCOUNTER — Telehealth: Payer: Self-pay | Admitting: Dietician

## 2012-04-15 NOTE — Telephone Encounter (Signed)
Unable to reach patient.

## 2012-04-15 NOTE — Telephone Encounter (Signed)
Patient called for support: Blood sugar is not coming down sine being sick- lowest in high 200s. Taking 20 units NPH twice daily. Not exercising, weight about the same. Broke toe, no redness.  Checks feet daily. Eating out often. She plans to try low fat, lower carb diet along with walking as much as toe will allow to lower blood sugars.

## 2012-04-16 NOTE — Telephone Encounter (Signed)
Thank you for message. I will call her in next few days to discuss BG, fasting sugars and assess need for increased insulin dose prior to her appt w me 04/28/12.

## 2012-04-20 ENCOUNTER — Telehealth: Payer: Self-pay | Admitting: Internal Medicine

## 2012-04-20 DIAGNOSIS — E119 Type 2 diabetes mellitus without complications: Secondary | ICD-10-CM

## 2012-04-20 MED ORDER — INSULIN NPH (HUMAN) (ISOPHANE) 100 UNIT/ML ~~LOC~~ SUSP: 22.0000 [IU] | Freq: Two times a day (BID) | SUBCUTANEOUS | Status: DC

## 2012-04-20 NOTE — Telephone Encounter (Signed)
Was notified by Norm Parcel of patient call last week for advice regarding hyperglycemia at home. Patient is on 1000 metformin twice a day and 20 units of NPH insulin twice a day. She's noticed that since recent upper respiratory infection. She is noticed since recent upper respiratory infection about a month ago that her blood sugars have been difficult to control. Over the past several weeks, she reports preprandial morning fasting blood glucoses always in the 200s. Bedtime postprandial glucoses 250-300. She does acknowledge that she's not exercising as much at home, but is trying to eat a reasonable diet with a few indiscretions. She denies any blood glucose values less than 200 or any signs or symptoms of hypoglycemia. I'm scheduled to see her in one week's time for an appointment on 04/28/12. In the meantime, I've instructed her to increase her NPH insulin dosing to 22 units twice a day. I've advised her to continue checking her blood sugars and bring meter to visit next week. Insulin hold parameters discussed. Further adjustment to be guided by these values. Patient was able to teach back plan and in agreement with change. Bronson Curb, MD  PGY-1, Internal Medicine Resident Pager: [phone] (7AM-5PM) 04/20/2012, 9:36 AM

## 2012-04-28 ENCOUNTER — Ambulatory Visit (INDEPENDENT_AMBULATORY_CARE_PROVIDER_SITE_OTHER): Payer: Self-pay | Admitting: Internal Medicine

## 2012-04-28 ENCOUNTER — Encounter: Payer: Self-pay | Admitting: Internal Medicine

## 2012-04-28 VITALS — BP 128/87 | HR 95 | Temp 98.6°F | Ht 69.48 in | Wt 173.0 lb

## 2012-04-28 DIAGNOSIS — I10 Essential (primary) hypertension: Secondary | ICD-10-CM

## 2012-04-28 DIAGNOSIS — E119 Type 2 diabetes mellitus without complications: Secondary | ICD-10-CM

## 2012-04-28 DIAGNOSIS — E785 Hyperlipidemia, unspecified: Secondary | ICD-10-CM

## 2012-04-28 LAB — GLUCOSE, CAPILLARY: Glucose-Capillary: 120 mg/dL — ABNORMAL HIGH (ref 70–99)

## 2012-04-28 MED ORDER — INSULIN NPH (HUMAN) (ISOPHANE) 100 UNIT/ML ~~LOC~~ SUSP: 24.0000 [IU] | Freq: Two times a day (BID) | SUBCUTANEOUS | Status: DC

## 2012-04-28 NOTE — Assessment & Plan Note (Signed)
Lab Results  Component Value Date   HGBA1C 10.5 03/17/2012   HGBA1C 9.4 05/09/2009   CREATININE 0.90 03/02/2012   CREATININE 0.82 02/07/2011   MICROALBUR 0.80 01/25/2011   MICRALBCREAT 6.8 01/25/2011   CHOL 171 01/25/2011   HDL 44 01/25/2011   TRIG 123 01/25/2011    Last eye exam and foot exam:    Component Value Date/Time   HMDIABEYEEXA normal 05/28/2008   HMDIABFOOTEX done 06/12/2009    Assessment: Diabetes control: not controlled Progress toward goals: improved Barriers to meeting goals: nonadherence to medications  Plan: Diabetes treatment: Prior to visit on NPH 22U BID and metformin 1000U BID. Will increase NPH to 24U BID. Pt instructed to record blood sugars, fasting and postprandial. will call her next week to reasses and possibly uptitrate if needed. Encouraged to continue diet/exercise. Patient asked if cinnamon supplement helps with DM. Told her no medical literature to support, may have SE. Could not endorse at this time.  Refer to: none Instruction/counseling given: reminded to get eye exam, reminded to bring blood glucose meter & log to each visit, reminded to bring medications to each visit, discussed the need for weight loss and discussed diet Patient to get eye exam when gets insurance in July 2014.  Will start on baby aspirin 81mg  daily for primary stroke prevention

## 2012-04-28 NOTE — Patient Instructions (Addendum)
1. Increase insulin to 24units twice a day.  2. Keep up the good work with your exercise and diet! 3. I will call you next week to discuss how your sugars have been on the increased insulin dose and to let you know the results of your lab tests. 4. Come back and see me in the clinic in 1 month.

## 2012-04-28 NOTE — Assessment & Plan Note (Signed)
Lab Results  Component Value Date   NA 140 03/02/2012   K 3.8 03/02/2012   CL 101 03/02/2012   CO2 26 02/07/2011   BUN 14 03/02/2012   CREATININE 0.90 03/02/2012   CREATININE 0.82 02/07/2011    BP Readings from Last 3 Encounters:  04/28/12 128/87  03/24/12 151/95  03/17/12 123/75    Assessment: Hypertension control:  controlled  Progress toward goals:  at goal Barriers to meeting goals:  no barriers identified  Plan: Hypertension treatment:  continue current medications. May benefit from addition of ACE-inhibitor. Will await results of urine microalbumin:Cr.

## 2012-04-28 NOTE — Progress Notes (Signed)
Patient ID: JIMEKA BALAN, female   DOB: 17-Feb-1946, 67 y.o.   MRN: [id number]  Subjective:   Patient ID: JADZIA IBSEN female   DOB: 03/08/1945 67 y.o.   MRN: [id number]  HPI: Ms.Leisel D Bugay is a 67 y.o. female with past medical history of type 2 diabetes, hypertension, hyperlipidemia and GERD presenting for followup of diabetes. Patient is on 1000 metformin twice a day and 22 units of NPH insulin twice a day. Over the past several weeks, she reports preprandial morning fasting blood glucoses always in the 200s. Bedtime postprandial glucoses 250-300. On 04/20/12, I spoke w patient over the phone and instructed her to increase NPH dose to 22U BID and measure BG. Today she does not have her meter but reports that blood sugars have been between 150-200 fasting, and still 200-250 postprandially. She does report that over the past week she has started back with her exercise plan to walk 3 miles daily and eat healthier.  She denies any blood glucose values less than 200 or any signs or symptoms of hypoglycemia. She denies any changes in vision or tingling and numbness the distal extremities. She has not received annual DM eye exam due to lack of insurance. Not eligible for orange card but says she is Medicare eligible and has applied for enrollment to begin on 67th birthday in July.     Past Medical History  Diagnosis Date  . Asthma   . Diabetes mellitus   . GERD (gastroesophageal reflux disease)   . Hypertension   . Hyperlipidemia    Current Outpatient Prescriptions  Medication Sig Dispense Refill  . albuterol (PROVENTIL HFA) 108 (90 BASE) MCG/ACT inhaler Inhale 2 puffs into the lungs every 4 (four) hours as needed for shortness of breath.  3 each  4  . azithromycin (ZITHROMAX) 250 MG tablet Take 2 tabs by mouth on day 1 followed by 1 day by mouth each day for 4 days.  6 each  0  . cetirizine (ZYRTEC) 10 MG tablet Take 1 tablet (10 mg total) by mouth daily.  30 tablet  4  .  guaiFENesin (ROBITUSSIN) 100 MG/5ML liquid Take 10 mLs (200 mg total) by mouth 3 (three) times daily as needed for cough.  120 mL  0  . guaiFENesin-dextromethorphan (ROBITUSSIN DM) 100-10 MG/5ML syrup Take 5 mLs by mouth 3 (three) times daily as needed for cough.  118 mL  0  . hydrochlorothiazide (HYDRODIURIL) 25 MG tablet Take 1 tablet (25 mg total) by mouth daily.  90 tablet  3  . ibuprofen (ADVIL,MOTRIN) 600 MG tablet Take 1 tablet (600 mg total) by mouth every 6 (six) hours as needed for pain.  60 tablet  0  . insulin NPH (HUMULIN N,NOVOLIN N) 100 UNIT/ML injection Inject 24 Units into the skin 2 (two) times daily.  1 vial  3  . Insulin Pen Needle 32G X 6 MM MISC 1 Device by Does not apply route Nightly.  60 each  11  . Insulin Syringe-Needle U-100 (INSULIN SYRINGE .5CC/31GX5/16") 31G X 5/16" 0.5 ML MISC Please use to draw up insulin as prescribed  100 each  5  . lovastatin (MEVACOR) 20 MG tablet Take 1 tablet (20 mg total) by mouth daily.  30 tablet  11  . metFORMIN (GLUCOPHAGE) 1000 MG tablet Take 1 tablet (1,000 mg total) by mouth 2 (two) times daily with a meal.  90 tablet  3  . omeprazole (PRILOSEC) 40 MG capsule Take 1 capsule (40 mg total)  by mouth daily.  90 capsule  3  . ondansetron (ZOFRAN) 4 MG tablet Take 1 tablet (4 mg total) by mouth every 6 (six) hours.  12 tablet  0  . oxyCODONE-acetaminophen (ROXICET) 5-325 MG per tablet Take 1 tablet by mouth every 6 (six) hours as needed for pain.  30 tablet  0  . predniSONE (DELTASONE) 20 MG tablet Take 2 tabs( 40 mg)  by mouth for 2 days followed by  1 tab( 20 mg ) by mouth for 2 days followed by 1/2 tab by mouth for 2 days.  7 tablet  0   No current facility-administered medications for this visit.   Family History  Problem Relation Age of Onset  . Cancer Mother 63    lUNG CANCER  . Heart disease Father 41   History   Social History  . Marital Status: Married    Spouse Name: N/A    Number of Children: N/A  . Years of Education:  N/A   Social History Main Topics  . Smoking status: Former Games developer  . Smokeless tobacco: None     Comment: stopped in1975  . Alcohol Use: No  . Drug Use: No  . Sexually Active: Yes    Birth Control/ Protection: Surgical   Other Topics Concern  . None   Social History Narrative  . None   Review of Systems: 10 pt ROS performed, pertinent positives and negatives noted in HPI  Objective:  Physical Exam: Filed Vitals:   04/28/12 1519  BP: 128/87  Pulse: 95  Temp: 98.6 F (37 C)  TempSrc: Oral  Height: 5' 9.48" (1.765 m)  Weight: 173 lb (78.472 kg)  SpO2: 95%   Vitals reviewed. General: sitting up in chair smiling, NAD HEENT: PERRL, EOMI, no scleral icterus Cardiac: RRR, no rubs, murmurs or gallops Pulm: clear to auscultation bilaterally, no wheezes, rales, or rhonchi Abd: soft, nontender, nondistended, BS present Ext: warm and well perfused, no pedal edema Neuro: alert and oriented X3, cranial nerves II-XII grossly intact, strength and sensation to light touch equal in bilateral upper and lower extremities   Assessment & Plan:

## 2012-04-28 NOTE — Assessment & Plan Note (Signed)
Lab Results  Component Value Date   CHOL 171 01/25/2011   HDL 44 01/25/2011   LDLCALC 102* 01/25/2011   TRIG 123 01/25/2011   CHOLHDL 3.9 01/25/2011   On pravastatin, tolerated well. Due for lipid panel today. Will check lipids, may uptitrate dose of pravastatin pending results.

## 2012-04-29 LAB — LIPID PANEL
Cholesterol: 145 mg/dL (ref 0–200)
HDL: 45 mg/dL (ref >39)
LDL Cholesterol: 71 mg/dL (ref 0–99)
Total CHOL/HDL Ratio: 3.2 Ratio
Triglycerides: 143 mg/dL (ref <150)
VLDL: 29 mg/dL (ref 0–40)

## 2012-04-29 LAB — MICROALBUMIN / CREATININE URINE RATIO
Creatinine, Urine: 128.3 mg/dL
Microalb Creat Ratio: 4.9 mg/g (ref 0.0–30.0)
Microalb, Ur: 0.63 mg/dL (ref 0.00–1.89)

## 2012-04-29 LAB — BASIC METABOLIC PANEL WITH GFR
BUN: 17 mg/dL (ref 6–23)
CO2: 26 mEq/L (ref 19–32)
Calcium: 10 mg/dL (ref 8.4–10.5)
Chloride: 104 mEq/L (ref 96–112)
Creat: 0.97 mg/dL (ref 0.50–1.10)
GFR, Est African American: 71 mL/min
GFR, Est Non African American: 62 mL/min
Glucose, Bld: 126 mg/dL — ABNORMAL HIGH (ref 70–99)
Potassium: 3.6 mEq/L (ref 3.5–5.3)
Sodium: 144 mEq/L (ref 135–145)

## 2012-05-07 ENCOUNTER — Telehealth: Payer: Self-pay | Admitting: Internal Medicine

## 2012-05-07 DIAGNOSIS — E119 Type 2 diabetes mellitus without complications: Secondary | ICD-10-CM

## 2012-05-07 MED ORDER — INSULIN NPH (HUMAN) (ISOPHANE) 100 UNIT/ML ~~LOC~~ SUSP: 26.0000 [IU] | Freq: Two times a day (BID) | SUBCUTANEOUS | Status: DC

## 2012-05-07 NOTE — Telephone Encounter (Signed)
Called patient to discuss management of hyperglycemia. She reports persistently elevated BG, fastings 200-270, since increasing NPH to 24 U BID. She is being fastidious about her diet. I have asked her to keep a food journal, which she says Norm Parcel has taught her about in the past. Ideally she should be on multi-insulin basal and bolus regimen.  I will increase her to NPH 26 BID for now and call her pharmacy to discuss total cost for mixed insulin therapies for her.  Bronson Curb, MD  05/07/2012, 5:59 PM

## 2012-05-11 ENCOUNTER — Other Ambulatory Visit: Payer: Self-pay | Admitting: Internal Medicine

## 2012-05-12 ENCOUNTER — Other Ambulatory Visit: Payer: Self-pay | Admitting: Internal Medicine

## 2012-05-12 MED ORDER — INSULIN NPH ISOPHANE & REGULAR (70-30) 100 UNIT/ML ~~LOC~~ SUSP: 28.0000 [IU] | Freq: Two times a day (BID) | SUBCUTANEOUS | Status: DC

## 2012-05-12 MED ORDER — "INSULIN SYRINGE-NEEDLE U-100 31G X 5/16"" 0.5 ML MISC": Status: DC

## 2012-05-12 NOTE — Progress Notes (Signed)
Patient says she can now afford $25-28/month for mixed insulin. Sent in Relion 70/30 to her pharmacy. She will start w 28U BID, will increase am and pm dose by 2 U q3 days for fasting am blood glucose >150. Instructed her to take w meals. Hypoglycemia protocol reviewed.

## 2012-05-13 ENCOUNTER — Other Ambulatory Visit: Payer: Self-pay | Admitting: Internal Medicine

## 2012-05-13 DIAGNOSIS — E119 Type 2 diabetes mellitus without complications: Secondary | ICD-10-CM

## 2012-05-13 MED ORDER — METFORMIN HCL 1000 MG PO TABS: 1000.0000 mg | ORAL_TABLET | Freq: Two times a day (BID) | ORAL | Status: DC

## 2012-05-21 ENCOUNTER — Telehealth: Payer: Self-pay | Admitting: Internal Medicine

## 2012-05-21 MED ORDER — INSULIN NPH ISOPHANE & REGULAR (70-30) 100 UNIT/ML ~~LOC~~ SUSP: 32.0000 [IU] | Freq: Two times a day (BID) | SUBCUTANEOUS | Status: DC

## 2012-05-21 NOTE — Telephone Encounter (Signed)
Called to speak with patient regarding glycemic control with recent insulin change. Patient has been taking Novolin 70/30 twice a day for diabetes. She was taking 28 units twice a day and had fasting blood sugars in the 200s so she increased to 30 units twice a day. Her lowest fasting blood sugar on this dose has been 170. No episodes of hypoglycemia. She continues her diet and exercise plan. Will increase to 32 units twice a day. Have discussed parameters for home up titration with patient and she feels confident with plan. We will increase morning and nighttime insulin by 2 units every few days for goal fasting blood sugar less than 130. She will call me with any questions. Hopefully when she gets Medicare in July we can transition her to Lantus and mealtime NovoLog. Now she can only afford the Novolin 70/30. Bronson Curb, MD  PGY-1, Internal Medicine Resident Pager: [phone] (7AM-5PM) 05/21/2012, 2:09 PM

## 2012-05-26 ENCOUNTER — Other Ambulatory Visit: Payer: Self-pay | Admitting: Internal Medicine

## 2012-05-26 ENCOUNTER — Telehealth: Payer: Self-pay | Admitting: *Deleted

## 2012-05-26 MED ORDER — INSULIN NPH ISOPHANE & REGULAR (70-30) 100 UNIT/ML ~~LOC~~ SUSP: 34.0000 [IU] | Freq: Two times a day (BID) | SUBCUTANEOUS | Status: DC

## 2012-05-26 NOTE — Telephone Encounter (Signed)
I called patient. She is having no s/s hypoglycemia. I instructed her to increase to 34U BID and record sugars, call if <70. She is in agreement.  Thank you.

## 2012-05-26 NOTE — Telephone Encounter (Signed)
Pt called to report blood sugars of 160 yesterday and today 190.   She is up to 32 units twice a day and wanted to know if she needs to increase insulin dose.  Pt # [id number]

## 2012-06-25 ENCOUNTER — Other Ambulatory Visit: Payer: Self-pay | Admitting: *Deleted

## 2012-06-25 DIAGNOSIS — E119 Type 2 diabetes mellitus without complications: Secondary | ICD-10-CM

## 2012-06-25 MED ORDER — OMEPRAZOLE 40 MG PO CPDR: 40.0000 mg | DELAYED_RELEASE_CAPSULE | Freq: Every day | ORAL | Status: DC

## 2012-07-29 ENCOUNTER — Telehealth: Payer: Self-pay | Admitting: *Deleted

## 2012-07-29 ENCOUNTER — Encounter (HOSPITAL_COMMUNITY): Payer: Self-pay | Admitting: *Deleted

## 2012-07-29 ENCOUNTER — Emergency Department (INDEPENDENT_AMBULATORY_CARE_PROVIDER_SITE_OTHER)
Admission: EM | Admit: 2012-07-29 | Discharge: 2012-07-29 | Disposition: A | Discharge status: Home / Self Care | Payer: Medicare Other | Source: Home / Self Care

## 2012-07-29 DIAGNOSIS — E1169 Type 2 diabetes mellitus with other specified complication: Secondary | ICD-10-CM

## 2012-07-29 DIAGNOSIS — L02619 Cutaneous abscess of unspecified foot: Secondary | ICD-10-CM

## 2012-07-29 DIAGNOSIS — E11628 Type 2 diabetes mellitus with other skin complications: Secondary | ICD-10-CM

## 2012-07-29 MED ORDER — SULFAMETHOXAZOLE-TRIMETHOPRIM 800-160 MG PO TABS: 1.0000 | ORAL_TABLET | Freq: Two times a day (BID) | ORAL | Status: DC

## 2012-07-29 MED ORDER — CEPHALEXIN 500 MG PO CAPS: 1000.0000 mg | ORAL_CAPSULE | Freq: Two times a day (BID) | ORAL | Status: DC

## 2012-07-29 NOTE — ED Provider Notes (Signed)
History     CSN: [id number]  Arrival date & time 07/29/12  1445   First MD Initiated Contact with Patient 07/29/12 1508      Chief Complaint  Patient presents with  . Foot Pain    (Consider location/radiation/quality/duration/timing/severity/associated sxs/prior treatment) HPI Comments: 62f with history of diabetes and hypertension presents with right foot swelling and erythema that started a few days ago. She has noticed 2 small red dots on her foot she thinks she may have been bitten by a spider. She has also noticed some red streaks going up her leg.  She denies any pain in the foot or any fever, or any other constitutional symptoms. She has never had anything like this happen before. She states she always takes very good care of her feet ever since being diagnosed with diabetes  Patient is a 67 y.o. female presenting with lower extremity pain.  Foot Pain Pertinent negatives include no chest pain, no abdominal pain and no shortness of breath.    Past Medical History  Diagnosis Date  . Asthma   . Diabetes mellitus   . GERD (gastroesophageal reflux disease)   . Hypertension   . Hyperlipidemia     Past Surgical History  Procedure Laterality Date  . Cholecystectomy  2006  . Tubal ligation  1988  . Cesarean section  1988  . Fracture surgery  1992    lEFT ANKLE orif    Family History  Problem Relation Age of Onset  . Cancer Mother 69    lUNG CANCER  . Heart disease Father 60    History  Substance Use Topics  . Smoking status: Former Games developer  . Smokeless tobacco: Not on file     Comment: stopped in1975  . Alcohol Use: No    OB History   Grav Para Term Preterm Abortions TAB SAB Ect Mult Living                  Review of Systems  Constitutional: Negative for fever and chills.  Eyes: Negative for visual disturbance.  Respiratory: Negative for cough and shortness of breath.   Cardiovascular: Negative for chest pain, palpitations and leg swelling.   Gastrointestinal: Negative for nausea, vomiting and abdominal pain.  Endocrine: Negative for polydipsia and polyuria.  Genitourinary: Negative for dysuria, urgency and frequency.  Musculoskeletal: Negative for myalgias and arthralgias.       See HPI  Skin: Negative for rash.  Neurological: Negative for dizziness, weakness and light-headedness.    Allergies  Other  Home Medications   Current Outpatient Rx  Name  Route  Sig  Dispense  Refill  . albuterol (PROVENTIL HFA) 108 (90 BASE) MCG/ACT inhaler   Inhalation   Inhale 2 puffs into the lungs every 4 (four) hours as needed for shortness of breath.   3 each   4   . aspirin 81 MG tablet   Oral   Take 81 mg by mouth daily.         Marland Kitchen azithromycin (ZITHROMAX) 250 MG tablet      Take 2 tabs by mouth on day 1 followed by 1 day by mouth each day for 4 days.   6 each   0   . cephALEXin (KEFLEX) 500 MG capsule   Oral   Take 2 capsules (1,000 mg total) by mouth 2 (two) times daily.   28 capsule   0   . cetirizine (ZYRTEC) 10 MG tablet   Oral   Take 1 tablet (10  mg total) by mouth daily.   30 tablet   4   . guaiFENesin (ROBITUSSIN) 100 MG/5ML liquid   Oral   Take 10 mLs (200 mg total) by mouth 3 (three) times daily as needed for cough.   120 mL   0   . guaiFENesin-dextromethorphan (ROBITUSSIN DM) 100-10 MG/5ML syrup   Oral   Take 5 mLs by mouth 3 (three) times daily as needed for cough.   118 mL   0   . hydrochlorothiazide (HYDRODIURIL) 25 MG tablet   Oral   Take 1 tablet (25 mg total) by mouth daily.   90 tablet   3   . ibuprofen (ADVIL,MOTRIN) 600 MG tablet   Oral   Take 1 tablet (600 mg total) by mouth every 6 (six) hours as needed for pain.   60 tablet   0   . insulin NPH-regular (NOVOLIN 70/30) (70-30) 100 UNIT/ML injection   Subcutaneous   Inject 34 Units into the skin 2 (two) times daily with a meal.   10 mL   12   . Insulin Pen Needle 32G X 6 MM MISC   Does not apply   1 Device by Does not  apply route Nightly.   60 each   11   . Insulin Syringe-Needle U-100 (INSULIN SYRINGE .5CC/31GX5/16") 31G X 5/16" 0.5 ML MISC      Please use to draw up insulin as prescribed   100 each   5   . Insulin Syringe-Needle U-100 (RELION INSULIN SYR 0.5ML/31G) 31G X 5/16" 0.5 ML MISC      You can use this syringe to draw up doses more than 30 units.   100 each   2   . lovastatin (MEVACOR) 20 MG tablet   Oral   Take 1 tablet (20 mg total) by mouth daily.   30 tablet   11   . metFORMIN (GLUCOPHAGE) 1000 MG tablet   Oral   Take 1 tablet (1,000 mg total) by mouth 2 (two) times daily with a meal.   90 tablet   3   . omeprazole (PRILOSEC) 40 MG capsule   Oral   Take 1 capsule (40 mg total) by mouth daily.   90 capsule   3   . ondansetron (ZOFRAN) 4 MG tablet   Oral   Take 1 tablet (4 mg total) by mouth every 6 (six) hours.   12 tablet   0   . oxyCODONE-acetaminophen (ROXICET) 5-325 MG per tablet   Oral   Take 1 tablet by mouth every 6 (six) hours as needed for pain.   30 tablet   0   . predniSONE (DELTASONE) 20 MG tablet      Take 2 tabs( 40 mg)  by mouth for 2 days followed by  1 tab( 20 mg ) by mouth for 2 days followed by 1/2 tab by mouth for 2 days.   7 tablet   0   . sulfamethoxazole-trimethoprim (SEPTRA DS) 800-160 MG per tablet   Oral   Take 1 tablet by mouth every 12 (twelve) hours.   14 tablet   0     BP 155/86  Pulse 101  Temp(Src) 99.4 F (37.4 C) (Oral)  Resp 19  SpO2 95%  Physical Exam  Nursing note and vitals reviewed. Constitutional: She is oriented to person, place, and time. Vital signs are normal. She appears well-developed and well-nourished. No distress.  HENT:  Head: Atraumatic.  Eyes: EOM are normal. Pupils are equal,  round, and reactive to light.  Cardiovascular: Normal rate, regular rhythm, normal heart sounds and normal pulses.  Exam reveals no gallop and no friction rub.   No murmur heard. Pulmonary/Chest: Effort normal and  breath sounds normal. No respiratory distress. She has no wheezes. She has no rales.  Abdominal: Soft. There is no tenderness.  Musculoskeletal:       Right ankle: She exhibits swelling (mild erythema, warmth, swelling). She exhibits normal range of motion and normal pulse. No tenderness.  No pain or tenderness in the foot or calf, no palpable cords in the calf, no pain in the calf or foot with passive dorsi flexion  Neurological: She is alert and oriented to person, place, and time. She has normal strength.  Skin: Skin is warm and dry. She is not diaphoretic.  Psychiatric: She has a normal mood and affect. Her behavior is normal. Judgment normal.    ED Course  Procedures (including critical care time)  Labs Reviewed - No data to display No results found.   1. Cellulitis in diabetic foot       MDM  This patient has a mild early cellulitis in her right foot. Will treat with oral antibiotics and she will keep a very close on it. If this worsens, she will return for reevaluation. She will follow up in one week before completing the antibiotic regimen for reevaluation    Meds ordered this encounter  Medications  . sulfamethoxazole-trimethoprim (SEPTRA DS) 800-160 MG per tablet    Sig: Take 1 tablet by mouth every 12 (twelve) hours.    Dispense:  14 tablet    Refill:  0  . cephALEXin (KEFLEX) 500 MG capsule    Sig: Take 2 capsules (1,000 mg total) by mouth 2 (two) times daily.    Dispense:  28 capsule    Refill:  0         Graylon Good, PA-C 07/29/12 1557

## 2012-07-29 NOTE — Telephone Encounter (Signed)
Pt sttes 2 nights ago she felt like she was bit to rt foot. Pt called with c/o swelling and itching to rt foot. Small red mark on ankle where itching is.  She states she has long red marks up here leg, she is not sure if it's from scratching. She has used antibiotic cream which helps the itching. Denies fever, nausea,   We don't have any appointments today.  Pt advised to go to [facility] for evaluation.  She agrees

## 2012-07-29 NOTE — ED Notes (Signed)
Pt reports  sev  Days  Ago  She  Awoke  With  Itching   To  r  Foot  She  Noticed  A  sm  Mark  She  Thought it  Perhaps  Was  An insect  Bite   -  She is  A  Diabetic She  Than noticed  Increase  In    Swelling  With  Redness  Present       She  Reports it  Feels  Tight   There  Also  Appears  Some  Streaks  Running up r  Leg  As  Well

## 2012-07-29 NOTE — Telephone Encounter (Signed)
Agree, thanks

## 2012-07-31 ENCOUNTER — Telehealth: Payer: Self-pay | Admitting: Internal Medicine

## 2012-07-31 NOTE — Telephone Encounter (Signed)
Patient called at about 2:00 PM and reported having pain behind her left ear. Patient reports that she has electric shock like feeling behind her left year. It started 2 days ago. The pain is intermittent. It lasts for a few seconds when it happens each time. There is no pain from her ear itself. She has not had any viral infection recently. No neck pain or stiffness. No neurologic deficit. No nausea or vomiting. No changes in her vision and hearing. No recent trauma. Actually, I saw patient in clinic in 08/2011 when she had similar pain over the right side of head behind ear. She had negative CT-head and ESR of 34 in last year. She was diagnosed as having occipital neuralgia. She was treated with pain medication and her pain resolved. Today, she feels like that her pain is very similar to that of last year.   Assessment and Plan:  - Her pain is likely due to occipital neuralgia. Will treat with Ibuprofen. Patent has already obtained OTC ibuprofen. - It is important to r/o temporal arteritis. Currently her symptoms are mild. Patient was instructed to come to clinic in next Monday for further evaluation, such as checking ESR. Patient is instructed to come to ED immediately if she develops vision disturbance or worsening headache. She verbally understood and agreed to do so.  Lorretta Harp, MD PGY2, Internal Medicine Teaching Service Pager: [phone]

## 2012-08-03 ENCOUNTER — Telehealth: Payer: Self-pay | Admitting: *Deleted

## 2012-08-06 ENCOUNTER — Other Ambulatory Visit: Payer: Self-pay | Admitting: Internal Medicine

## 2012-08-06 ENCOUNTER — Ambulatory Visit (INDEPENDENT_AMBULATORY_CARE_PROVIDER_SITE_OTHER): Payer: Self-pay | Admitting: Internal Medicine

## 2012-08-06 ENCOUNTER — Encounter: Payer: Self-pay | Admitting: Internal Medicine

## 2012-08-06 VITALS — BP 132/85 | HR 80 | Temp 97.1°F | Ht 67.0 in | Wt 177.6 lb

## 2012-08-06 DIAGNOSIS — E785 Hyperlipidemia, unspecified: Secondary | ICD-10-CM

## 2012-08-06 DIAGNOSIS — E119 Type 2 diabetes mellitus without complications: Secondary | ICD-10-CM

## 2012-08-06 DIAGNOSIS — R51 Headache: Secondary | ICD-10-CM

## 2012-08-06 DIAGNOSIS — I10 Essential (primary) hypertension: Secondary | ICD-10-CM

## 2012-08-06 DIAGNOSIS — M5481 Occipital neuralgia: Secondary | ICD-10-CM | POA: Insufficient documentation

## 2012-08-06 DIAGNOSIS — M531 Cervicobrachial syndrome: Secondary | ICD-10-CM

## 2012-08-06 LAB — GLUCOSE, CAPILLARY: Glucose-Capillary: 163 mg/dL — ABNORMAL HIGH (ref 70–99)

## 2012-08-06 LAB — POCT GLYCOSYLATED HEMOGLOBIN (HGB A1C): Hemoglobin A1C: 8.2

## 2012-08-06 MED ORDER — INSULIN NPH ISOPHANE & REGULAR (70-30) 100 UNIT/ML ~~LOC~~ SUSP: 36.0000 [IU] | Freq: Two times a day (BID) | SUBCUTANEOUS | Status: DC

## 2012-08-06 MED ORDER — INSULIN NPH ISOPHANE & REGULAR (70-30) 100 UNIT/ML ~~LOC~~ SUSP: 35.0000 [IU] | Freq: Two times a day (BID) | SUBCUTANEOUS | Status: DC

## 2012-08-06 NOTE — Patient Instructions (Addendum)
Increase your insulin to 35 units twice a day.     Occipital Neuralgia Neuralgias are attacks of sharp stabbing pain. They may be intermittent (comes and goes) or constant in nature. They may be brief attacks that last seconds to minutes and may come back for days to weeks. The neuralgias can occur as a result of a herpes zoster (shingles), chickenpox infection, or even following a herpes simplex infection (cold sore). TYPES OF NEURALGIA  When these pains are located in the back of the head and neck they are called occipital neuralgias.  When the pain is located between ribs it is called intercostal neuralgia.  When the pain is located in the face it is called trigeminal neuralgia. This is the most common neuralgia. It causes sharp, shock like pain on one side of your face. The neuralgias, which follow herpes zoster infections, often produce a constant burning pain. They may last from weeks to months and even years. The attacks of pain may come from injury or inflammation (irritation) to a nerve. Often the cause is unknown. The episodes of pain may be caused by light touch, movement, or even eating and sneezing. Usually these neuralgias occur after age 36. The neuralgias following shingles and trigeminal neuralgia are the most common. Although painful, these episodes do not threaten life and tend to lessen as we grow older. TREATMENT  There are many medications that may be helpful in the treatment of this disorder. Sometimes several medications may have to be tried before the right combination can be found for you. Some of these medications are:  Only take over-the-counter or prescription medications for pain, discomfort, or fever as directed by your caregiver.  Narcotic medications may be used to control the pain.  Antidepressants and medications used in epilepsy (seizure disorders) may be useful. LET YOUR CAREGIVER KNOW ABOUT:  If you do not obtain relief from medications.  Problems  that are getting worse rather than better.  Troubling side effects that you think are coming from the medication. Do not be discouraged if you do not obtain instant relief from the medications or help given you. Your caregiver can help you get through these episodes of pain with some persistence (continued trying) on your part also. Document Released: 01/29/2001 Document Revised: 04/29/2011 Document Reviewed: 02/04/2005 [facility] Patient Information 2014 [place], [place].    Treatment Goals:  Goals (1 Years of Data) as of 08/06/12         As of Today 07/29/12 04/28/12 03/24/12 03/17/12     Blood Pressure    . Blood Pressure < 140/90  132/85 155/86 128/87 151/95 123/75     Lifestyle    . Plan meals- 50-60 grams carb for meals 40-45 for snack           Result Component    . HEMOGLOBIN A1C < 7.0  8.2    10.5    . LDL CALC < 100    71        Progress Toward Treatment Goals:  Treatment Goal 08/06/2012  Hemoglobin A1C improved  Blood pressure at goal  Prevent falls unable to assess    Self Care Goals & Plans:  Self Care Goal 08/06/2012  Manage my medications take my medicines as prescribed; bring my medications to every visit; refill my medications on time  Monitor my health keep track of my blood glucose; bring my glucose meter and log to each visit  Eat healthy foods drink diet soda or water instead of juice or  soda; eat more vegetables; eat foods that are low in salt; eat baked foods instead of fried foods    Home Blood Glucose Monitoring 03/24/2012  Check my blood sugar 3 times a day  When to check my blood sugar before meals     Care Management & Community Referrals:  Referral 03/24/2012  Referrals made for care management support none needed

## 2012-08-06 NOTE — Assessment & Plan Note (Addendum)
Description of pain c/w occipital neuralgia. No other focal neurological deficits. No visual changes and location not consistent w temporal arteritis.  - Recommend heat, antiinflammatory - warning s/s discussed, return parameters discussed.

## 2012-08-06 NOTE — Assessment & Plan Note (Signed)
Lab Results  Component Value Date   HGBA1C 8.2 08/06/2012   HGBA1C 10.5 03/17/2012   HGBA1C 10.0 09/18/2011     Assessment: Diabetes control: fair control Progress toward A1C goal:  improved Comments: Novolin 70/30 34U BID, metformin 1000 BID  Plan: Medications:  Increase novolin 70/30 top 35U BID. Continue metformin Instruction/counseling given: reminded to bring blood glucose meter & log to each visit Educational resources provided: brochure Self management tools provided: copy of home glucose meter download  Other plans: Will need retinal scan ordered at next visit

## 2012-08-06 NOTE — Progress Notes (Signed)
Patient ID: Alyssa Haynes, female   DOB: 23-Feb-1945, 68 y.o.   MRN: [id number]  Subjective:   Patient ID: Alyssa Haynes female   DOB: 1945-12-06 67 y.o.   MRN: [id number]  HPI: Alyssa Haynes is a 67 y.o. female with past medical history of type 2 diabetes, hypertension, hyperlipidemia and GERD presenting for followup of diabetes and HA.  In terms of her HA, one week ago she called the on-call resident and reported "electrick shock-like" pain behind over her R parietoccipital scalp, intermittent in nature with no other associated sx (visual change, numbness, neck pain/stiffness). She had previously been seen in the clinic with similar complaint 1 week prior. At that time diagnosed with occipital neuralgia. She took motrin which relieved her symptoms. One more episode last week which was mild and self-limited. Again, no visual changes, numbness/tingling/weakness.  Since her last clinic appt, has had an ED visit for cellulitis of R foot, treated w bactrim and keflex. Symptoms have completely resolved.  Patient is on 1000 metformin twice a day Novolin 70/30 34U BID. Meter interrogation shows am fasting sugars 120-180, nighttime 100-150. A1c improved from 10.2 to 8.2.  She denies any blood glucose values less than 70 or any signs or symptoms of hypoglycemia.  She denies any changes in vision or tingling and numbness the distal extremities.  She is due for DM eye exam. Not eligible for orange card but says she is Medicare eligible and has applied for enrollment to begin on 67th birthday in July.    Past Medical History  Diagnosis Date  . Asthma   . Diabetes mellitus   . GERD (gastroesophageal reflux disease)   . Hypertension   . Hyperlipidemia    Current Outpatient Prescriptions  Medication Sig Dispense Refill  . albuterol (PROVENTIL HFA) 108 (90 BASE) MCG/ACT inhaler Inhale 2 puffs into the lungs every 4 (four) hours as needed for shortness of breath.  3 each  4  . aspirin 81 MG  tablet Take 81 mg by mouth daily.      Marland Kitchen azithromycin (ZITHROMAX) 250 MG tablet Take 2 tabs by mouth on day 1 followed by 1 day by mouth each day for 4 days.  6 each  0  . cephALEXin (KEFLEX) 500 MG capsule Take 2 capsules (1,000 mg total) by mouth 2 (two) times daily.  28 capsule  0  . cetirizine (ZYRTEC) 10 MG tablet Take 1 tablet (10 mg total) by mouth daily.  30 tablet  4  . guaiFENesin (ROBITUSSIN) 100 MG/5ML liquid Take 10 mLs (200 mg total) by mouth 3 (three) times daily as needed for cough.  120 mL  0  . guaiFENesin-dextromethorphan (ROBITUSSIN DM) 100-10 MG/5ML syrup Take 5 mLs by mouth 3 (three) times daily as needed for cough.  118 mL  0  . hydrochlorothiazide (HYDRODIURIL) 25 MG tablet Take 1 tablet (25 mg total) by mouth daily.  90 tablet  3  . ibuprofen (ADVIL,MOTRIN) 600 MG tablet Take 1 tablet (600 mg total) by mouth every 6 (six) hours as needed for pain.  60 tablet  0  . insulin NPH-regular (NOVOLIN 70/30) (70-30) 100 UNIT/ML injection Inject 34 Units into the skin 2 (two) times daily with a meal.  10 mL  12  . Insulin Pen Needle 32G X 6 MM MISC 1 Device by Does not apply route Nightly.  60 each  11  . Insulin Syringe-Needle U-100 (INSULIN SYRINGE .5CC/31GX5/16") 31G X 5/16" 0.5 ML MISC Please use to draw  up insulin as prescribed  100 each  5  . Insulin Syringe-Needle U-100 (RELION INSULIN SYR 0.5ML/31G) 31G X 5/16" 0.5 ML MISC You can use this syringe to draw up doses more than 30 units.  100 each  2  . lovastatin (MEVACOR) 20 MG tablet Take 1 tablet (20 mg total) by mouth daily.  30 tablet  11  . metFORMIN (GLUCOPHAGE) 1000 MG tablet Take 1 tablet (1,000 mg total) by mouth 2 (two) times daily with a meal.  90 tablet  3  . omeprazole (PRILOSEC) 40 MG capsule Take 1 capsule (40 mg total) by mouth daily.  90 capsule  3  . ondansetron (ZOFRAN) 4 MG tablet Take 1 tablet (4 mg total) by mouth every 6 (six) hours.  12 tablet  0  . oxyCODONE-acetaminophen (ROXICET) 5-325 MG per tablet  Take 1 tablet by mouth every 6 (six) hours as needed for pain.  30 tablet  0  . predniSONE (DELTASONE) 20 MG tablet Take 2 tabs( 40 mg)  by mouth for 2 days followed by  1 tab( 20 mg ) by mouth for 2 days followed by 1/2 tab by mouth for 2 days.  7 tablet  0  . sulfamethoxazole-trimethoprim (SEPTRA DS) 800-160 MG per tablet Take 1 tablet by mouth every 12 (twelve) hours.  14 tablet  0   No current facility-administered medications for this visit.   Family History  Problem Relation Age of Onset  . Cancer Mother 43    lUNG CANCER  . Heart disease Father 64   History   Social History  . Marital Status: Married    Spouse Name: N/A    Number of Children: N/A  . Years of Education: N/A   Social History Main Topics  . Smoking status: Former Games developer  . Smokeless tobacco: Not on file     Comment: stopped in1975  . Alcohol Use: No  . Drug Use: No  . Sexually Active: Yes    Birth Control/ Protection: Surgical   Other Topics Concern  . Not on file   Social History Narrative  . No narrative on file   Review of Systems: 10 pt ROS performed, pertinent positives and negatives noted in HPI Objective:  Physical Exam: Filed Vitals:   08/06/12 0815  BP: 132/85  Pulse: 80  Temp: 97.1 F (36.2 C)  TempSrc: Oral  Height: 5\' 7"  (1.702 m)  Weight: 177 lb 9.6 oz (80.559 kg)  SpO2: 99%   General: sitting in chair smiling, NAD  HEENT: EOMI, no scleral icterus  Cardiac: RRR, no rubs, murmurs or gallops  Pulm: clear to auscultation bilaterally, no wheezes, rales, or rhonchi  Abd: soft, nontender, nondistended, BS present  Ext: warm and well perfused, no erythema/edema/blisters Neuro: alert and oriented X3, cranial nerves II-XII grossly intact, strength and sensation to light touch equal in bilateral upper and lower extremities  Assessment & Plan:   Please see problem-based charting for assessment and plan.

## 2012-08-06 NOTE — Progress Notes (Signed)
Case discussed with Dr. Ziemer at the time of the visit.  We reviewed the resident's history and exam and pertinent patient test results.  I agree with the assessment, diagnosis, and plan of care documented in the resident's note.  

## 2012-08-06 NOTE — Assessment & Plan Note (Signed)
Lab Results  Component Value Date   CHOL 145 04/28/2012   HDL 45 04/28/2012   LDLCALC 71 04/28/2012   TRIG 143 04/28/2012   CHOLHDL 3.2 04/28/2012   Continue lovastatin 20mg  daily.

## 2012-08-06 NOTE — Assessment & Plan Note (Signed)
BP Readings from Last 3 Encounters:  08/06/12 132/85  07/29/12 155/86  04/28/12 128/87    Lab Results  Component Value Date   NA 144 04/28/2012   K 3.6 04/28/2012   CREATININE 0.97 04/28/2012    Assessment: Blood pressure control: controlled Progress toward BP goal:  at goal Comments: On HCTZ 25mg  qd  Plan: Medications:  continue current medications Educational resources provided: brochure

## 2012-08-18 ENCOUNTER — Encounter: Payer: Medicare Other | Admitting: Internal Medicine

## 2012-08-27 ENCOUNTER — Other Ambulatory Visit: Payer: Self-pay

## 2012-09-01 NOTE — Telephone Encounter (Signed)
review 

## 2012-10-28 ENCOUNTER — Encounter: Payer: Self-pay | Admitting: Dietician

## 2012-10-28 ENCOUNTER — Ambulatory Visit: Payer: Self-pay | Admitting: Internal Medicine

## 2013-01-05 ENCOUNTER — Other Ambulatory Visit: Payer: Self-pay | Admitting: *Deleted

## 2013-01-05 DIAGNOSIS — I10 Essential (primary) hypertension: Secondary | ICD-10-CM

## 2013-01-05 DIAGNOSIS — E785 Hyperlipidemia, unspecified: Secondary | ICD-10-CM

## 2013-01-05 MED ORDER — PRAVASTATIN SODIUM 20 MG PO TABS: 20.0000 mg | ORAL_TABLET | Freq: Every day | ORAL | Status: DC

## 2013-01-05 MED ORDER — HYDROCHLOROTHIAZIDE 25 MG PO TABS: 25.0000 mg | ORAL_TABLET | Freq: Every day | ORAL | Status: DC

## 2013-03-03 ENCOUNTER — Other Ambulatory Visit (HOSPITAL_COMMUNITY): Payer: Self-pay | Admitting: Internal Medicine

## 2013-03-03 DIAGNOSIS — Z1231 Encounter for screening mammogram for malignant neoplasm of breast: Secondary | ICD-10-CM

## 2013-03-31 ENCOUNTER — Ambulatory Visit (HOSPITAL_COMMUNITY)
Admission: RE | Admit: 2013-03-31 | Discharge: 2013-03-31 | Disposition: A | Discharge status: Home / Self Care | Payer: Medicare Other | Source: Ambulatory Visit | Attending: Internal Medicine | Admitting: Internal Medicine

## 2013-03-31 DIAGNOSIS — Z1231 Encounter for screening mammogram for malignant neoplasm of breast: Secondary | ICD-10-CM | POA: Insufficient documentation

## 2013-11-30 ENCOUNTER — Ambulatory Visit
Admission: RE | Admit: 2013-11-30 | Discharge: 2013-11-30 | Disposition: A | Discharge status: Home / Self Care | Payer: Medicare Other | Source: Ambulatory Visit | Attending: Family | Admitting: Family

## 2013-11-30 ENCOUNTER — Other Ambulatory Visit: Payer: Self-pay | Admitting: Family

## 2013-11-30 DIAGNOSIS — M25551 Pain in right hip: Secondary | ICD-10-CM

## 2013-12-13 ENCOUNTER — Other Ambulatory Visit: Payer: Self-pay | Admitting: Family Medicine

## 2013-12-13 DIAGNOSIS — R0989 Other specified symptoms and signs involving the circulatory and respiratory systems: Secondary | ICD-10-CM

## 2013-12-15 ENCOUNTER — Other Ambulatory Visit: Payer: Medicare Other

## 2013-12-17 ENCOUNTER — Ambulatory Visit
Admission: RE | Admit: 2013-12-17 | Discharge: 2013-12-17 | Disposition: A | Discharge status: Home / Self Care | Payer: Medicare Other | Source: Ambulatory Visit | Attending: Family Medicine | Admitting: Family Medicine

## 2013-12-17 DIAGNOSIS — R0989 Other specified symptoms and signs involving the circulatory and respiratory systems: Secondary | ICD-10-CM

## 2014-01-18 ENCOUNTER — Other Ambulatory Visit: Payer: Self-pay | Admitting: Internal Medicine

## 2014-01-25 ENCOUNTER — Other Ambulatory Visit: Payer: Self-pay | Admitting: Internal Medicine

## 2014-02-01 ENCOUNTER — Other Ambulatory Visit: Payer: Self-pay

## 2014-02-01 DIAGNOSIS — I83813 Varicose veins of bilateral lower extremities with pain: Secondary | ICD-10-CM

## 2014-03-03 ENCOUNTER — Encounter: Payer: Self-pay | Admitting: Vascular Surgery

## 2014-03-04 ENCOUNTER — Ambulatory Visit (INDEPENDENT_AMBULATORY_CARE_PROVIDER_SITE_OTHER): Payer: Medicare Other | Admitting: Vascular Surgery

## 2014-03-04 ENCOUNTER — Encounter: Payer: Self-pay | Admitting: Vascular Surgery

## 2014-03-04 ENCOUNTER — Ambulatory Visit (HOSPITAL_COMMUNITY)
Admission: RE | Admit: 2014-03-04 | Discharge: 2014-03-04 | Disposition: A | Discharge status: Home / Self Care | Payer: Medicare Other | Source: Ambulatory Visit | Attending: Vascular Surgery | Admitting: Vascular Surgery

## 2014-03-04 VITALS — BP 124/79 | HR 76 | Ht 67.0 in | Wt 204.4 lb

## 2014-03-04 DIAGNOSIS — I83813 Varicose veins of bilateral lower extremities with pain: Secondary | ICD-10-CM | POA: Diagnosis not present

## 2014-03-04 DIAGNOSIS — I872 Venous insufficiency (chronic) (peripheral): Secondary | ICD-10-CM | POA: Insufficient documentation

## 2014-03-04 NOTE — Progress Notes (Addendum)
Referred by:  Antony Blackbird, MD [place], [place] [address]  Reason for referral: Painful B legs  History of Present Illness  Alyssa Haynes is a 69 y.o. (1945/07/12) female who presents with chief complaint: painful B leg.  Patient notes, pain in both legs over the last few months that she associates with mild-moderate burning in sensation in bulges in her lower legs.  She denies intermittent claudication or rest pain.  The patient has had no history of DVT, known history of pregnancy, known history of varicose vein, no history of venous stasis ulcers, no history of  Lymphedema and no history of skin changes in lower legs.  There is no family history of venous disorders.  The patient has never used compression stockings in the past.  Past Medical History  Diagnosis Date  . Asthma   . Diabetes mellitus   . GERD (gastroesophageal reflux disease)   . Hypertension   . Hyperlipidemia     Past Surgical History  Procedure Laterality Date  . Cholecystectomy  2006  . Tubal ligation  1988  . Cesarean section  1988  . Fracture surgery  1992    lEFT ANKLE orif    History   Social History  . Marital Status: Married    Spouse Name: N/A    Number of Children: N/A  . Years of Education: N/A   Occupational History  . Not on file.   Social History Main Topics  . Smoking status: Former Smoker    Quit date: 02/19/1995  . Smokeless tobacco: Not on file     Comment: stopped in1975  . Alcohol Use: No  . Drug Use: No  . Sexual Activity: Yes    Birth Control/ Protection: Surgical   Other Topics Concern  . Not on file   Social History Narrative    Family History  Problem Relation Age of Onset  . Cancer Mother 24    lUNG CANCER  . Heart disease Father 43  . Heart attack Father      Current Outpatient Prescriptions on File Prior to Visit  Medication Sig Dispense Refill  . albuterol (PROVENTIL HFA) 108 (90 BASE) MCG/ACT inhaler Inhale 2 puffs into  the lungs every 4 (four) hours as needed for shortness of breath. 3 each 4  . aspirin 81 MG tablet Take 81 mg by mouth daily. Pt states she does not take this medication daily, she takes it about once every three days    . hydrochlorothiazide (HYDRODIURIL) 25 MG tablet Take 1 tablet (25 mg total) by mouth daily. 90 tablet 4  . ibuprofen (ADVIL,MOTRIN) 600 MG tablet Take 1 tablet (600 mg total) by mouth every 6 (six) hours as needed for pain. 60 tablet 0  . insulin NPH-regular (NOVOLIN 70/30) (70-30) 100 UNIT/ML injection Inject 35 Units into the skin 2 (two) times daily with a meal. 10 mL 12  . Insulin Syringe-Needle U-100 (INSULIN SYRINGE .5CC/31GX5/16") 31G X 5/16" 0.5 ML MISC Please use to draw up insulin as prescribed 100 each 5  . Insulin Syringe-Needle U-100 (RELION INSULIN SYR 0.5ML/31G) 31G X 5/16" 0.5 ML MISC You can use this syringe to draw up doses more than 30 units. 100 each 2  . metFORMIN (GLUCOPHAGE) 1000 MG tablet Take 1 tablet (1,000 mg total) by mouth 2 (two) times daily with a meal. 90 tablet 3  . pravastatin (PRAVACHOL) 20 MG tablet Take 1 tablet (20 mg total) by mouth at bedtime. [place]  tablet 4  . azithromycin (ZITHROMAX) 250 MG tablet Take 2 tabs by mouth on day 1 followed by 1 day by mouth each day for 4 days. (Patient not taking: Reported on 03/04/2014) 6 each 0  . cetirizine (ZYRTEC) 10 MG tablet Take 1 tablet (10 mg total) by mouth daily. (Patient not taking: Reported on 03/04/2014) 30 tablet 4  . Insulin Pen Needle 32G X 6 MM MISC 1 Device by Does not apply route Nightly. (Patient not taking: Reported on 03/04/2014) 60 each 11  . lovastatin (MEVACOR) 20 MG tablet Take 1 tablet (20 mg total) by mouth daily. 30 tablet 11  . omeprazole (PRILOSEC) 40 MG capsule Take 1 capsule (40 mg total) by mouth daily. (Patient not taking: Reported on 03/04/2014) 90 capsule 3  . ondansetron (ZOFRAN) 4 MG tablet Take 1 tablet (4 mg total) by mouth every 6 (six) hours. (Patient not taking: Reported on  03/04/2014) 12 tablet 0   No current facility-administered medications on file prior to visit.    Allergies  Allergen Reactions  . Other Itching and Rash    Wool      REVIEW OF SYSTEMS:  (Positives checked otherwise negative)  CARDIOVASCULAR:  []  chest pain, []  chest pressure, []  palpitations, []  shortness of breath when laying flat, []  shortness of breath with exertion,  [x]  pain in feet when walking, []  pain in feet when laying flat, []  history of blood clot in veins (DVT), []  history of phlebitis, [x]  swelling in legs, []  varicose veins  PULMONARY:  []  productive cough, []  asthma, []  wheezing  NEUROLOGIC:  []  weakness in arms or legs, []  numbness in arms or legs, []  difficulty speaking or slurred speech, []  temporary loss of vision in one eye, []  dizziness  HEMATOLOGIC:  []  bleeding problems, []  problems with blood clotting too easily  MUSCULOSKEL:  []  joint pain, []  joint swelling  GASTROINTEST:  []  vomiting blood, []  blood in stool     GENITOURINARY:  []  burning with urination, []  blood in urine  PSYCHIATRIC:  []  history of major depression  INTEGUMENTARY:  []  rashes, []  ulcers  CONSTITUTIONAL:  []  fever, []  chills   Physical Examination Filed Vitals:   03/04/14 1033  BP: 124/79  Pulse: 76  Height: 5\' 7"  (1.702 m)  Weight: 204 lb 6.4 oz (92.715 kg)  SpO2: 98%   Body mass index is 32.01 kg/(m^2).  General: A&O x 3, WDWN  Head: [place]/AT  Ear/Nose/Throat: Hearing grossly intact, nares w/o erythema or drainage, oropharynx w/o Erythema/Exudate  Eyes: PERRLA, EOMI  Neck: Supple, no nuchal rigidity, no palpable LAD  Pulmonary: Sym exp, good air movt, CTAB, no rales, rhonchi, & wheezing  Cardiac: RRR, Nl S1, S2, no Murmurs, rubs or gallops  Vascular: Vessel Right Left  Radial Palpable Palpable  Brachial Palpable Palpable  Carotid Palpable, without bruit Palpable, without bruit  Aorta Not palpable N/A  Femoral Palpable Palpable  Popliteal Not palpable Not  palpable  PT Palpable Palpable  DP Palpable Palpable   Gastrointestinal: soft, NTND, -G/R, - HSM, - masses, - CVAT B  Musculoskeletal: M/S 5/5 throughout , Extremities without ischemic changes , trace edema, small varicosities,  No LDS  Neurologic: CN 2-12 intact , Pain and light touch intact in extremities , Motor exam as listed above  Psychiatric: Judgment intact, Mood & affect appropriate for pt's clinical situation  Dermatologic: See M/S exam for extremity exam, no rashes otherwise noted  Lymph : No Cervical, Axillary, or Inguinal lymphadenopathy  Non-Invasive Vascular Imaging  BLE Venous Insufficiency Duplex (Date: 03/04/2014):   RLE: no DVT and SVT, + GSV reflux: R calf, +SSV reflux, + deep venous reflux: CFV  LLE: no DVT and SVT, no GSV reflux, + deep venous reflux:CFV, PV  Outside Studies/Documentation 4 pages of outside documents were reviewed including: outpatient PCP chart.  Medical Decision Making  Alyssa Haynes is a 69 y.o. female who presents with: BLE chronic venous insufficiency (C2), no evidence of PAD   Her sx are not c/w PAD or CVI.  Her exam is consistent with lesser severity of CVI.  Based on the patient's history and examination, I recommend: compressive therapy.  I discussed with the patient the use of her 20-30 mm thigh high compression stockings.  I doubt she would benefit from any ablation or stripping at this point.  Thank you for allowing [place] to participate in this patient's care.  Adele Barthel, MD [facility] Office: [phone] Pager: [phone]  03/04/2014, 11:40 AM

## 2014-03-04 NOTE — Addendum Note (Signed)
Addended by: Conrad [place] on: 03/04/2014 11:46 AM   Modules accepted: Level of Service

## 2014-03-14 ENCOUNTER — Other Ambulatory Visit: Payer: Self-pay | Admitting: Internal Medicine

## 2014-04-11 DIAGNOSIS — E876 Hypokalemia: Secondary | ICD-10-CM | POA: Diagnosis not present

## 2014-04-11 DIAGNOSIS — E785 Hyperlipidemia, unspecified: Secondary | ICD-10-CM | POA: Diagnosis not present

## 2014-04-11 DIAGNOSIS — R0989 Other specified symptoms and signs involving the circulatory and respiratory systems: Secondary | ICD-10-CM | POA: Diagnosis not present

## 2014-04-11 DIAGNOSIS — E119 Type 2 diabetes mellitus without complications: Secondary | ICD-10-CM | POA: Diagnosis not present

## 2014-04-11 DIAGNOSIS — I10 Essential (primary) hypertension: Secondary | ICD-10-CM | POA: Diagnosis not present

## 2014-04-12 DIAGNOSIS — E785 Hyperlipidemia, unspecified: Secondary | ICD-10-CM | POA: Diagnosis not present

## 2014-04-12 DIAGNOSIS — E876 Hypokalemia: Secondary | ICD-10-CM | POA: Diagnosis not present

## 2014-04-12 DIAGNOSIS — I10 Essential (primary) hypertension: Secondary | ICD-10-CM | POA: Diagnosis not present

## 2014-04-12 DIAGNOSIS — Z79899 Other long term (current) drug therapy: Secondary | ICD-10-CM | POA: Diagnosis not present

## 2014-04-12 DIAGNOSIS — E119 Type 2 diabetes mellitus without complications: Secondary | ICD-10-CM | POA: Diagnosis not present

## 2014-04-20 DIAGNOSIS — M7062 Trochanteric bursitis, left hip: Secondary | ICD-10-CM | POA: Diagnosis not present

## 2014-04-26 ENCOUNTER — Emergency Department (HOSPITAL_COMMUNITY)
Admission: EM | Admit: 2014-04-26 | Discharge: 2014-04-26 | Disposition: A | Discharge status: Home / Self Care | Payer: Medicare Other | Attending: Emergency Medicine | Admitting: Emergency Medicine

## 2014-04-26 ENCOUNTER — Encounter (HOSPITAL_COMMUNITY): Payer: Self-pay

## 2014-04-26 DIAGNOSIS — E785 Hyperlipidemia, unspecified: Secondary | ICD-10-CM | POA: Diagnosis not present

## 2014-04-26 DIAGNOSIS — E119 Type 2 diabetes mellitus without complications: Secondary | ICD-10-CM | POA: Insufficient documentation

## 2014-04-26 DIAGNOSIS — Z794 Long term (current) use of insulin: Secondary | ICD-10-CM | POA: Diagnosis not present

## 2014-04-26 DIAGNOSIS — J45909 Unspecified asthma, uncomplicated: Secondary | ICD-10-CM | POA: Insufficient documentation

## 2014-04-26 DIAGNOSIS — Z87891 Personal history of nicotine dependence: Secondary | ICD-10-CM | POA: Diagnosis not present

## 2014-04-26 DIAGNOSIS — R0781 Pleurodynia: Secondary | ICD-10-CM | POA: Insufficient documentation

## 2014-04-26 DIAGNOSIS — Z9851 Tubal ligation status: Secondary | ICD-10-CM | POA: Diagnosis not present

## 2014-04-26 DIAGNOSIS — I10 Essential (primary) hypertension: Secondary | ICD-10-CM | POA: Diagnosis not present

## 2014-04-26 DIAGNOSIS — Z9049 Acquired absence of other specified parts of digestive tract: Secondary | ICD-10-CM | POA: Diagnosis not present

## 2014-04-26 DIAGNOSIS — Z9889 Other specified postprocedural states: Secondary | ICD-10-CM | POA: Insufficient documentation

## 2014-04-26 DIAGNOSIS — R109 Unspecified abdominal pain: Secondary | ICD-10-CM | POA: Diagnosis present

## 2014-04-26 DIAGNOSIS — Z7982 Long term (current) use of aspirin: Secondary | ICD-10-CM | POA: Diagnosis not present

## 2014-04-26 DIAGNOSIS — K219 Gastro-esophageal reflux disease without esophagitis: Secondary | ICD-10-CM | POA: Insufficient documentation

## 2014-04-26 DIAGNOSIS — Z79899 Other long term (current) drug therapy: Secondary | ICD-10-CM | POA: Insufficient documentation

## 2014-04-26 LAB — URINALYSIS, ROUTINE W REFLEX MICROSCOPIC
Bilirubin Urine: NEGATIVE
Glucose, UA: NEGATIVE mg/dL
Hgb urine dipstick: NEGATIVE
Ketones, ur: NEGATIVE mg/dL
Leukocytes, UA: NEGATIVE
Nitrite: NEGATIVE
Protein, ur: NEGATIVE mg/dL
Specific Gravity, Urine: 1.026 (ref 1.005–1.030)
Urobilinogen, UA: 0.2 mg/dL (ref 0.0–1.0)
pH: 5 (ref 5.0–8.0)

## 2014-04-26 LAB — CBG MONITORING, ED: Glucose-Capillary: 111 mg/dL — ABNORMAL HIGH (ref 70–99)

## 2014-04-26 MED ORDER — TRAMADOL HCL 50 MG PO TABS
50.0000 mg | ORAL_TABLET | Freq: Once | ORAL | Status: AC
  Administered 2014-04-26: 50 mg via ORAL
  Filled 2014-04-26: qty 1

## 2014-04-26 NOTE — ED Provider Notes (Signed)
CSN: [id number]     Arrival date & time 04/26/14  0848 History   None    Chief Complaint  Patient presents with  . Flank Pain    HPI   63 YOF presents with 3 days of left back/flank pain. She states that the pain started as a dull pain that was noticed with movement and has progressed to a severe stabbing pain. Its located over the right lateral 12th rib, does not radiate, made worse with forward/lateral flexion or palpation, and is at times unnoticeable at rest in the sitting position. She reports being able to use the treadmill yesterday without pain, but experienced the sharp sensation when attempted the exit the leg press machine. She has tried Ibuprofen 600 mg and found temporary relief of symptoms. She denies worsening pain with food, deep breathing.  Deneis N/VD, increased frequency of urination, discharge, burning, change in bowel habits, fevers, cough, abdominal pain, or leg swelling.    Past Medical History  Diagnosis Date  . Asthma   . Diabetes mellitus   . GERD (gastroesophageal reflux disease)   . Hypertension   . Hyperlipidemia    Past Surgical History  Procedure Laterality Date  . Cholecystectomy  2006  . Tubal ligation  1988  . Cesarean section  1988  . Fracture surgery  1992    lEFT ANKLE orif   Family History  Problem Relation Age of Onset  . Cancer Mother 15    lUNG CANCER  . Heart disease Father 37  . Heart attack Father    History  Substance Use Topics  . Smoking status: Former Smoker    Quit date: 02/19/1995  . Smokeless tobacco: Not on file     Comment: stopped in1975  . Alcohol Use: No   OB History    No data available     Review of Systems  All other systems reviewed and are negative.   Allergies  Other  Home Medications   Prior to Admission medications   Medication Sig Start Date End Date Taking? Authorizing Provider  albuterol (PROVENTIL HFA) 108 (90 BASE) MCG/ACT inhaler Inhale 2 puffs into the lungs every 4 (four) hours as needed  for shortness of breath. 10/01/11   Trinda Pascal, MD  aspirin 81 MG tablet Take 81 mg by mouth daily. Pt states she does not take this medication daily, she takes it about once every three days    Historical Provider, MD  azithromycin (ZITHROMAX) 250 MG tablet Take 2 tabs by mouth on day 1 followed by 1 day by mouth each day for 4 days. Patient not taking: Reported on 03/04/2014 03/24/12   Pedro Earls, MD  cetirizine (ZYRTEC) 10 MG tablet Take 1 tablet (10 mg total) by mouth daily. Patient not taking: Reported on 03/04/2014 10/01/11   Trinda Pascal, MD  hydrochlorothiazide (HYDRODIURIL) 25 MG tablet Take 1 tablet (25 mg total) by mouth daily. 01/05/13   Corky Sox, MD  ibuprofen (ADVIL,MOTRIN) 600 MG tablet Take 1 tablet (600 mg total) by mouth every 6 (six) hours as needed for pain. 03/17/12   Jerrye Noble, MD  insulin NPH-regular (NOVOLIN 70/30) (70-30) 100 UNIT/ML injection Inject 35 Units into the skin 2 (two) times daily with a meal. 08/06/12   Trinda Pascal, MD  Insulin Pen Needle 32G X 6 MM MISC 1 Device by Does not apply route Nightly. Patient not taking: Reported on 03/04/2014 10/01/11   Trinda Pascal, MD  Insulin Syringe-Needle U-100 (INSULIN  SYRINGE .5CC/31GX5/16") 31G X 5/16" 0.5 ML MISC Please use to draw up insulin as prescribed 11/13/11   Trinda Pascal, MD  Insulin Syringe-Needle U-100 (RELION INSULIN SYR 0.5ML/31G) 31G X 5/16" 0.5 ML MISC You can use this syringe to draw up doses more than 30 units. 05/12/12   Trinda Pascal, MD  lovastatin (MEVACOR) 20 MG tablet Take 1 tablet (20 mg total) by mouth daily. 03/16/12 03/16/13  Trinda Pascal, MD  metFORMIN (GLUCOPHAGE) 1000 MG tablet Take 1 tablet (1,000 mg total) by mouth 2 (two) times daily with a meal. 05/13/12   Trinda Pascal, MD  omeprazole (PRILOSEC) 40 MG capsule Take 1 capsule (40 mg total) by mouth daily. Patient not taking: Reported on 03/04/2014 06/25/12   Trinda Pascal, MD  ondansetron (ZOFRAN) 4 MG tablet Take  1 tablet (4 mg total) by mouth every 6 (six) hours. Patient not taking: Reported on 03/04/2014 03/02/12   Janne Napoleon, NP  pravastatin (PRAVACHOL) 20 MG tablet Take 1 tablet (20 mg total) by mouth at bedtime. 01/05/13   Corky Sox, MD   BP 132/66 mmHg  Pulse 71  Temp(Src) 97.7 F (36.5 C) (Oral)  Resp 18  Ht 5\' 7"  (1.702 m)  Wt 203 lb (92.08 kg)  BMI 31.79 kg/m2  SpO2 99% Physical Exam  Constitutional: She is oriented to person, place, and time. She appears well-developed and well-nourished.  HENT:  Head: Normocephalic and atraumatic.  Eyes: Pupils are equal, round, and reactive to light.  Neck: Normal range of motion. Neck supple. No JVD present. No tracheal deviation present. No thyromegaly present.  Cardiovascular: Normal rate, regular rhythm, normal heart sounds and intact distal pulses.  Exam reveals no gallop and no friction rub.   No murmur heard. Pulmonary/Chest: Effort normal and breath sounds normal. No stridor. No respiratory distress. She has no wheezes. She has no rales. She exhibits no tenderness.  Point tenderness with palpation to right posterior/lateral aspect of rib. No pain with AP/Lateral compression. No signs of rash, inflammation, bruising, or trauma.   Abdominal: Soft. Bowel sounds are normal. She exhibits no distension and no mass. There is no tenderness. There is no rebound and no guarding.  Musculoskeletal: Normal range of motion.  Lymphadenopathy:    She has no cervical adenopathy.  Neurological: She is alert and oriented to person, place, and time. Coordination normal.  Skin: Skin is warm and dry.  Psychiatric: She has a normal mood and affect. Her behavior is normal. Judgment and thought content normal.  Nursing note and vitals reviewed.   ED Course  Procedures (including critical care time) Labs Review Labs Reviewed - No data to display  Imaging Review No results found.   EKG Interpretation None      MDM   Final diagnoses:  Rib pain     Labs: Urinalysis normal , CBG 111  Assessment/Plan: Pts history and physical exam likely represent a soft tissue injury to the lateral chest. Presentation makes rib frature highly unlikely with no need for chest x-ray. Pt was instructed to avoid activities tht aggravate pain, use Ibuprofen as needed for pain, and follow up with PCP if pain does not improve within 1 week. If pt begins to experience worsening pain or red flags discussed please return for further evaluation.        Okey Regal, PA-C 04/26/14 1538  Varney Biles, MD 04/26/14 [phone]

## 2014-04-26 NOTE — ED Notes (Signed)
Pt states she nearly fell twice while in the bathroom. Pt states when she moves it feels like something is pulling her down on her right side.

## 2014-04-26 NOTE — ED Notes (Signed)
Rt flank is tender to palpate.

## 2014-04-26 NOTE — ED Notes (Signed)
Pt states she has gained 30 lbs in 6 months.

## 2014-04-26 NOTE — ED Notes (Signed)
Pt states pain is worse with movement

## 2014-04-26 NOTE — ED Notes (Signed)
Pt here for right flank pain onset gradual over the last several days, hurts worse with movement, denies any other factors increasing pain .

## 2014-04-26 NOTE — Discharge Instructions (Signed)
Chest Pain (Nonspecific) °It is often hard to give a specific diagnosis for the cause of chest pain. There is always a chance that your pain could be related to something serious, such as a heart attack or a blood clot in the lungs. You need to follow up with your health care provider for further evaluation. °CAUSES  °· Heartburn. °· Pneumonia or bronchitis. °· Anxiety or stress. °· Inflammation around your heart (pericarditis) or lung (pleuritis or pleurisy). °· A blood clot in the lung. °· A collapsed lung (pneumothorax). It can develop suddenly on its own (spontaneous pneumothorax) or from trauma to the chest. °· Shingles infection (herpes zoster virus). °The chest wall is composed of bones, muscles, and cartilage. Any of these can be the source of the pain. °· The bones can be bruised by injury. °· The muscles or cartilage can be strained by coughing or overwork. °· The cartilage can be affected by inflammation and become sore (costochondritis). °DIAGNOSIS  °Lab tests or other studies may be needed to find the cause of your pain. Your health care provider may have you take a test called an ambulatory electrocardiogram (ECG). An ECG records your heartbeat patterns over a 24-hour period. You may also have other tests, such as: °· Transthoracic echocardiogram (TTE). During echocardiography, sound waves are used to evaluate how blood flows through your heart. °· Transesophageal echocardiogram (TEE). °· Cardiac monitoring. This allows your health care provider to monitor your heart rate and rhythm in real time. °· Holter monitor. This is a portable device that records your heartbeat and can help diagnose heart arrhythmias. It allows your health care provider to track your heart activity for several days, if needed. °· Stress tests by exercise or by giving medicine that makes the heart beat faster. °TREATMENT  °· Treatment depends on what may be causing your chest pain. Treatment may include: °¨ Acid blockers for  heartburn. °¨ Anti-inflammatory medicine. °¨ Pain medicine for inflammatory conditions. °¨ Antibiotics if an infection is present. °· You may be advised to change lifestyle habits. This includes stopping smoking and avoiding alcohol, caffeine, and chocolate. °· You may be advised to keep your head raised (elevated) when sleeping. This reduces the chance of acid going backward from your stomach into your esophagus. °Most of the time, nonspecific chest pain will improve within 2-3 days with rest and mild pain medicine.  °HOME CARE INSTRUCTIONS  °· If antibiotics were prescribed, take them as directed. Finish them even if you start to feel better. °· For the next few days, avoid physical activities that bring on chest pain. Continue physical activities as directed. °· Do not use any tobacco products, including cigarettes, chewing tobacco, or electronic cigarettes. °· Avoid drinking alcohol. °· Only take medicine as directed by your health care provider. °· Follow your health care provider's suggestions for further testing if your chest pain does not go away. °· Keep any follow-up appointments you made. If you do not go to an appointment, you could develop lasting (chronic) problems with pain. If there is any problem keeping an appointment, call to reschedule. °SEEK MEDICAL CARE IF:  °· Your chest pain does not go away, even after treatment. °· You have a rash with blisters on your chest. °· You have a fever. °SEEK IMMEDIATE MEDICAL CARE IF:  °· You have increased chest pain or pain that spreads to your arm, neck, jaw, back, or abdomen. °· You have shortness of breath. °· You have an increasing cough, or you cough   up blood.  You have severe back or abdominal pain.  You feel nauseous or vomit.  You have severe weakness.  You faint.  You have chills. This is an emergency. Do not wait to see if the pain will go away. Get medical help at once. Call your local emergency services (911 in [place] Do not drive  yourself to the hospital. MAKE SURE YOU:   Understand these instructions.  Will watch your condition.  Will get help right away if you are not doing well or get worse. Document Released: 11/14/2004 Document Revised: 02/09/2013 Document Reviewed: 09/10/2007 [facility] Patient Information 2015 [place], [place]. This information is not intended to replace advice given to you by your health care provider. Make sure you discuss any questions you have with your health care provider.  Please avoid aggravating activities, Ibuprofen 600 mg three times a day as needed for pain. Please follow-up with PCP in 1 week for re-evaluation. If symptoms worsening and are uncontrolled with current plan, or if you experience SOB, abdominal pain, changes in urination or fever, please seek immediate medical care.

## 2014-05-20 DIAGNOSIS — L309 Dermatitis, unspecified: Secondary | ICD-10-CM | POA: Diagnosis not present

## 2014-05-23 ENCOUNTER — Encounter (HOSPITAL_COMMUNITY): Payer: Self-pay | Admitting: Emergency Medicine

## 2014-05-23 ENCOUNTER — Emergency Department (HOSPITAL_COMMUNITY)
Admission: EM | Admit: 2014-05-23 | Discharge: 2014-05-23 | Discharge status: Against Medical Advice | Payer: Medicare Other | Attending: Emergency Medicine | Admitting: Emergency Medicine

## 2014-05-23 DIAGNOSIS — I10 Essential (primary) hypertension: Secondary | ICD-10-CM | POA: Diagnosis not present

## 2014-05-23 DIAGNOSIS — E1165 Type 2 diabetes mellitus with hyperglycemia: Secondary | ICD-10-CM | POA: Diagnosis not present

## 2014-05-23 DIAGNOSIS — J45909 Unspecified asthma, uncomplicated: Secondary | ICD-10-CM | POA: Insufficient documentation

## 2014-05-23 LAB — CBC WITH DIFFERENTIAL/PLATELET
Basophils Absolute: 0 10*3/uL (ref 0.0–0.1)
Basophils Relative: 0 % (ref 0–1)
Eosinophils Absolute: 0.1 10*3/uL (ref 0.0–0.7)
Eosinophils Relative: 1 % (ref 0–5)
HCT: 34.6 % — ABNORMAL LOW (ref 36.0–46.0)
Hemoglobin: 11.3 g/dL — ABNORMAL LOW (ref 12.0–15.0)
Lymphocytes Relative: 19 % (ref 12–46)
Lymphs Abs: 1.6 10*3/uL (ref 0.7–4.0)
MCH: 29.5 pg (ref 26.0–34.0)
MCHC: 32.7 g/dL (ref 30.0–36.0)
MCV: 90.3 fL (ref 78.0–100.0)
Monocytes Absolute: 0.7 10*3/uL (ref 0.1–1.0)
Monocytes Relative: 9 % (ref 3–12)
Neutro Abs: 6.2 10*3/uL (ref 1.7–7.7)
Neutrophils Relative %: 71 % (ref 43–77)
Platelets: 240 10*3/uL (ref 150–400)
RBC: 3.83 MIL/uL — ABNORMAL LOW (ref 3.87–5.11)
RDW: 14.4 % (ref 11.5–15.5)
WBC: 8.7 10*3/uL (ref 4.0–10.5)

## 2014-05-23 LAB — I-STAT CHEM 8, ED
BUN: 12 mg/dL (ref 6–23)
Calcium, Ion: 1.18 mmol/L (ref 1.13–1.30)
Chloride: 100 mmol/L (ref 96–112)
Creatinine, Ser: 0.7 mg/dL (ref 0.50–1.10)
Glucose, Bld: 197 mg/dL — ABNORMAL HIGH (ref 70–99)
HCT: 35 % — ABNORMAL LOW (ref 36.0–46.0)
Hemoglobin: 11.9 g/dL — ABNORMAL LOW (ref 12.0–15.0)
Potassium: 3.4 mmol/L — ABNORMAL LOW (ref 3.5–5.1)
Sodium: 139 mmol/L (ref 135–145)
TCO2: 23 mmol/L (ref 0–100)

## 2014-05-23 LAB — CBG MONITORING, ED: Glucose-Capillary: 182 mg/dL — ABNORMAL HIGH (ref 70–99)

## 2014-05-23 NOTE — ED Notes (Signed)
The patient said she was not going to wait any longer.  She said her episode happened at 1000 this morning and she is not sure why her doctor sent her here.  Patient is alert, oriented X4.  She is walking and talking fine.

## 2014-05-23 NOTE — ED Notes (Signed)
Pt st's she is diabetic and this am her sugar was high and she had a syncopal episode and fell hitting her right forehead.  Pt now st's she feels slugglish and shaky.

## 2014-05-25 DIAGNOSIS — J019 Acute sinusitis, unspecified: Secondary | ICD-10-CM | POA: Diagnosis not present

## 2014-05-25 DIAGNOSIS — E876 Hypokalemia: Secondary | ICD-10-CM | POA: Diagnosis not present

## 2014-05-25 DIAGNOSIS — R55 Syncope and collapse: Secondary | ICD-10-CM | POA: Diagnosis not present

## 2014-05-25 DIAGNOSIS — E119 Type 2 diabetes mellitus without complications: Secondary | ICD-10-CM | POA: Diagnosis not present

## 2014-05-25 DIAGNOSIS — R350 Frequency of micturition: Secondary | ICD-10-CM | POA: Diagnosis not present

## 2014-05-26 ENCOUNTER — Other Ambulatory Visit: Payer: Self-pay | Admitting: Family

## 2014-05-26 ENCOUNTER — Other Ambulatory Visit (HOSPITAL_COMMUNITY): Payer: Self-pay | Admitting: Family Medicine

## 2014-05-27 ENCOUNTER — Other Ambulatory Visit: Payer: Self-pay | Admitting: Family Medicine

## 2014-05-27 DIAGNOSIS — N6459 Other signs and symptoms in breast: Secondary | ICD-10-CM

## 2014-06-02 ENCOUNTER — Other Ambulatory Visit: Payer: Self-pay | Admitting: Family Medicine

## 2014-06-02 DIAGNOSIS — R234 Changes in skin texture: Secondary | ICD-10-CM

## 2014-06-02 DIAGNOSIS — N6459 Other signs and symptoms in breast: Secondary | ICD-10-CM

## 2014-06-21 ENCOUNTER — Ambulatory Visit
Admission: RE | Admit: 2014-06-21 | Discharge: 2014-06-21 | Disposition: A | Discharge status: Home / Self Care | Payer: Medicare Other | Source: Ambulatory Visit | Attending: Family Medicine | Admitting: Family Medicine

## 2014-06-21 DIAGNOSIS — R234 Changes in skin texture: Secondary | ICD-10-CM

## 2014-06-21 DIAGNOSIS — N6459 Other signs and symptoms in breast: Secondary | ICD-10-CM

## 2014-06-21 DIAGNOSIS — Z6831 Body mass index (BMI) 31.0-31.9, adult: Secondary | ICD-10-CM | POA: Diagnosis not present

## 2014-06-21 DIAGNOSIS — I10 Essential (primary) hypertension: Secondary | ICD-10-CM | POA: Diagnosis not present

## 2014-06-21 DIAGNOSIS — R55 Syncope and collapse: Secondary | ICD-10-CM | POA: Diagnosis not present

## 2014-06-21 DIAGNOSIS — E785 Hyperlipidemia, unspecified: Secondary | ICD-10-CM | POA: Diagnosis not present

## 2014-06-29 DIAGNOSIS — R55 Syncope and collapse: Secondary | ICD-10-CM | POA: Diagnosis not present

## 2014-06-30 DIAGNOSIS — Q839 Congenital malformation of breast, unspecified: Secondary | ICD-10-CM | POA: Diagnosis not present

## 2014-07-18 ENCOUNTER — Emergency Department (HOSPITAL_COMMUNITY)
Admission: EM | Admit: 2014-07-18 | Discharge: 2014-07-18 | Disposition: A | Discharge status: Home / Self Care | Payer: Medicare Other | Source: Home / Self Care | Attending: Family Medicine | Admitting: Family Medicine

## 2014-07-18 ENCOUNTER — Emergency Department (INDEPENDENT_AMBULATORY_CARE_PROVIDER_SITE_OTHER): Payer: Medicare Other

## 2014-07-18 ENCOUNTER — Encounter (HOSPITAL_COMMUNITY): Payer: Self-pay | Admitting: Emergency Medicine

## 2014-07-18 DIAGNOSIS — S93601A Unspecified sprain of right foot, initial encounter: Secondary | ICD-10-CM | POA: Diagnosis not present

## 2014-07-18 DIAGNOSIS — S99921A Unspecified injury of right foot, initial encounter: Secondary | ICD-10-CM | POA: Diagnosis not present

## 2014-07-18 MED ORDER — NAPROXEN 375 MG PO TABS: 375.0000 mg | ORAL_TABLET | Freq: Two times a day (BID) | ORAL | Status: DC

## 2014-07-18 NOTE — ED Provider Notes (Signed)
Alyssa Haynes is a 69 y.o. female who presents to Urgent Care today for right lateral foot pain. Patient suffered an inversion injury to her right foot and ankle 5 days ago. She notes pain and swelling in the lateral foot. The pain is worse with standing and walking. She's tried ibuprofen which helps. No fevers or chills nausea vomiting or diarrhea.   Past Medical History  Diagnosis Date  . Asthma   . Diabetes mellitus   . GERD (gastroesophageal reflux disease)   . Hypertension   . Hyperlipidemia    Past Surgical History  Procedure Laterality Date  . Cholecystectomy  2006  . Tubal ligation  1988  . Cesarean section  1988  . Fracture surgery  1992    lEFT ANKLE orif   History  Substance Use Topics  . Smoking status: Former Smoker    Quit date: 02/19/1995  . Smokeless tobacco: Not on file     Comment: stopped in1975  . Alcohol Use: No   ROS as above Medications: No current facility-administered medications for this encounter.   Current Outpatient Prescriptions  Medication Sig Dispense Refill  . albuterol (PROVENTIL HFA) 108 (90 BASE) MCG/ACT inhaler Inhale 2 puffs into the lungs every 4 (four) hours as needed for shortness of breath. 3 each 4  . Ascorbic Acid (VITAMIN C) 1000 MG tablet Take 1,000 mg by mouth daily.    . calcium carbonate (OS-CAL) 600 MG TABS tablet Take 600 mg by mouth daily with breakfast.    . cetirizine (ZYRTEC) 10 MG tablet Take 1 tablet (10 mg total) by mouth daily. 30 tablet 4  . hydrochlorothiazide (HYDRODIURIL) 25 MG tablet Take 1 tablet (25 mg total) by mouth daily. 90 tablet 4  . ibuprofen (ADVIL,MOTRIN) 600 MG tablet Take 1 tablet (600 mg total) by mouth every 6 (six) hours as needed for pain. 60 tablet 0  . insulin NPH-regular (NOVOLIN 70/30) (70-30) 100 UNIT/ML injection Inject 35 Units into the skin 2 (two) times daily with a meal. 10 mL 12  . Insulin Pen Needle 32G X 6 MM MISC 1 Device by Does not apply route Nightly. (Patient not taking:  Reported on 03/04/2014) 60 each 11  . Insulin Syringe-Needle U-100 (INSULIN SYRINGE .5CC/31GX5/16") 31G X 5/16" 0.5 ML MISC Please use to draw up insulin as prescribed 100 each 5  . Insulin Syringe-Needle U-100 (RELION INSULIN SYR 0.5ML/31G) 31G X 5/16" 0.5 ML MISC You can use this syringe to draw up doses more than 30 units. 100 each 2  . losartan (COZAAR) 25 MG tablet Take 25 mg by mouth daily.    Marland Kitchen lovastatin (MEVACOR) 20 MG tablet Take 1 tablet (20 mg total) by mouth daily. 30 tablet 11  . metFORMIN (GLUCOPHAGE) 1000 MG tablet Take 1 tablet (1,000 mg total) by mouth 2 (two) times daily with a meal. 90 tablet 3  . naproxen (NAPROSYN) 375 MG tablet Take 1 tablet (375 mg total) by mouth 2 (two) times daily. 20 tablet 0  . omeprazole (PRILOSEC) 40 MG capsule Take 1 capsule (40 mg total) by mouth daily. (Patient not taking: Reported on 03/04/2014) 90 capsule 3  . ondansetron (ZOFRAN) 4 MG tablet Take 1 tablet (4 mg total) by mouth every 6 (six) hours. (Patient not taking: Reported on 03/04/2014) 12 tablet 0  . pravastatin (PRAVACHOL) 20 MG tablet Take 1 tablet (20 mg total) by mouth at bedtime. 90 tablet 4  . VITAMIN D, CHOLECALCIFEROL, PO Take 1 capsule by mouth daily.  Allergies  Allergen Reactions  . Other Itching and Rash    Wool      Exam:  BP 134/74 mmHg  Pulse 81  Temp(Src) 98.1 F (36.7 C) (Oral)  Resp 16  SpO2 100% Gen: Well NAD HEENT: EOMI,  MMM Lungs: Normal work of breathing. CTABL Heart: RRR no MRG Abd: NABS, Soft. Nondistended, Nontender Exts: Brisk capillary refill, warm and well perfused.  Right leg: Knee nontender normal motion Ankle swelling nontender. Normal motion stable ligamentous exam Foot swollen tender over the cuboid. Nontender fifth metatarsal. Stable ligamentous exam Pulses capillary refill and sensation intact distally.  No results found for this or any previous visit (from the past 24 hour(s)). Dg Foot Complete Right  07/18/2014   CLINICAL DATA:   Twisted right foot walking in the park, fifth metatarsal pain  EXAM: RIGHT FOOT COMPLETE - 3+ VIEW  COMPARISON:  None.  FINDINGS: Three views of the right foot submitted. No acute fracture or subluxation. No radiopaque foreign body.  IMPRESSION: Negative.   Electronically Signed   By: Lahoma Crocker M.D.   On: 07/18/2014 15:29    Assessment and Plan: 69 y.o. female with likely right ankle sprain is the cause of her lateral foot pain. Pain and swelling is in the region of the ATFL. She is quite painful despite the injury being 5 days ago. We'll use a cam walker for comfort follow-up with orthopedics. We additionally use naproxen for pain control.  Discussed warning signs or symptoms. Please see discharge instructions. Patient expresses understanding.     Gregor Hams, MD 07/18/14 [phone]

## 2014-07-18 NOTE — ED Notes (Signed)
C/o right foot pain due to falling five days ago States she fell and twisted ankle States right side of foot hurts States walking or putting any pressure on foot hurts Did ice and elevate foot

## 2014-07-18 NOTE — Discharge Instructions (Signed)
Thank you for coming in today.  Use the cam walker as needed.  Follow up with [place].  Return as needed.  Do not drive while wearing the Cam Walker  Ankle Sprain An ankle sprain is an injury to the strong, fibrous tissues (ligaments) that hold the bones of your ankle joint together.  CAUSES An ankle sprain is usually caused by a fall or by twisting your ankle. Ankle sprains most commonly occur when you step on the outer edge of your foot, and your ankle turns inward. People who participate in sports are more prone to these types of injuries.  SYMPTOMS   Pain in your ankle. The pain may be present at rest or only when you are trying to stand or walk.  Swelling.  Bruising. Bruising may develop immediately or within 1 to 2 days after your injury.  Difficulty standing or walking, particularly when turning corners or changing directions. DIAGNOSIS  Your caregiver will ask you details about your injury and perform a physical exam of your ankle to determine if you have an ankle sprain. During the physical exam, your caregiver will press on and apply pressure to specific areas of your foot and ankle. Your caregiver will try to move your ankle in certain ways. An X-ray exam may be done to be sure a bone was not broken or a ligament did not separate from one of the bones in your ankle (avulsion fracture).  TREATMENT  Certain types of braces can help stabilize your ankle. Your caregiver can make a recommendation for this. Your caregiver may recommend the use of medicine for pain. If your sprain is severe, your caregiver may refer you to a surgeon who helps to restore function to parts of your skeletal system (orthopedist) or a physical therapist. [place] ice to your injury for 1-2 days or as directed by your caregiver. Applying ice helps to reduce inflammation and pain.  Put ice in a plastic bag.  Place a towel between your skin and the bag.  Leave the ice on  for 15-20 minutes at a time, every 2 hours while you are awake.  Only take over-the-counter or prescription medicines for pain, discomfort, or fever as directed by your caregiver.  Elevate your injured ankle above the level of your heart as much as possible for 2-3 days.  If your caregiver recommends crutches, use them as instructed. Gradually put weight on the affected ankle. Continue to use crutches or a cane until you can walk without feeling pain in your ankle.  If you have a plaster splint, wear the splint as directed by your caregiver. Do not rest it on anything harder than a pillow for the first 24 hours. Do not put weight on it. Do not get it wet. You may take it off to take a shower or bath.  You may have been given an elastic bandage to wear around your ankle to provide support. If the elastic bandage is too tight (you have numbness or tingling in your foot or your foot becomes cold and blue), adjust the bandage to make it comfortable.  If you have an air splint, you may blow more air into it or let air out to make it more comfortable. You may take your splint off at night and before taking a shower or bath. Wiggle your toes in the splint several times per day to decrease swelling. SEEK MEDICAL CARE IF:   You have rapidly increasing bruising or swelling.  Your toes feel extremely cold or you lose feeling in your foot.  Your pain is not relieved with medicine. SEEK IMMEDIATE MEDICAL CARE IF:  Your toes are numb or blue.  You have severe pain that is increasing. MAKE SURE YOU:   Understand these instructions.  Will watch your condition.  Will get help right away if you are not doing well or get worse. Document Released: 02/04/2005 Document Revised: 10/30/2011 Document Reviewed: 02/16/2011 [facility] Patient Information 2015 [place], [place]. This information is not intended to replace advice given to you by your health care provider. Make sure you discuss any questions you have  with your health care provider.

## 2014-07-20 DIAGNOSIS — R55 Syncope and collapse: Secondary | ICD-10-CM | POA: Diagnosis not present

## 2014-07-27 DIAGNOSIS — S96911A Strain of unspecified muscle and tendon at ankle and foot level, right foot, initial encounter: Secondary | ICD-10-CM | POA: Diagnosis not present

## 2014-07-28 DIAGNOSIS — R55 Syncope and collapse: Secondary | ICD-10-CM | POA: Diagnosis not present

## 2014-10-11 DIAGNOSIS — E119 Type 2 diabetes mellitus without complications: Secondary | ICD-10-CM | POA: Diagnosis not present

## 2014-10-11 DIAGNOSIS — E876 Hypokalemia: Secondary | ICD-10-CM | POA: Diagnosis not present

## 2014-10-11 DIAGNOSIS — Z23 Encounter for immunization: Secondary | ICD-10-CM | POA: Diagnosis not present

## 2014-10-11 DIAGNOSIS — E785 Hyperlipidemia, unspecified: Secondary | ICD-10-CM | POA: Diagnosis not present

## 2014-10-11 DIAGNOSIS — I10 Essential (primary) hypertension: Secondary | ICD-10-CM | POA: Diagnosis not present

## 2014-10-11 DIAGNOSIS — M7071 Other bursitis of hip, right hip: Secondary | ICD-10-CM | POA: Diagnosis not present

## 2014-12-08 DIAGNOSIS — N952 Postmenopausal atrophic vaginitis: Secondary | ICD-10-CM | POA: Diagnosis not present

## 2014-12-08 DIAGNOSIS — N898 Other specified noninflammatory disorders of vagina: Secondary | ICD-10-CM | POA: Diagnosis not present

## 2014-12-08 DIAGNOSIS — N950 Postmenopausal bleeding: Secondary | ICD-10-CM | POA: Diagnosis not present

## 2014-12-08 DIAGNOSIS — N3281 Overactive bladder: Secondary | ICD-10-CM | POA: Diagnosis not present

## 2014-12-14 DIAGNOSIS — N950 Postmenopausal bleeding: Secondary | ICD-10-CM | POA: Diagnosis not present

## 2015-01-05 DIAGNOSIS — H35033 Hypertensive retinopathy, bilateral: Secondary | ICD-10-CM | POA: Diagnosis not present

## 2015-01-05 DIAGNOSIS — H2513 Age-related nuclear cataract, bilateral: Secondary | ICD-10-CM | POA: Diagnosis not present

## 2015-01-05 DIAGNOSIS — E113293 Type 2 diabetes mellitus with mild nonproliferative diabetic retinopathy without macular edema, bilateral: Secondary | ICD-10-CM | POA: Diagnosis not present

## 2015-01-05 DIAGNOSIS — H40013 Open angle with borderline findings, low risk, bilateral: Secondary | ICD-10-CM | POA: Diagnosis not present

## 2015-01-11 DIAGNOSIS — Z8709 Personal history of other diseases of the respiratory system: Secondary | ICD-10-CM | POA: Diagnosis not present

## 2015-01-11 DIAGNOSIS — J00 Acute nasopharyngitis [common cold]: Secondary | ICD-10-CM | POA: Diagnosis not present

## 2015-02-17 DIAGNOSIS — M7061 Trochanteric bursitis, right hip: Secondary | ICD-10-CM | POA: Diagnosis not present

## 2015-02-17 DIAGNOSIS — M7062 Trochanteric bursitis, left hip: Secondary | ICD-10-CM | POA: Diagnosis not present

## 2015-11-13 ENCOUNTER — Other Ambulatory Visit: Payer: Self-pay | Admitting: Family Medicine

## 2015-11-13 DIAGNOSIS — Z1231 Encounter for screening mammogram for malignant neoplasm of breast: Secondary | ICD-10-CM

## 2015-11-24 ENCOUNTER — Ambulatory Visit
Admission: RE | Admit: 2015-11-24 | Discharge: 2015-11-24 | Disposition: A | Discharge status: Home / Self Care | Payer: Medicare Other | Source: Ambulatory Visit | Attending: Family Medicine | Admitting: Family Medicine

## 2015-11-24 DIAGNOSIS — Z1231 Encounter for screening mammogram for malignant neoplasm of breast: Secondary | ICD-10-CM

## 2016-04-01 ENCOUNTER — Encounter: Payer: Self-pay | Admitting: Internal Medicine

## 2016-04-01 ENCOUNTER — Ambulatory Visit (INDEPENDENT_AMBULATORY_CARE_PROVIDER_SITE_OTHER): Payer: Medicare Other | Admitting: Internal Medicine

## 2016-04-01 DIAGNOSIS — Z8249 Family history of ischemic heart disease and other diseases of the circulatory system: Secondary | ICD-10-CM | POA: Diagnosis not present

## 2016-04-01 DIAGNOSIS — I872 Venous insufficiency (chronic) (peripheral): Secondary | ICD-10-CM

## 2016-04-01 DIAGNOSIS — E1169 Type 2 diabetes mellitus with other specified complication: Secondary | ICD-10-CM | POA: Diagnosis not present

## 2016-04-01 DIAGNOSIS — I10 Essential (primary) hypertension: Secondary | ICD-10-CM

## 2016-04-01 DIAGNOSIS — Z794 Long term (current) use of insulin: Principal | ICD-10-CM

## 2016-04-01 DIAGNOSIS — Z9851 Tubal ligation status: Secondary | ICD-10-CM | POA: Diagnosis not present

## 2016-04-01 DIAGNOSIS — Z87891 Personal history of nicotine dependence: Secondary | ICD-10-CM

## 2016-04-01 DIAGNOSIS — Z0000 Encounter for general adult medical examination without abnormal findings: Secondary | ICD-10-CM | POA: Insufficient documentation

## 2016-04-01 DIAGNOSIS — J45909 Unspecified asthma, uncomplicated: Secondary | ICD-10-CM | POA: Diagnosis not present

## 2016-04-01 DIAGNOSIS — E119 Type 2 diabetes mellitus without complications: Secondary | ICD-10-CM

## 2016-04-01 DIAGNOSIS — E784 Other hyperlipidemia: Secondary | ICD-10-CM

## 2016-04-01 DIAGNOSIS — Z7984 Long term (current) use of oral hypoglycemic drugs: Secondary | ICD-10-CM

## 2016-04-01 DIAGNOSIS — Z9049 Acquired absence of other specified parts of digestive tract: Secondary | ICD-10-CM | POA: Diagnosis not present

## 2016-04-01 DIAGNOSIS — Z801 Family history of malignant neoplasm of trachea, bronchus and lung: Secondary | ICD-10-CM

## 2016-04-01 DIAGNOSIS — E785 Hyperlipidemia, unspecified: Secondary | ICD-10-CM

## 2016-04-01 LAB — GLUCOSE, CAPILLARY: Glucose-Capillary: 147 mg/dL — ABNORMAL HIGH (ref 65–99)

## 2016-04-01 LAB — POCT GLYCOSYLATED HEMOGLOBIN (HGB A1C): Hemoglobin A1C: 7.6

## 2016-04-01 MED ORDER — PIOGLITAZONE HCL-METFORMIN HCL 15-850 MG PO TABS: 1.0000 | ORAL_TABLET | Freq: Two times a day (BID) | ORAL | 3 refills | Status: DC

## 2016-04-01 MED ORDER — PRAVASTATIN SODIUM 40 MG PO TABS: 40.0000 mg | ORAL_TABLET | Freq: Every day | ORAL | 3 refills | Status: DC

## 2016-04-01 MED ORDER — LOSARTAN POTASSIUM 50 MG PO TABS: 50.0000 mg | ORAL_TABLET | Freq: Every day | ORAL | 3 refills | Status: DC

## 2016-04-01 NOTE — Assessment & Plan Note (Addendum)
BP Readings from Last 3 Encounters:  04/01/16 (!) 126/57  07/18/14 134/74  05/23/14 160/62    Lab Results  Component Value Date   NA 139 05/23/2014   K 3.4 (L) 05/23/2014   CREATININE 0.70 05/23/2014    Assessment: Blood pressure control:  controlled Progress toward BP goal:   at goal Comments: Patient reports compliance with hydrochlorothiazide 25 mg once a day. She is no longer taking losartan. She denies any side effects to the medication losartan.  Plan: Medications:  Stop hydrochlorothiazide. Start losartan 50 mg once a day for ARB effect given diabetes. Other plans: Follow up in 3 months

## 2016-04-01 NOTE — Patient Instructions (Signed)
Ms. Budzynski,  It was a pleasure to meet today. The changes we made today are as follows: We have stopped her hydrochlorothiazide and started losartan 50 mg once a day. We have increased her pravastatin to 40 mg once a day. If you still have the 20 mg tablets left over, he can take 2 of these per day until you run out. Her A1c is well controlled at 7.6. Continue taking her current medications and remember to exercise information point We also discussed both colon cancer screening and osteoporosis screening today. Once you get your insurance card switched over, we can order a DEXA scan for you. Please also look for your records from your colonoscopy, so we can determine when we need to Screening for colon cancer.  Please follow up in 3 months.

## 2016-04-01 NOTE — Assessment & Plan Note (Addendum)
Lab Results  Component Value Date   HGBA1C 7.6 04/01/2016   HGBA1C 8.2 08/06/2012   HGBA1C 10.5 03/17/2012     Assessment: Diabetes control:  controlled Progress toward A1C goal:   improved Comments: Patient reports compliance with her combination metformin-Pioglitazone is unknown 850-15 milligrams twice a day.  Plan: Medications: Continue current regimen Other plans: Follow up in 3 months for repeat A1c. Discussed strategies to increase exercise and improved diet.

## 2016-04-01 NOTE — Assessment & Plan Note (Signed)
Breast cancer screening: Mammogram in October 2017 was normal Colon cancer screening: Patient reports she had a colonoscopy done 5-6 years ago which was reportedly normal. I cannot see these records in Epic. She will attempt to obtain these records for [place]. Osteoporosis screening: We discussed DEXA scan today. Patient would like to wait to schedule this.

## 2016-04-01 NOTE — Assessment & Plan Note (Signed)
Patient reports compliance with pravastatin 20 mg once a day. We will increase this to 40 mg once a day for moderate intensity given type 2 diabetes.

## 2016-04-01 NOTE — Assessment & Plan Note (Signed)
Patient reports occasional use of her as needed albuterol inhaler. She uses albuterol once or twice per month. She denies any nighttime awakenings or limitations with exercise.

## 2016-04-01 NOTE — Progress Notes (Signed)
    CC: Follow-up for hypertension and diabetes  HPI: [name] is a 71 y.o. female with PMHx of hypertension, diabetes, asthma, chronic venous insufficiency who presents to the clinic for follow-up for hypertension and diabetes.   Please see problem based assessment and plan for more information on patient's chronic medical conditions. Patient is eating and drinking well. She denies any chest pain or shortness of breath.  Family History  Problem Relation Age of Onset  . Cancer Mother 55    lUNG CANCER  . Heart disease Father 25  . Heart attack Father    Social History   Social History  . Marital status: Married    Spouse name: N/A  . Number of children: N/A  . Years of education: N/A   Occupational History  . Not on file.   Social History Main Topics  . Smoking status: Former Smoker    Quit date: 02/19/1995  . Smokeless tobacco: Not on file     Comment: stopped in1975  . Alcohol use No  . Drug use: No  . Sexual activity: Yes    Birth control/ protection: Surgical   Other Topics Concern  . Not on file   Social History Narrative  . No narrative on file   Past Surgical History:  Procedure Laterality Date  . CESAREAN SECTION  1988  . CHOLECYSTECTOMY  2006  . FRACTURE SURGERY  1992   lEFT ANKLE orif  . TUBAL LIGATION  1988    Past Medical History:  Diagnosis Date  . Asthma   . Diabetes mellitus   . GERD (gastroesophageal reflux disease)   . Hyperlipidemia   . Hypertension     Review of Systems: Please see pertinent ROS reviewed in HPI and problem based charting.   Physical Exam: Vitals:   04/01/16 0837  BP: (!) 126/57  Pulse: 80  Temp: 97.8 F (36.6 C)  TempSrc: Oral  SpO2: 96%  Weight: 205 lb (93 kg)  Height: 5\' 7"  (1.702 m)   General: Vital signs reviewed.  Patient is well-developed and well-nourished, in no acute distress and cooperative with exam.  Head: Normocephalic and atraumatic. Eyes: PERRLA, conjunctivae normal, no scleral  icterus.  Neck: Supple, trachea midline, no carotid bruit present.  Cardiovascular: RRR, S1 normal, S2 normal, no murmurs, gallops, or rubs. Pulmonary/Chest: Clear to auscultation bilaterally, no wheezes, rales, or rhonchi. Abdominal: Soft, non-tender, non-distended, BS + Extremities: No lower extremity edema bilaterally, pulses symmetric and intact bilaterally.  Skin: Warm, dry and intact. No rashes or erythema. Psychiatric: Normal mood and affect. speech and behavior is normal. Cognition and memory are normal.   Assessment & Plan:  See encounters tab for problem based medical decision making. Patient discussed with Dr. Angelia Mould

## 2016-04-01 NOTE — Assessment & Plan Note (Addendum)
The patient has a history of chronic venous insufficiency. She has seen vascular surgery in the past for this problem. BLE Venous Insufficiency Duplex (Date: 03/04/2014): RLE: no DVT and SVT, + GSV reflux: R calf, +SSV reflux, + deep venous reflux: CFV. LLE: no DVT and SVT, no GSV reflux, + deep venous reflux:CFV, PV. She had no evidence of peripheral arterial disease. Recommendations at previous visit were to use compressive therapy with 20-30 mm thigh high compression stockings. Today, patient denies any issues with lower extremity swelling or pain. There is no lower extremity edema on exam. Pedal Pulses are intact bilaterally.  Plan: Continue to monitor

## 2016-04-02 ENCOUNTER — Encounter: Payer: Self-pay | Admitting: Internal Medicine

## 2016-04-02 LAB — BMP8+ANION GAP
Anion Gap: 18 mmol/L (ref 10.0–18.0)
BUN/Creatinine Ratio: 15 (ref 12–28)
BUN: 13 mg/dL (ref 8–27)
CO2: 25 mmol/L (ref 18–29)
Calcium: 9.5 mg/dL (ref 8.7–10.3)
Chloride: 97 mmol/L (ref 96–106)
Creatinine, Ser: 0.88 mg/dL (ref 0.57–1.00)
GFR calc Af Amer: 78 mL/min/{1.73_m2} (ref >59)
GFR calc non Af Amer: 68 mL/min/{1.73_m2} (ref >59)
Glucose: 155 mg/dL — ABNORMAL HIGH (ref 65–99)
Potassium: 3.7 mmol/L (ref 3.5–5.2)
Sodium: 140 mmol/L (ref 134–144)

## 2016-04-02 LAB — CBC
Hematocrit: 36.9 % (ref 34.0–46.6)
Hemoglobin: 11.7 g/dL (ref 11.1–15.9)
MCH: 28.7 pg (ref 26.6–33.0)
MCHC: 31.7 g/dL (ref 31.5–35.7)
MCV: 90 fL (ref 79–97)
Platelets: 256 10*3/uL (ref 150–379)
RBC: 4.08 x10E6/uL (ref 3.77–5.28)
RDW: 14.8 % (ref 12.3–15.4)
WBC: 4.9 10*3/uL (ref 3.4–10.8)

## 2016-04-02 NOTE — Progress Notes (Signed)
Internal Medicine Clinic Attending  Case discussed with Dr. Burns at the time of the visit.  We reviewed the resident's history and exam and pertinent patient test results.  I agree with the assessment, diagnosis, and plan of care documented in the resident's note.  

## 2016-04-25 ENCOUNTER — Ambulatory Visit (INDEPENDENT_AMBULATORY_CARE_PROVIDER_SITE_OTHER): Payer: Medicare Other | Admitting: Internal Medicine

## 2016-04-25 VITALS — BP 141/74 | HR 63 | Temp 98.2°F | Ht 67.0 in | Wt 208.6 lb

## 2016-04-25 DIAGNOSIS — M2141 Flat foot [pes planus] (acquired), right foot: Secondary | ICD-10-CM

## 2016-04-25 DIAGNOSIS — Z87891 Personal history of nicotine dependence: Secondary | ICD-10-CM

## 2016-04-25 DIAGNOSIS — Z79899 Other long term (current) drug therapy: Secondary | ICD-10-CM

## 2016-04-25 DIAGNOSIS — I10 Essential (primary) hypertension: Secondary | ICD-10-CM | POA: Diagnosis not present

## 2016-04-25 DIAGNOSIS — M2142 Flat foot [pes planus] (acquired), left foot: Secondary | ICD-10-CM

## 2016-04-25 MED ORDER — LISINOPRIL 20 MG PO TABS: 20.0000 mg | ORAL_TABLET | Freq: Every day | ORAL | 1 refills | Status: DC

## 2016-04-25 NOTE — Progress Notes (Signed)
   CC: Hypertension  HPI:  Ms.Jacky D Nolton is a 71 y.o. with possible history presents to clinic for hypertension follow-up. Please see problem was further details.  Past Medical History:  Diagnosis Date  . Asthma   . Diabetes mellitus   . GERD (gastroesophageal reflux disease)   . Hyperlipidemia   . Hypertension     Review of Systems:  Positive for bilateral sore feet. Negative for numbness, burning, tingling in her feet or hands. Positive for all over body aches.   Physical Exam:  Vitals:   04/25/16 1018  BP: (!) 141/74  Pulse: 63  Temp: 98.2 F (36.8 C)  TempSrc: Oral  SpO2: 100%  Weight: 208 lb 9.6 oz (94.6 kg)  Height: 5\' 7"  (1.702 m)   Physical Exam  Constitutional:appears well-developed and well-nourished. No distress.  HENT:  Head: Normocephalic and atraumatic.  Nose: Nose normal.  Cardiovascular: Normal rate, regular rhythm and normal heart sounds.  Exam reveals no gallop and no friction rub.   No murmur heard. Pulmonary/Chest: Effort normal and breath sounds normal. No respiratory distress.  has no wheezes.no rales.  Abdominal: Soft. Bowel sounds are normal.  exhibits no distension. There is no tenderness. There is no rebound and no guarding.  Neurological: alert and oriented to person, place, and time.  Skin: Skin is warm and dry. No rash noted.  not diaphoretic. No erythema. No pallor.  Extremities: 2+ DP pulses bilaterally, flat arches bilaterally.  Assessment & Plan:   See Encounters Tab for problem based charting.  Patient discussed with Dr. Beryle Beams

## 2016-04-25 NOTE — Assessment & Plan Note (Signed)
Assessment: Patient was last seen in clinic on February 12 for hypertension. Her hydrochlorothiazide 25 mg was switched to losartan 50 mg due to renal benefits in setting of diabetes. Since starting this medication patient notes that she's had generalized body aches. She has never been on lisinopril before. Her blood pressures elevated today and she  took her morning meds.   Plan: Switch losartan 50 mg to lisinopril 20 mg daily. Follow-up in 2 weeks for blood pressure check and basic metabolic panel at that time. Likely she may need to be titrated up on her lisinopril.

## 2016-04-25 NOTE — Assessment & Plan Note (Signed)
Assessment: Patient requesting referral to podiatry or sports medicine for orthotics. She has bilateral foot soreness that does not improve despite shoe inserts and changing her foot wear. She has history of plantar fasciitis surgery on the right and left ankle surgery. On exam she has good pulses and low arches are noted bilaterally.  Plan: Referral to sports medicine for orthotics.

## 2016-04-25 NOTE — Patient Instructions (Signed)
Start taking lisinopril 20mg  and stop taking losartan.

## 2016-04-25 NOTE — Progress Notes (Signed)
Medicine attending: Medical history, presenting problems, physical findings, and medications, reviewed with resident physician Dr Diana Truong on the day of the patient visit and I concur with her evaluation and management plan. 

## 2016-05-03 ENCOUNTER — Ambulatory Visit (INDEPENDENT_AMBULATORY_CARE_PROVIDER_SITE_OTHER): Payer: Medicare Other | Admitting: Family Medicine

## 2016-05-03 ENCOUNTER — Encounter: Payer: Self-pay | Admitting: Family Medicine

## 2016-05-03 DIAGNOSIS — M25579 Pain in unspecified ankle and joints of unspecified foot: Secondary | ICD-10-CM | POA: Diagnosis not present

## 2016-05-05 DIAGNOSIS — M25579 Pain in unspecified ankle and joints of unspecified foot: Secondary | ICD-10-CM | POA: Insufficient documentation

## 2016-05-05 NOTE — Assessment & Plan Note (Signed)
Has chronic bilateral pain secondary to her pes planus. She would like to become more active again and not be limited by her feet.  - have her follow up in order to have custom orthotics made.

## 2016-05-05 NOTE — Progress Notes (Signed)
  Alyssa Haynes - 71 y.o. female MRN [id number]  Date of birth: Feb 12, 1946  SUBJECTIVE:  Including CC & ROS.   Alyssa Haynes is a 71 yo F that is presenting with bilateral foot pain. This pain is chronic for her. She has pain worse on the left foot than the right foot. She has flat feet and has tried several things to help with her pain. She has pain if she walks any time at all barefoot. She has pain on her achilles on her left foot. She denies any injuries to her feet. Has pain with minimal walking. Has tried different shoes with no relief. Has not had any custom orthotics.   ROS: No unexpected weight loss, fever, chills, swelling, instability, muscle pain, numbness/tingling, redness, otherwise see HPI    HISTORY: Past Medical, Surgical, Social, and Family History Reviewed & Updated per EMR.   Pertinent Historical Findings include: PMSHx -  Dm2, HTN, Plantar fascia on right foot, left ankle fracture  PSHx -  No tobacco or alcohol use  FHx -  Hear disease  Medications -  DATA REVIEWED: 07/18/14: right foot x-ray: normal  PHYSICAL EXAM:  VS: BP:133/69  HR: bpm  TEMP: ( )  RESP:   HT:5\' 7"  (170.2 cm)   WT:200 lb (90.7 kg)  BMI:31.4 PHYSICAL EXAM: Gen: NAD, alert, cooperative with exam, well-appearing HEENT: clear conjunctiva, EOMI CV:  no edema, capillary refill brisk,  Resp: non-labored, normal speech Skin: no rashes, normal turgor  Neuro: no gross deficits.  Psych:  alert and oriented Feet:  Obvious pes planus with standing  No significant areas of tenderness over her achilles b/l  No TTP of the calcanenous or origin of the PF b/l  Normal dorsalflexion and plantarflexion  Normal strength  Normal ankle ROM  Neurovascularly intact.   ASSESSMENT & PLAN:   Pain in joint, ankle and foot Has chronic bilateral pain secondary to her pes planus. She would like to become more active again and not be limited by her feet.  - have her follow up in order to have custom orthotics  made.

## 2016-05-09 ENCOUNTER — Ambulatory Visit: Payer: Medicare Other

## 2016-05-10 ENCOUNTER — Ambulatory Visit (INDEPENDENT_AMBULATORY_CARE_PROVIDER_SITE_OTHER): Payer: Medicare Other | Admitting: Family Medicine

## 2016-05-10 ENCOUNTER — Encounter: Payer: Self-pay | Admitting: Family Medicine

## 2016-05-10 VITALS — BP 150/69 | Ht 67.0 in | Wt 204.0 lb

## 2016-05-10 DIAGNOSIS — M2141 Flat foot [pes planus] (acquired), right foot: Secondary | ICD-10-CM | POA: Diagnosis not present

## 2016-05-10 DIAGNOSIS — M25579 Pain in unspecified ankle and joints of unspecified foot: Secondary | ICD-10-CM

## 2016-05-10 DIAGNOSIS — Z794 Long term (current) use of insulin: Secondary | ICD-10-CM

## 2016-05-10 DIAGNOSIS — M2142 Flat foot [pes planus] (acquired), left foot: Secondary | ICD-10-CM

## 2016-05-10 DIAGNOSIS — E119 Type 2 diabetes mellitus without complications: Secondary | ICD-10-CM | POA: Diagnosis not present

## 2016-05-10 DIAGNOSIS — R269 Unspecified abnormalities of gait and mobility: Secondary | ICD-10-CM

## 2016-05-12 NOTE — Progress Notes (Signed)
    CHIEF COMPLAINT / HPI:   follow-up bilateral foot pain.  She is also having some left lateral ankle pain.  Previous history of ankle reconstruction on that side.  She is here today for further evaluation and consideration of custom molded orthotics.  REVIEW OF SYSTEMS:  ffoot and ankle pain that is chronic.  No unusual weight change.  No rash or skin lesions noted on the area of the foot and ankle.  No fever.  OBJECTIVE:  Vital signs are reviewed.   GENERAL: Well-developed female in no acute distress FEEObvious pes planus with standing  No significant areas of tenderness over her achilles b/l  No TTP of the calcanenous or origin of the PF b/l  Normal dorsalflexion and plantarflexion  Normal strength  Normal ankle ROM  Neurovascularly intactT: Patient was fitted for a : standard, cushioned, semi-rigid orthotic. The orthotic was heated, placed on the orthotic stand. The patient was positioned in subtalar neutral position and 10 degrees of ankle dorsiflexion in a weight bearing stance on the heated orthotic blank After completion of molding, a stable base was applied to the orthotic blank. The blank was ground to a stable position for weight bearing. Blank: size 9 and red foam Base:White F 4 Posting:none  Face to face time spent in evaluation, measurement and manufacture of custom molded orthotic was 40 minutes.  Given the instruction of her tissues, we had a little trouble getting these actually to fit into her shoes.  She is considering buying a new pair so we suggested she get a half size larger.  If she does continue to have problems with this feeling like that she was too full and she does not buy new shoes, we could consider remaking these with flu E VA base although I think given her drastic foot problems she would be better served by the more supportive F number for base that we've used today.  If we did have to remake her some orthotics, I would do that at no  charge. ASSESSMENT / PLAN: Please see problem oriented charting for details

## 2016-05-15 ENCOUNTER — Ambulatory Visit (INDEPENDENT_AMBULATORY_CARE_PROVIDER_SITE_OTHER): Payer: Medicare Other | Admitting: Internal Medicine

## 2016-05-15 ENCOUNTER — Encounter: Payer: Self-pay | Admitting: Internal Medicine

## 2016-05-15 ENCOUNTER — Encounter (INDEPENDENT_AMBULATORY_CARE_PROVIDER_SITE_OTHER): Payer: Self-pay

## 2016-05-15 VITALS — BP 144/61 | HR 60 | Temp 98.4°F | Ht 67.0 in | Wt 210.7 lb

## 2016-05-15 DIAGNOSIS — Z87891 Personal history of nicotine dependence: Secondary | ICD-10-CM

## 2016-05-15 DIAGNOSIS — I10 Essential (primary) hypertension: Secondary | ICD-10-CM | POA: Diagnosis not present

## 2016-05-15 DIAGNOSIS — M25579 Pain in unspecified ankle and joints of unspecified foot: Secondary | ICD-10-CM

## 2016-05-15 DIAGNOSIS — R269 Unspecified abnormalities of gait and mobility: Secondary | ICD-10-CM | POA: Diagnosis not present

## 2016-05-15 DIAGNOSIS — R531 Weakness: Secondary | ICD-10-CM

## 2016-05-15 DIAGNOSIS — E1169 Type 2 diabetes mellitus with other specified complication: Secondary | ICD-10-CM | POA: Diagnosis not present

## 2016-05-15 DIAGNOSIS — Z7984 Long term (current) use of oral hypoglycemic drugs: Secondary | ICD-10-CM | POA: Diagnosis not present

## 2016-05-15 DIAGNOSIS — Z79899 Other long term (current) drug therapy: Secondary | ICD-10-CM | POA: Diagnosis not present

## 2016-05-15 DIAGNOSIS — I872 Venous insufficiency (chronic) (peripheral): Secondary | ICD-10-CM | POA: Diagnosis not present

## 2016-05-15 DIAGNOSIS — L299 Pruritus, unspecified: Secondary | ICD-10-CM | POA: Insufficient documentation

## 2016-05-15 DIAGNOSIS — E119 Type 2 diabetes mellitus without complications: Secondary | ICD-10-CM

## 2016-05-15 DIAGNOSIS — E784 Other hyperlipidemia: Secondary | ICD-10-CM

## 2016-05-15 DIAGNOSIS — J4520 Mild intermittent asthma, uncomplicated: Secondary | ICD-10-CM | POA: Diagnosis not present

## 2016-05-15 DIAGNOSIS — Z794 Long term (current) use of insulin: Secondary | ICD-10-CM

## 2016-05-15 DIAGNOSIS — J45909 Unspecified asthma, uncomplicated: Secondary | ICD-10-CM

## 2016-05-15 DIAGNOSIS — E785 Hyperlipidemia, unspecified: Principal | ICD-10-CM

## 2016-05-15 LAB — GLUCOSE, CAPILLARY: Glucose-Capillary: 141 mg/dL — ABNORMAL HIGH (ref 65–99)

## 2016-05-15 MED ORDER — ATORVASTATIN CALCIUM 20 MG PO TABS: 20.0000 mg | ORAL_TABLET | Freq: Every day | ORAL | 1 refills | Status: DC

## 2016-05-15 NOTE — Assessment & Plan Note (Signed)
Well controlled.  Intermittent albuterol use.  Last use > 1 year ago.   Continue prn albuterol.

## 2016-05-15 NOTE — Progress Notes (Signed)
   Subjective:    Patient ID: Alyssa Haynes, female    DOB: [birth year], 71 y.o.   MRN: [id number]  CC: Follow up for DM and HTN  HPI  [name] is a 71yo woman with PMH of chronic venous insufficiency, HTN, reflux, HLD, asthma, DM2 who presents for routine follow up.    [name] reports itching in multiple sites.  She notes that it started on her 5th toe on the right foot approx 2 month ago then spread to her shoulders, lower back, buttocks and axillae and under breast over the past month to 6 weeks.  She has not noticed a rash or wounds.  She notices the itching most when sitting or resting.  She has changed her lotion to lubriderm.  She uses lidocaine cream to calm the itching down.  She uses Tone soap, but recently changed to a lavender soap and she has used gain and downy for years.  She has no new perfumes or furniture.  She was increased on her pravastatin during that time as well.  No one else in the family is having issues.    She is taking her losartan and pravastatin daily. She is also taking her pioglitazone-metformin combo pill.  Last A1C was 7.6.  She has asthma and uses her albuterol inhaler intermittently.    Review of Systems  Constitutional: Negative for appetite change and fatigue.  Respiratory: Negative for cough and choking.   Cardiovascular: Negative for chest pain and leg swelling.  Musculoskeletal: Positive for gait problem (mild LE weakness, bilateral). Negative for arthralgias and back pain.  Skin: Negative for color change, pallor, rash and wound.       Itching  Neurological: Negative for syncope and weakness.       Objective:   Physical Exam  Constitutional: She is oriented to person, place, and time. She appears well-developed and well-nourished. No distress.  HENT:  Head: Normocephalic and atraumatic.  Eyes: Conjunctivae are normal. No scleral icterus.  Cardiovascular: Normal rate and regular rhythm.   No murmur heard. Pulmonary/Chest: Effort  normal and breath sounds normal. No respiratory distress.  Neurological: She is alert and oriented to person, place, and time.  Skin: No rash noted. No erythema.  She has some patches of discoloration due to scratching under the arms.  There is no rash or skin breakdown on back, abdomen, axillae or arms/feet where the itching is occurring.   Psychiatric: She has a normal mood and affect. Her behavior is normal.    Lipid panel today     Assessment & Plan:  RTC in 3 months, sooner if needed

## 2016-05-15 NOTE — Patient Instructions (Signed)
Ms. Pol - -  It was a pleasure to meet you today.   For your cholesterol - please STOP pravastatin and try atorvastatin 20mg  daily.  Please call me if this Rx is too expensive and we will switch to another medication.    For the itching - please try and change your detergents, creams, lotions and bath products to scent free if possible.  This may help.  You can continue the lidocaine cream, but can also add over the counter antihistamine or benadryl cream to see if this helps.  Hopefully with changing your statin, this will help as well.   Thank you!  Please come back to see me in 3 months.

## 2016-05-15 NOTE — Assessment & Plan Note (Signed)
Change pravastatin to atorvastatin to see if this helps with itching.   Check lipid panel.

## 2016-05-15 NOTE — Assessment & Plan Note (Signed)
BP is 141/61 today which is slightly above goal.  She will likely need more BP medication in future.  We had a discussion today about her desire to be on as few medications as possible.  She actually self stopped many of her medications in the last year or two due to frustration with her health.  She is willing to take the losartan.  I spent most of the visit today starting to communicate with her about her medical issues.  She will likely need further control in future, if she will take it.   Plan Continue losartan Add another agent, possibly amlodipine at next visit.   Creatinine on last check was 0.88

## 2016-05-15 NOTE — Assessment & Plan Note (Signed)
She is getting orthotics through sports medicine and is hopeful that she will be able to exercise more once she has them.

## 2016-05-15 NOTE — Assessment & Plan Note (Addendum)
A1C 7.6 LDL - check today BP - slightly above goal, see above Cr 0.88 - on arb Eye - schedule at next visit Foot - at next visit.    Alyssa Haynes told me today that she took herself off of insulin in the past couple of years as she did not like how it made her feel.   Plan Continue pioglitazone-metformin today Schedule routine screenings at next visit including eye and foot exam Check A1C at next visit.

## 2016-05-15 NOTE — Assessment & Plan Note (Signed)
She has been having this for 6 weeks to 1 month.  No specific cause was elicited on discussion with her, however, her pravastatin was increased during that time.   Plan Change to all un scented detergents and soaps Change to atorvastatin Re-evaluate at next visit.

## 2016-05-15 NOTE — Assessment & Plan Note (Signed)
She reports only intermittent issue with this.  Uses compression stockings on occasion.

## 2016-05-16 LAB — LIPID PANEL
Chol/HDL Ratio: 2.3 ratio units (ref 0.0–4.4)
Cholesterol, Total: 143 mg/dL (ref 100–199)
HDL: 61 mg/dL (ref >39)
LDL Calculated: 66 mg/dL (ref 0–99)
Triglycerides: 81 mg/dL (ref 0–149)
VLDL Cholesterol Cal: 16 mg/dL (ref 5–40)

## 2016-05-24 ENCOUNTER — Encounter: Payer: Self-pay | Admitting: Internal Medicine

## 2016-05-24 ENCOUNTER — Encounter: Payer: Medicare Other | Admitting: Family Medicine

## 2016-06-06 ENCOUNTER — Emergency Department (HOSPITAL_COMMUNITY): Payer: Medicare Other

## 2016-06-06 ENCOUNTER — Encounter (HOSPITAL_COMMUNITY): Payer: Self-pay | Admitting: Emergency Medicine

## 2016-06-06 ENCOUNTER — Emergency Department (HOSPITAL_COMMUNITY)
Admission: EM | Admit: 2016-06-06 | Discharge: 2016-06-06 | Disposition: A | Discharge status: Home / Self Care | Payer: Medicare Other | Attending: Emergency Medicine | Admitting: Emergency Medicine

## 2016-06-06 DIAGNOSIS — Z79899 Other long term (current) drug therapy: Secondary | ICD-10-CM | POA: Insufficient documentation

## 2016-06-06 DIAGNOSIS — J45909 Unspecified asthma, uncomplicated: Secondary | ICD-10-CM | POA: Insufficient documentation

## 2016-06-06 DIAGNOSIS — I10 Essential (primary) hypertension: Secondary | ICD-10-CM | POA: Diagnosis not present

## 2016-06-06 DIAGNOSIS — K5792 Diverticulitis of intestine, part unspecified, without perforation or abscess without bleeding: Secondary | ICD-10-CM

## 2016-06-06 DIAGNOSIS — Z87891 Personal history of nicotine dependence: Secondary | ICD-10-CM | POA: Diagnosis not present

## 2016-06-06 DIAGNOSIS — E119 Type 2 diabetes mellitus without complications: Secondary | ICD-10-CM | POA: Diagnosis not present

## 2016-06-06 DIAGNOSIS — R1030 Lower abdominal pain, unspecified: Secondary | ICD-10-CM | POA: Diagnosis present

## 2016-06-06 LAB — URINALYSIS, ROUTINE W REFLEX MICROSCOPIC
Bilirubin Urine: NEGATIVE
Glucose, UA: NEGATIVE mg/dL
Ketones, ur: 15 mg/dL — AB
Leukocytes, UA: NEGATIVE
Nitrite: NEGATIVE
Protein, ur: NEGATIVE mg/dL
Specific Gravity, Urine: >1.03 — ABNORMAL HIGH (ref 1.005–1.030)
pH: 5.5 (ref 5.0–8.0)

## 2016-06-06 LAB — COMPREHENSIVE METABOLIC PANEL
ALT: 38 U/L (ref 14–54)
AST: 46 U/L — ABNORMAL HIGH (ref 15–41)
Albumin: 3.8 g/dL (ref 3.5–5.0)
Alkaline Phosphatase: 183 U/L — ABNORMAL HIGH (ref 38–126)
Anion gap: 8 (ref 5–15)
BUN: 10 mg/dL (ref 6–20)
CO2: 25 mmol/L (ref 22–32)
Calcium: 9.4 mg/dL (ref 8.9–10.3)
Chloride: 106 mmol/L (ref 101–111)
Creatinine, Ser: 0.75 mg/dL (ref 0.44–1.00)
GFR calc Af Amer: >60 mL/min (ref >60)
GFR calc non Af Amer: >60 mL/min (ref >60)
Glucose, Bld: 157 mg/dL — ABNORMAL HIGH (ref 65–99)
Potassium: 3.5 mmol/L (ref 3.5–5.1)
Sodium: 139 mmol/L (ref 135–145)
Total Bilirubin: 1 mg/dL (ref 0.3–1.2)
Total Protein: 7.3 g/dL (ref 6.5–8.1)

## 2016-06-06 LAB — CBC WITH DIFFERENTIAL/PLATELET
Basophils Absolute: 0 10*3/uL (ref 0.0–0.1)
Basophils Relative: 0 %
Eosinophils Absolute: 0.1 10*3/uL (ref 0.0–0.7)
Eosinophils Relative: 1 %
HCT: 36.2 % (ref 36.0–46.0)
Hemoglobin: 11.7 g/dL — ABNORMAL LOW (ref 12.0–15.0)
Lymphocytes Relative: 17 %
Lymphs Abs: 1.4 10*3/uL (ref 0.7–4.0)
MCH: 29.5 pg (ref 26.0–34.0)
MCHC: 32.3 g/dL (ref 30.0–36.0)
MCV: 91.2 fL (ref 78.0–100.0)
Monocytes Absolute: 0.7 10*3/uL (ref 0.1–1.0)
Monocytes Relative: 9 %
Neutro Abs: 6.2 10*3/uL (ref 1.7–7.7)
Neutrophils Relative %: 73 %
Platelets: 257 10*3/uL (ref 150–400)
RBC: 3.97 MIL/uL (ref 3.87–5.11)
RDW: 14.4 % (ref 11.5–15.5)
WBC: 8.4 10*3/uL (ref 4.0–10.5)

## 2016-06-06 LAB — I-STAT CHEM 8, ED
BUN: 11 mg/dL (ref 6–20)
Calcium, Ion: 1.15 mmol/L (ref 1.15–1.40)
Chloride: 105 mmol/L (ref 101–111)
Creatinine, Ser: 0.7 mg/dL (ref 0.44–1.00)
Glucose, Bld: 156 mg/dL — ABNORMAL HIGH (ref 65–99)
HCT: 37 % (ref 36.0–46.0)
Hemoglobin: 12.6 g/dL (ref 12.0–15.0)
Potassium: 3.5 mmol/L (ref 3.5–5.1)
Sodium: 141 mmol/L (ref 135–145)
TCO2: 25 mmol/L (ref 0–100)

## 2016-06-06 LAB — URINALYSIS, MICROSCOPIC (REFLEX): Bacteria, UA: NONE SEEN

## 2016-06-06 MED ORDER — MORPHINE SULFATE (PF) 4 MG/ML IV SOLN
4.0000 mg | Freq: Once | INTRAVENOUS | Status: AC
  Administered 2016-06-06: 4 mg via INTRAVENOUS
  Filled 2016-06-06: qty 1

## 2016-06-06 MED ORDER — SODIUM CHLORIDE 0.9 % IV BOLUS (SEPSIS)
1000.0000 mL | Freq: Once | INTRAVENOUS | Status: AC
  Administered 2016-06-06: 1000 mL via INTRAVENOUS

## 2016-06-06 MED ORDER — ONDANSETRON 4 MG PO TBDP: 4.0000 mg | ORAL_TABLET | Freq: Three times a day (TID) | ORAL | 0 refills | Status: DC | PRN

## 2016-06-06 MED ORDER — IOPAMIDOL (ISOVUE-300) INJECTION 61%
INTRAVENOUS | Status: AC
  Administered 2016-06-06: 100 mL
  Filled 2016-06-06: qty 100

## 2016-06-06 MED ORDER — MORPHINE SULFATE 15 MG PO TABS
15.0000 mg | ORAL_TABLET | ORAL | 0 refills | Status: DC | PRN
Start: 2016-06-06 — End: 2016-09-25

## 2016-06-06 MED ORDER — CIPROFLOXACIN HCL 500 MG PO TABS: 500.0000 mg | ORAL_TABLET | Freq: Two times a day (BID) | ORAL | 0 refills | Status: DC

## 2016-06-06 MED ORDER — METRONIDAZOLE 500 MG PO TABS: 500.0000 mg | ORAL_TABLET | Freq: Two times a day (BID) | ORAL | 0 refills | Status: DC

## 2016-06-06 NOTE — ED Notes (Signed)
Patient transported to CT 

## 2016-06-06 NOTE — ED Provider Notes (Signed)
[facility] DEPT Provider Note   CSN: [id number] Arrival date & time: 06/06/16  6203     History   Chief Complaint Chief Complaint  Patient presents with  . Abdominal Pain    HPI Alyssa Haynes is a 71 y.o. female.  71 yo F with a chief complaint of suprapubic abdominal pain. This is sharp and shooting comes and goes. Denies radiation. Worse with movement and palpation. Also has been having increased urinary frequency and some incontinence. Denies low back pain denies fevers. Denies constipation or diarrhea. Prior cholecystectomy and C-section.   The history is provided by the patient.  Abdominal Pain   This is a new problem. The current episode started yesterday. The problem occurs constantly. The problem has not changed since onset.The pain is located in the suprapubic region. The quality of the pain is sharp and shooting. The pain is at a severity of 7/10. The pain is moderate. Pertinent negatives include fever, nausea, vomiting, constipation, dysuria, headaches, arthralgias and myalgias. Nothing aggravates the symptoms. Nothing relieves the symptoms.    Past Medical History:  Diagnosis Date  . Asthma   . Diabetes mellitus   . GERD (gastroesophageal reflux disease)   . Hyperlipidemia   . Hypertension     Patient Active Problem List   Diagnosis Date Noted  . Itching 05/15/2016  . Pain in joint, ankle and foot 05/05/2016  . Flat feet, bilateral 04/25/2016  . Healthcare maintenance 04/01/2016  . Chronic venous insufficiency 03/04/2014  . Occipital neuralgia 08/06/2012  . Intrinsic asthma 09/02/2008  . T2DM (type 2 diabetes mellitus) ([name]) 03/12/2006  . Hyperlipidemia associated with type 2 diabetes mellitus ([name]) 03/12/2006  . Essential hypertension 03/12/2006    Past Surgical History:  Procedure Laterality Date  . CESAREAN SECTION  1988  . CHOLECYSTECTOMY  2006  . FRACTURE SURGERY  1992   lEFT ANKLE orif  . TUBAL LIGATION  1988    OB History    No  data available       Home Medications    Prior to Admission medications   Medication Sig Start Date End Date Taking? Authorizing Provider  atorvastatin (LIPITOR) 20 MG tablet Take 1 tablet (20 mg total) by mouth daily. 05/15/16 05/15/17 Yes Sid Falcon, MD  cyanocobalamin 500 MCG tablet Take 500 mcg by mouth daily.   Yes Historical Provider, MD  Elderberry 575 MG/5ML SYRP Take 1 drop by mouth daily.   Yes Historical Provider, MD  Ginkgo Biloba (GNP GINGKO BILOBA EXTRACT PO) Take 1 tablet by mouth daily.   Yes Historical Provider, MD  losartan (COZAAR) 50 MG tablet Take 50 mg by mouth daily.  04/11/16  Yes Historical Provider, MD  pioglitazone-metformin (ACTOPLUS MET) 15-850 MG tablet Take 1 tablet by mouth 2 (two) times daily with a meal. 04/01/16  Yes [name], MD  ciprofloxacin (CIPRO) 500 MG tablet Take 1 tablet (500 mg total) by mouth 2 (two) times daily. 06/06/16   [name], DO  metroNIDAZOLE (FLAGYL) 500 MG tablet Take 1 tablet (500 mg total) by mouth 2 (two) times daily. 06/06/16   [name], DO  morphine (MSIR) 15 MG tablet Take 1 tablet (15 mg total) by mouth every 4 (four) hours as needed for severe pain. 06/06/16   [name], DO  ondansetron (ZOFRAN ODT) 4 MG disintegrating tablet Take 1 tablet (4 mg total) by mouth every 8 (eight) hours as needed for nausea or vomiting. 06/06/16   [name], DO    Family  History Family History  Problem Relation Age of Onset  . Cancer Mother 67    lUNG CANCER  . Heart disease Father 41  . Heart attack Father     Social History Social History  Substance Use Topics  . Smoking status: Former Smoker    Quit date: 02/19/1995  . Smokeless tobacco: Never Used     Comment: stopped [id number]  . Alcohol use No     Allergies   Other   Review of Systems Review of Systems  Constitutional: Negative for chills and fever.  HENT: Negative for congestion and rhinorrhea.   Eyes: Negative for redness and visual disturbance.  Respiratory: Negative  for shortness of breath and wheezing.   Cardiovascular: Negative for chest pain and palpitations.  Gastrointestinal: Positive for abdominal pain. Negative for constipation, nausea and vomiting.  Genitourinary: Negative for dysuria and urgency.  Musculoskeletal: Negative for arthralgias and myalgias.  Skin: Negative for pallor and wound.  Neurological: Negative for dizziness and headaches.     Physical Exam Updated Vital Signs BP (!) 149/71   Pulse 78   Temp 98.4 F (36.9 C) (Oral)   Resp 18   SpO2 99%   Physical Exam  Constitutional: She is oriented to person, place, and time. She appears well-developed and well-nourished. No distress.  HENT:  Head: Normocephalic and atraumatic.  Eyes: EOM are normal. Pupils are equal, round, and reactive to light.  Neck: Normal range of motion. Neck supple.  Cardiovascular: Normal rate and regular rhythm.  Exam reveals no gallop and no friction rub.   No murmur heard. Pulmonary/Chest: Effort normal. She has no wheezes. She has no rales.  Abdominal: Soft. She exhibits no distension and no mass. There is tenderness (worst to the suprapubic region). There is no rebound and no guarding.  Musculoskeletal: She exhibits no edema or tenderness.  Neurological: She is alert and oriented to person, place, and time.  Skin: Skin is warm and dry. She is not diaphoretic.  Psychiatric: She has a normal mood and affect. Her behavior is normal.  Nursing note and vitals reviewed.    ED Treatments / Results  Labs (all labs ordered are listed, but only abnormal results are displayed) Labs Reviewed  URINALYSIS, ROUTINE W REFLEX MICROSCOPIC - Abnormal; Notable for the following:       Result Value   Specific Gravity, Urine >1.030 (*)    Hgb urine dipstick TRACE (*)    Ketones, ur 15 (*)    All other components within normal limits  CBC WITH DIFFERENTIAL/PLATELET - Abnormal; Notable for the following:    Hemoglobin 11.7 (*)    All other components within  normal limits  COMPREHENSIVE METABOLIC PANEL - Abnormal; Notable for the following:    Glucose, Bld 157 (*)    AST 46 (*)    Alkaline Phosphatase 183 (*)    All other components within normal limits  URINALYSIS, MICROSCOPIC (REFLEX) - Abnormal; Notable for the following:    Squamous Epithelial / LPF 0-5 (*)    All other components within normal limits  I-STAT CHEM 8, ED - Abnormal; Notable for the following:    Glucose, Bld 156 (*)    All other components within normal limits  URINE CULTURE    EKG  EKG Interpretation None       Radiology Ct Abdomen Pelvis W Contrast  Result Date: 06/06/2016 CLINICAL DATA:  71 year old female with history of lower abdominal pain and urinary incontinence for the past 2 days. EXAM: CT ABDOMEN  AND PELVIS WITH CONTRAST TECHNIQUE: Multidetector CT imaging of the abdomen and pelvis was performed using the standard protocol following bolus administration of intravenous contrast. CONTRAST:  121m ISOVUE-300 IOPAMIDOL (ISOVUE-300) INJECTION 61% COMPARISON:  No priors. FINDINGS: Lower chest: Calcifications of the aortic valve. Atherosclerotic calcifications in the right coronary artery. Hepatobiliary: No cystic or solid hepatic lesions. Status post cholecystectomy. Mild intra and extrahepatic biliary ductal dilatation, with the common bile duct measuring up to 12 mm in the porta hepatis, presumably reflective of benign post cholecystectomy physiology. Pancreas: No pancreatic mass. No pancreatic ductal dilatation. No pancreatic or peripancreatic fluid or inflammatory changes. Spleen: Unremarkable. Adrenals/Urinary Tract: Bilateral adrenal glands and bilateral kidneys are normal in appearance. No hydroureteronephrosis. Urinary bladder is normal in appearance. Stomach/Bowel: The appearance of the stomach is normal. There is no pathologic dilatation of small bowel or colon. Extensive inflammatory changes are noted adjacent to the proximal sigmoid colon in an area of  extensive diverticular disease, compatible with acute diverticulitis. There is extensive colonic wall thickening throughout this region, but no discrete diverticular abscess is confidently identified at this time. The appendix is not confidently identified and may be surgically absent. Regardless, there are no inflammatory changes noted adjacent to the cecum to suggest the presence of an acute appendicitis at this time. Vascular/Lymphatic: Aortic atherosclerosis, without evidence of aneurysm or dissection in the abdominal or pelvic vasculature. No lymphadenopathy noted in the abdomen or pelvis. Reproductive: Uterus in right ovary are unremarkable in appearance. 2.9 cm low-attenuation lesion in the left ovary is presumably a small cyst. Other: No significant volume of ascites.  No pneumoperitoneum. Musculoskeletal: There are no aggressive appearing lytic or blastic lesions noted in the visualized portions of the skeleton. IMPRESSION: 1. Inflammatory changes adjacent to the proximal sigmoid colon, compatible with an acute diverticulitis. No discrete diverticular abscess or findings of frank perforation are noted at this time. There is some colonic wall thickening in this region. Follow-up evaluation with nonemergent colonoscopy is recommended after resolution of the patient's acute illness to exclude underlying neoplasm. 2. **An incidental finding of potential clinical significance has been found. 2.9 cm low-attenuation lesion in the left ovary is favored to represent an ovarian cyst, but should be further evaluated with nonemergent pelvic ultrasound in this postmenopausal female. This recommendation follows ACR consensus guidelines: White Paper of the ACR Incidental Findings Committee II on Adnexal Findings. J Am Coll Radiol 2410 739 1177** 3. Aortic atherosclerosis. Electronically Signed   By: DVinnie LangtonM.D.   On: 06/06/2016 11:34    Procedures Procedures (including critical care time)  Medications  Ordered in ED Medications  sodium chloride 0.9 % bolus 1,000 mL (0 mLs Intravenous Stopped 06/06/16 1218)  morphine 4 MG/ML injection 4 mg (4 mg Intravenous Given 06/06/16 1001)  iopamidol (ISOVUE-300) 61 % injection (100 mLs  Contrast Given 06/06/16 1047)     Initial Impression / Assessment and Plan / ED Course  I have reviewed the triage vital signs and the nursing notes.  Pertinent labs & imaging results that were available during my care of the patient were reviewed by me and considered in my medical decision making (see chart for details).     71yo F with a chief complaint of suprapubic abdominal pain. Going on since yesterday. Associated with urinary symptoms. Suspect urinary tract infection based on history however patient is having some significant tenderness on exam will obtain a CT scan.  CT with acute diverticulitis. UA negative for infection. Patient feeling much better on reassessment. History  of antibiotics. PCP follow-up.  12:32 PM:  I have discussed the diagnosis/risks/treatment options with the patient and family and believe the pt to be eligible for discharge home to follow-up with PCP. We also discussed returning to the ED immediately if new or worsening sx occur. We discussed the sx which are most concerning (e.g., sudden worsening pain, fever, inability to tolerate by mouth) that necessitate immediate return. Medications administered to the patient during their visit and any new prescriptions provided to the patient are listed below.  Medications given during this visit Medications  sodium chloride 0.9 % bolus 1,000 mL (0 mLs Intravenous Stopped 06/06/16 1218)  morphine 4 MG/ML injection 4 mg (4 mg Intravenous Given 06/06/16 1001)  iopamidol (ISOVUE-300) 61 % injection (100 mLs  Contrast Given 06/06/16 1047)     The patient appears reasonably screen and/or stabilized for discharge and I doubt any other medical condition or other [facility] requiring further screening,  evaluation, or treatment in the ED at this time prior to discharge.    Final Clinical Impressions(s) / ED Diagnoses   Final diagnoses:  Diverticulitis    New Prescriptions Discharge Medication List as of 06/06/2016 12:21 PM    START taking these medications   Details  ciprofloxacin (CIPRO) 500 MG tablet Take 1 tablet (500 mg total) by mouth 2 (two) times daily., Starting Thu 06/06/2016, Print    metroNIDAZOLE (FLAGYL) 500 MG tablet Take 1 tablet (500 mg total) by mouth 2 (two) times daily., Starting Thu 06/06/2016, Print    morphine (MSIR) 15 MG tablet Take 1 tablet (15 mg total) by mouth every 4 (four) hours as needed for severe pain., Starting Thu 06/06/2016, Print    ondansetron (ZOFRAN ODT) 4 MG disintegrating tablet Take 1 tablet (4 mg total) by mouth every 8 (eight) hours as needed for nausea or vomiting., Starting Thu 06/06/2016, [place], DO 06/06/16 1232

## 2016-06-06 NOTE — Discharge Instructions (Signed)
Follow up with your PCP, return for worsening symptoms.  °

## 2016-06-06 NOTE — ED Triage Notes (Signed)
Pt here for lower abd pain and urinary urgency; pt sts generalized weakness

## 2016-06-07 LAB — URINE CULTURE: Culture: NO GROWTH

## 2016-06-25 ENCOUNTER — Encounter: Payer: Self-pay | Admitting: Internal Medicine

## 2016-07-03 ENCOUNTER — Ambulatory Visit: Payer: Medicare Other | Admitting: Internal Medicine

## 2016-07-10 ENCOUNTER — Ambulatory Visit (INDEPENDENT_AMBULATORY_CARE_PROVIDER_SITE_OTHER): Payer: Medicare Other | Admitting: Internal Medicine

## 2016-07-10 ENCOUNTER — Encounter: Payer: Self-pay | Admitting: Internal Medicine

## 2016-07-10 VITALS — BP 138/61 | HR 74 | Temp 98.3°F | Ht 67.0 in | Wt 201.0 lb

## 2016-07-10 DIAGNOSIS — I10 Essential (primary) hypertension: Secondary | ICD-10-CM

## 2016-07-10 DIAGNOSIS — R5383 Other fatigue: Secondary | ICD-10-CM | POA: Insufficient documentation

## 2016-07-10 DIAGNOSIS — Z7984 Long term (current) use of oral hypoglycemic drugs: Secondary | ICD-10-CM | POA: Diagnosis not present

## 2016-07-10 DIAGNOSIS — Z5189 Encounter for other specified aftercare: Secondary | ICD-10-CM | POA: Diagnosis not present

## 2016-07-10 DIAGNOSIS — Z794 Long term (current) use of insulin: Principal | ICD-10-CM

## 2016-07-10 DIAGNOSIS — N83209 Unspecified ovarian cyst, unspecified side: Secondary | ICD-10-CM | POA: Insufficient documentation

## 2016-07-10 DIAGNOSIS — Z79899 Other long term (current) drug therapy: Secondary | ICD-10-CM | POA: Diagnosis not present

## 2016-07-10 DIAGNOSIS — Z87891 Personal history of nicotine dependence: Secondary | ICD-10-CM | POA: Diagnosis not present

## 2016-07-10 DIAGNOSIS — E119 Type 2 diabetes mellitus without complications: Secondary | ICD-10-CM | POA: Diagnosis not present

## 2016-07-10 DIAGNOSIS — N83202 Unspecified ovarian cyst, left side: Secondary | ICD-10-CM

## 2016-07-10 HISTORY — DX: Unspecified ovarian cyst, unspecified side: N83.209

## 2016-07-10 LAB — POCT GLYCOSYLATED HEMOGLOBIN (HGB A1C): Hemoglobin A1C: 7.4

## 2016-07-10 LAB — GLUCOSE, CAPILLARY: Glucose-Capillary: 144 mg/dL — ABNORMAL HIGH (ref 65–99)

## 2016-07-10 NOTE — Assessment & Plan Note (Signed)
Symptoms seem unlikely to be related to anemia or thyroid disease, but it would be reasonable to screen for these.  If these are normal, would possibly screen for OSA or sleep related disorder given her reported symptoms.   Plan Check TSH, CBC, LFTs today.

## 2016-07-10 NOTE — Assessment & Plan Note (Signed)
A1C is 7.4 today which is slightly improved.  She is trying to eat better.  She does not check her BS unless she is not feeling well which is only occasionally. She has not had any low blood sugars.  Her BP today was 138/61, she has an LDL of 66 and is on atorvastatin.  At last check her renal function was stable and she is on losartan.  Foot exam a few visits ago showed no neuropathy.  She will get results from an eye exam she had about 2-3 months ago.   Plan She is up to date on monitoring Continue pioglitazone-metformin Encouraged exercise and diet to get to A1C goal of < 7

## 2016-07-10 NOTE — Progress Notes (Signed)
   Subjective:    Patient ID: Alyssa Haynes, female    DOB: 1945-09-18, 71 y.o.   MRN: [id number]  CC: ER visit follow up.   HPI  Alyssa Haynes is a 71yo woman with PMH of HTN, DM2, asthma who presents for follow up after being seen in the ED.    She was seen in the ED in April for abdominal pain.  She was treated for diverticulitis with cipro and flagyl.  She had a CT scan of the abdomen which had an incidental finding of an ovarian cyst.  The radiologist recommended follow up of this with a pelvic ultrasound for better view of the cyst.  I personally reviewed the ED documentation and CT scan.   Today, Alyssa Haynes reports that she didn't take the Abx prescribed to her from the ED, except for a few doses.  She was not convinced she had diverticulitis.  She reports that the abdominal pain resolved after the morphine in the ED and then came back yesterday and again resolved with a half tab of morphine which she received from the ED.  She has not had constant pain, no fevers, nausea, vomiting, change in bowel habits, just the intermittent sharp left lower quadrant abdominal pain that is now absent.    As for her ovarian cyst, she had one before many years ago and doesn't remember exactly the presenting symptoms.  She notes occasional bloating, some early satiety, but no weight loss.  She does take tums for occasional gas.    The main issue she wanted to discuss today was 1 year of fatigue.  She describes her symptoms as lack of energy, nonrestorative sleep, dragging during the day, but still doing things she enjoys. She denies anhedonia.  She does have some hair thinning and itching which she thinks may be from dry skin.  She is colder, but this has been going on for a long time.  Last TSH was normal in 2010.  She has not had weight gain.    Review of Systems  Constitutional: Positive for fatigue. Negative for activity change, chills, fever and unexpected weight change.  Eyes: Negative for  photophobia and visual disturbance.  Cardiovascular: Negative for chest pain and leg swelling.  Gastrointestinal: Positive for abdominal pain (left sided, intermittent). Negative for blood in stool, diarrhea, nausea and vomiting.  Skin:       Itching  Neurological: Negative for dizziness, weakness and light-headedness.       Objective:   Physical Exam  Constitutional: She is oriented to person, place, and time. She appears well-developed and well-nourished. No distress.  HENT:  Head: Normocephalic and atraumatic.  Eyes:  Mild conjunctival pallor  Cardiovascular: Normal rate, regular rhythm and normal heart sounds.   No murmur heard. Pulmonary/Chest: Effort normal and breath sounds normal. No respiratory distress. She has no wheezes.  Abdominal: Soft. Bowel sounds are normal. She exhibits no distension and no mass. There is no tenderness. There is no guarding.  Neurological: She is alert and oriented to person, place, and time.  Skin: Skin is warm and dry. No rash noted.  Psychiatric: She has a normal mood and affect. Her behavior is normal.     CMET, CBC, TSH today.     Assessment & Plan:  RTC in 3 months, sooner if needed.

## 2016-07-10 NOTE — Assessment & Plan Note (Signed)
I am curious if her abdominal pain on the left could be better attributed to her known cyst.  It is only intermittent in nature and I do not think it is related to diverticulitis at this time.  She did not complete therapy for diverticulitis and is much improved (she was diagnosed over 1 month ago).   Plan Pelvic/TV ultrasound to evaluate cyst Follow up pending this study Continue pain management if pain recurs.

## 2016-07-10 NOTE — Assessment & Plan Note (Signed)
BP improved today to 138/61.  She would have a goal of < 130/80 in future.  She is not amenable to medication changes today.  Will discuss low dose amlodipine as an augmentation at next visit if she is amenable.   Plan Continue losartan.

## 2016-07-10 NOTE — Patient Instructions (Addendum)
Alyssa Haynes - -  Thank you for coming in to see me today.    For your diabetes, please keep working on dietary changes.  You have improved your A1C to 7.4 which is great!  For your fatigue, I will check some blood work today and let you know the results in the next week.   For your ovarian cyst, please schedule an ultrasound at your convenience.    Thank you!   Ovarian Cyst An ovarian cyst is a fluid-filled sac on an ovary. The ovaries are organs that make eggs in women. Most ovarian cysts go away on their own and are not cancerous (are benign). Some cysts need treatment. Follow these instructions at home:  Take over-the-counter and prescription medicines only as told by your doctor.  Do not drive or use heavy machinery while taking prescription pain medicine.  Get pelvic exams and Pap tests as often as told by your doctor.  Return to your normal activities as told by your doctor. Ask your doctor what activities are safe for you.  Do not use any products that contain nicotine or tobacco, such as cigarettes and e-cigarettes. If you need help quitting, ask your doctor.  Keep all follow-up visits as told by your doctor. This is important. Contact a doctor if:  Your periods are:  Late.  Irregular.  Painful.  Your periods stop.  You have pelvic pain that does not go away.  You have pressure on your bladder.  You have trouble making your bladder empty when you pee (urinate).  You have pain during sex.  You have any of the following in your belly (abdomen):  A feeling of fullness.  Pressure.  Discomfort.  Pain that does not go away.  Swelling.  You feel sick most of the time.  You have trouble pooping (have constipation).  You are not as hungry as usual (you lose your appetite).  You get very bad acne.  You start to have more hair on your body and face.  You are gaining weight or losing weight without changing your exercise and eating habits.  You  think you may be pregnant. Get help right away if:  You have belly pain that is very bad or gets worse.  You cannot eat or drink without throwing up (vomiting).  You suddenly get a fever.  Your period is a lot heavier than usual. This information is not intended to replace advice given to you by your health care provider. Make sure you discuss any questions you have with your health care provider. Document Released: 07/24/2007 Document Revised: 08/25/2015 Document Reviewed: 07/09/2015 Elsevier Interactive Patient Education  2017 [employer].

## 2016-07-11 LAB — CMP14 + ANION GAP
ALT: 50 IU/L — ABNORMAL HIGH (ref 0–32)
AST: 76 IU/L — ABNORMAL HIGH (ref 0–40)
Albumin/Globulin Ratio: 1.3 (ref 1.2–2.2)
Albumin: 4 g/dL (ref 3.6–4.8)
Alkaline Phosphatase: 234 IU/L — ABNORMAL HIGH (ref 39–117)
Anion Gap: 16 mmol/L (ref 10.0–18.0)
BUN/Creatinine Ratio: 11 — ABNORMAL LOW (ref 12–28)
BUN: 11 mg/dL (ref 8–27)
Bilirubin Total: 0.8 mg/dL (ref 0.0–1.2)
CO2: 23 mmol/L (ref 18–29)
Calcium: 9.4 mg/dL (ref 8.7–10.3)
Chloride: 102 mmol/L (ref 96–106)
Creatinine, Ser: 0.98 mg/dL (ref 0.57–1.00)
GFR calc Af Amer: 69 mL/min/{1.73_m2} (ref >59)
GFR calc non Af Amer: 59 mL/min/{1.73_m2} — ABNORMAL LOW (ref >59)
Globulin, Total: 3 g/dL (ref 1.5–4.5)
Glucose: 157 mg/dL — ABNORMAL HIGH (ref 65–99)
Potassium: 4 mmol/L (ref 3.5–5.2)
Sodium: 141 mmol/L (ref 134–144)
Total Protein: 7 g/dL (ref 6.0–8.5)

## 2016-07-11 LAB — CBC WITH DIFFERENTIAL/PLATELET
Basophils Absolute: 0 10*3/uL (ref 0.0–0.2)
Basos: 0 %
EOS (ABSOLUTE): 0.2 10*3/uL (ref 0.0–0.4)
Eos: 3 %
Hematocrit: 35.9 % (ref 34.0–46.6)
Hemoglobin: 11.7 g/dL (ref 11.1–15.9)
Immature Grans (Abs): 0 10*3/uL (ref 0.0–0.1)
Immature Granulocytes: 0 %
Lymphocytes Absolute: 1.3 10*3/uL (ref 0.7–3.1)
Lymphs: 26 %
MCH: 29.7 pg (ref 26.6–33.0)
MCHC: 32.6 g/dL (ref 31.5–35.7)
MCV: 91 fL (ref 79–97)
Monocytes Absolute: 0.6 10*3/uL (ref 0.1–0.9)
Monocytes: 11 %
Neutrophils Absolute: 3.1 10*3/uL (ref 1.4–7.0)
Neutrophils: 60 %
Platelets: 243 10*3/uL (ref 150–379)
RBC: 3.94 x10E6/uL (ref 3.77–5.28)
RDW: 14.3 % (ref 12.3–15.4)
WBC: 5.2 10*3/uL (ref 3.4–10.8)

## 2016-07-11 LAB — TSH: TSH: 3.17 u[IU]/mL (ref 0.450–4.500)

## 2016-07-16 ENCOUNTER — Ambulatory Visit (HOSPITAL_COMMUNITY)
Admission: RE | Admit: 2016-07-16 | Discharge: 2016-07-16 | Disposition: A | Discharge status: Home / Self Care | Payer: Medicare Other | Source: Ambulatory Visit | Attending: Internal Medicine | Admitting: Internal Medicine

## 2016-07-16 ENCOUNTER — Encounter: Payer: Self-pay | Admitting: Internal Medicine

## 2016-07-16 DIAGNOSIS — N83202 Unspecified ovarian cyst, left side: Secondary | ICD-10-CM | POA: Diagnosis present

## 2016-08-06 DIAGNOSIS — Z124 Encounter for screening for malignant neoplasm of cervix: Secondary | ICD-10-CM | POA: Diagnosis not present

## 2016-08-06 DIAGNOSIS — R938 Abnormal findings on diagnostic imaging of other specified body structures: Secondary | ICD-10-CM | POA: Diagnosis not present

## 2016-08-07 ENCOUNTER — Ambulatory Visit: Payer: Medicare Other | Admitting: Internal Medicine

## 2016-08-11 LAB — GLUCOSE, POCT (MANUAL RESULT ENTRY): POC Glucose: 202 mg/dl — AB (ref 70–99)

## 2016-08-16 DIAGNOSIS — R938 Abnormal findings on diagnostic imaging of other specified body structures: Secondary | ICD-10-CM | POA: Diagnosis not present

## 2016-08-27 DIAGNOSIS — R1032 Left lower quadrant pain: Secondary | ICD-10-CM | POA: Diagnosis not present

## 2016-08-27 DIAGNOSIS — K5730 Diverticulosis of large intestine without perforation or abscess without bleeding: Secondary | ICD-10-CM | POA: Diagnosis not present

## 2016-09-16 DIAGNOSIS — M899 Disorder of bone, unspecified: Secondary | ICD-10-CM | POA: Diagnosis not present

## 2016-09-16 DIAGNOSIS — M7542 Impingement syndrome of left shoulder: Secondary | ICD-10-CM | POA: Diagnosis not present

## 2016-09-20 ENCOUNTER — Other Ambulatory Visit (INDEPENDENT_AMBULATORY_CARE_PROVIDER_SITE_OTHER): Payer: Medicare Other

## 2016-09-20 DIAGNOSIS — G8929 Other chronic pain: Secondary | ICD-10-CM | POA: Diagnosis not present

## 2016-09-20 DIAGNOSIS — M25512 Pain in left shoulder: Secondary | ICD-10-CM | POA: Diagnosis not present

## 2016-09-21 LAB — BMP8+ANION GAP
Anion Gap: 14 mmol/L (ref 10.0–18.0)
BUN/Creatinine Ratio: 11 — ABNORMAL LOW (ref 12–28)
BUN: 11 mg/dL (ref 8–27)
CO2: 24 mmol/L (ref 20–29)
Calcium: 9.5 mg/dL (ref 8.7–10.3)
Chloride: 106 mmol/L (ref 96–106)
Creatinine, Ser: 0.98 mg/dL (ref 0.57–1.00)
GFR calc Af Amer: 68 mL/min/{1.73_m2} (ref >59)
GFR calc non Af Amer: 59 mL/min/{1.73_m2} — ABNORMAL LOW (ref >59)
Glucose: 164 mg/dL — ABNORMAL HIGH (ref 65–99)
Potassium: 4.1 mmol/L (ref 3.5–5.2)
Sodium: 144 mmol/L (ref 134–144)

## 2016-09-23 NOTE — Progress Notes (Signed)
Lab results faxed to Dr Rhina Brackett at [facility]   [phone]   09-23-2016  10:22am  Maryan Rued, PBT

## 2016-09-25 ENCOUNTER — Encounter: Payer: Self-pay | Admitting: Internal Medicine

## 2016-09-25 ENCOUNTER — Ambulatory Visit (INDEPENDENT_AMBULATORY_CARE_PROVIDER_SITE_OTHER): Payer: Medicare Other | Admitting: Internal Medicine

## 2016-09-25 VITALS — BP 154/66 | HR 64 | Temp 98.0°F | Ht 67.0 in | Wt 208.1 lb

## 2016-09-25 DIAGNOSIS — J45909 Unspecified asthma, uncomplicated: Secondary | ICD-10-CM

## 2016-09-25 DIAGNOSIS — Z794 Long term (current) use of insulin: Principal | ICD-10-CM

## 2016-09-25 DIAGNOSIS — Z79899 Other long term (current) drug therapy: Secondary | ICD-10-CM | POA: Diagnosis not present

## 2016-09-25 DIAGNOSIS — I10 Essential (primary) hypertension: Secondary | ICD-10-CM | POA: Diagnosis not present

## 2016-09-25 DIAGNOSIS — N3946 Mixed incontinence: Secondary | ICD-10-CM | POA: Diagnosis not present

## 2016-09-25 DIAGNOSIS — Z7984 Long term (current) use of oral hypoglycemic drugs: Secondary | ICD-10-CM | POA: Diagnosis not present

## 2016-09-25 DIAGNOSIS — N83209 Unspecified ovarian cyst, unspecified side: Secondary | ICD-10-CM | POA: Diagnosis not present

## 2016-09-25 DIAGNOSIS — E785 Hyperlipidemia, unspecified: Secondary | ICD-10-CM

## 2016-09-25 DIAGNOSIS — N83202 Unspecified ovarian cyst, left side: Secondary | ICD-10-CM

## 2016-09-25 DIAGNOSIS — N3941 Urge incontinence: Secondary | ICD-10-CM | POA: Insufficient documentation

## 2016-09-25 DIAGNOSIS — Z87891 Personal history of nicotine dependence: Secondary | ICD-10-CM | POA: Diagnosis not present

## 2016-09-25 DIAGNOSIS — E119 Type 2 diabetes mellitus without complications: Secondary | ICD-10-CM

## 2016-09-25 LAB — GLUCOSE, CAPILLARY: Glucose-Capillary: 152 mg/dL — ABNORMAL HIGH (ref 65–99)

## 2016-09-25 LAB — POCT GLYCOSYLATED HEMOGLOBIN (HGB A1C): Hemoglobin A1C: 7.6

## 2016-09-25 NOTE — Assessment & Plan Note (Signed)
Following with Dr. Garwin Brothers in Gynecology.  Pain has improved and she has planned follow up with ultrasound.

## 2016-09-25 NOTE — Assessment & Plan Note (Signed)
I think she has mixed issues, but with urge being the predominant issue.  We discussed lifestyle changes including timed toileting, decreasing fluid intake 4 hours before bed and kegel exercises.  She is not interested in medications at this time.   Plan Lifestyle changes.

## 2016-09-25 NOTE — Assessment & Plan Note (Signed)
A1C today is 7.6.  She is taking her medication without issue.  We discussed medication vs. Lifestyle changes today at length.  She would like to try intensive lifestyle changes first.  We discussed carbohydrates and how to avoid/reduce intake of refined sugars.  She is motivated to do this.  We further discussed newer DM medications which could also help with weight loss.  She would like to try lifestyle first.   Renal function stable, last checked 8/3 She is due for a eye appointment, will remind her at next visit.   Plan Continue pioglitazone-metformin Intensive diet changes, gave information to her today.

## 2016-09-25 NOTE — Assessment & Plan Note (Signed)
Blood pressure is elevated today.  She has not taken her losartan because she did not eat today.    Plan Continue losartan, emphasized taking this daily.

## 2016-09-25 NOTE — Patient Instructions (Signed)
[name] - -  Thank you for coming in to see me today.   For your diabetes, we discussed diet changes to help bring your A1C down.  More information below.   For your urinary issues, please try decreasing fluids before bedtime and Kegel exercises.  More information below.   Diabetes Mellitus and Food It is important for you to manage your blood sugar (glucose) level. Your blood glucose level can be greatly affected by what you eat. Eating healthier foods in the appropriate amounts throughout the day at about the same time each day will help you control your blood glucose level. It can also help slow or prevent worsening of your diabetes mellitus. Healthy eating may even help you improve the level of your blood pressure and reach or maintain a healthy weight. General recommendations for healthful eating and cooking habits include:  Eating meals and snacks regularly. Avoid going long periods of time without eating to lose weight.  Eating a diet that consists mainly of plant-based foods, such as fruits, vegetables, nuts, legumes, and whole grains.  Using low-heat cooking methods, such as baking, instead of high-heat cooking methods, such as deep frying.  Work with your dietitian to make sure you understand how to use the Nutrition Facts information on food labels. How can food affect me? Carbohydrates Carbohydrates affect your blood glucose level more than any other type of food. Your dietitian will help you determine how many carbohydrates to eat at each meal and teach you how to count carbohydrates. Counting carbohydrates is important to keep your blood glucose at a healthy level, especially if you are using insulin or taking certain medicines for diabetes mellitus. Alcohol Alcohol can cause sudden decreases in blood glucose (hypoglycemia), especially if you use insulin or take certain medicines for diabetes mellitus. Hypoglycemia can be a life-threatening condition. Symptoms of hypoglycemia  (sleepiness, dizziness, and disorientation) are similar to symptoms of having too much alcohol. If your health care provider has given you approval to drink alcohol, do so in moderation and use the following guidelines:  Women should not have more than one drink per day, and men should not have more than two drinks per day. One drink is equal to: ? 12 oz of beer. ? 5 oz of wine. ? 1 oz of hard liquor.  Do not drink on an empty stomach.  Keep yourself hydrated. Have water, diet soda, or unsweetened iced tea.  Regular soda, juice, and other mixers might contain a lot of carbohydrates and should be counted.  What foods are not recommended? As you make food choices, it is important to remember that all foods are not the same. Some foods have fewer nutrients per serving than other foods, even though they might have the same number of calories or carbohydrates. It is difficult to get your body what it needs when you eat foods with fewer nutrients. Examples of foods that you should avoid that are high in calories and carbohydrates but low in nutrients include:  Trans fats (most processed foods list trans fats on the Nutrition Facts label).  Regular soda.  Juice.  Candy.  Sweets, such as cake, pie, doughnuts, and cookies.  Fried foods.  What foods can I eat? Eat nutrient-rich foods, which will nourish your body and keep you healthy. The food you should eat also will depend on several factors, including:  The calories you need.  The medicines you take.  Your weight.  Your blood glucose level.  Your blood pressure level.  Your cholesterol level.  You should eat a variety of foods, including:  Protein. ? Lean cuts of meat. ? Proteins low in saturated fats, such as fish, egg whites, and beans. Avoid processed meats.  Fruits and vegetables. ? Fruits and vegetables that may help control blood glucose levels, such as apples, mangoes, and yams.  Dairy products. ? Choose  fat-free or low-fat dairy products, such as milk, yogurt, and cheese.  Grains, bread, pasta, and rice. ? Choose whole grain products, such as multigrain bread, whole oats, and brown rice. These foods may help control blood pressure.  Fats. ? Foods containing healthful fats, such as nuts, avocado, olive oil, canola oil, and fish.  Does everyone with diabetes mellitus have the same meal plan? Because every person with diabetes mellitus is different, there is not one meal plan that works for everyone. It is very important that you meet with a dietitian who will help you create a meal plan that is just right for you. This information is not intended to replace advice given to you by your health care provider. Make sure you discuss any questions you have with your health care provider. Document Released: 11/01/2004 Document Revised: 07/13/2015 Document Reviewed: 01/01/2013 Elsevier Interactive Patient Education  2017 [employer].   Kegel Exercises Kegel exercises help strengthen the muscles that support the rectum, vagina, small intestine, bladder, and uterus. Doing Kegel exercises can help:  Improve bladder and bowel control.  Improve sexual response.  Reduce problems and discomfort during pregnancy.  Kegel exercises involve squeezing your pelvic floor muscles, which are the same muscles you squeeze when you try to stop the flow of urine. The exercises can be done while sitting, standing, or lying down, but it is best to vary your position. Phase 1 exercises 1. Squeeze your pelvic floor muscles tight. You should feel a tight lift in your rectal area. If you are a female, you should also feel a tightness in your vaginal area. Keep your stomach, buttocks, and legs relaxed. 2. Hold the muscles tight for up to 10 seconds. 3. Relax your muscles. Repeat this exercise 50 times a day or as many times as told by your health care provider. Continue to do this exercise for at least 4-6 weeks or for  as long as told by your health care provider. This information is not intended to replace advice given to you by your health care provider. Make sure you discuss any questions you have with your health care provider. Document Released: 01/22/2012 Document Revised: 09/30/2015 Document Reviewed: 12/25/2014 Elsevier Interactive Patient Education  [employer].

## 2016-09-25 NOTE — Progress Notes (Signed)
   Subjective:    Patient ID: Alyssa Haynes, female    DOB: 12-May-1945, 71 y.o.   MRN: [id number]  CC: 3 month follow up for HTN, DM  HPI  Alyssa Haynes is a 71yo woman with PMH of HTN, asthma, DM2, HLD who presents for routine follow up.   Today, Alyssa Haynes's A1C was rising to 7.6.  She is compliant with her pioglitazone-metformin combo pill.  She reports no symptoms of low blood sugars.  We talked about ways to further decrease her blood sugar including new medications vs.  A focus on diet and lifestyle.  She does not check her BS and did not bring in a log.  She reports eating potatoes, rice, pasta as part of the issue.  She does not drink sugary drinks or sodas.   She had an acute issue today which has been going on for a few months.  She notes urinary incontinence which is causing an issue.  She notes her main symptoms are at night.  She will get up to go to the bathroom and feel the urge to go, but not be able to make it to the bathroom on time.  She will have leakage on the bathroom mat.  She notes no real issues during the day.  She will sometimes have a small loss of urine with coughing or laughing and sometimes have a small loss that was unknown to her.  She wears daily panty-liners.  Her main issues appears urge incontinence with some mixed incontinence.  She has no dysuria, pelvic pain, discharge, other changes in urinary habits.  She has no abdominal pain.    She has a history of issues with her shoulder.  She follows with [facility] and she is getting a shoulder MRI for further work up.   She follows with Dr. Garwin Brothers for an ovarian cyst.  She is due for follow up with another ultrasound soon in that clinic.     Review of Systems  Constitutional: Negative for activity change and chills.  Respiratory: Negative for cough, choking and shortness of breath.   Cardiovascular: Negative for chest pain and leg swelling.  Genitourinary: Positive for urgency. Negative for  difficulty urinating, dysuria, enuresis, frequency, hematuria, menstrual problem, pelvic pain, vaginal bleeding, vaginal discharge and vaginal pain.  Neurological: Negative for dizziness.       Objective:   Physical Exam  Constitutional: She is oriented to person, place, and time. She appears well-developed and well-nourished. No distress.  Eyes: Conjunctivae are normal. No scleral icterus.  Cardiovascular: Normal rate, regular rhythm and normal heart sounds.   No murmur heard. Pulmonary/Chest: Effort normal and breath sounds normal. No respiratory distress. She has no wheezes.  Abdominal: Soft. Bowel sounds are normal. She exhibits no distension and no mass. There is no tenderness. There is no rebound.  Neurological: She is alert and oriented to person, place, and time.  Psychiatric: She has a normal mood and affect. Her behavior is normal.  Vitals reviewed.   No labs today      Assessment & Plan:  RTC in 3 months.

## 2016-09-27 DIAGNOSIS — M25512 Pain in left shoulder: Secondary | ICD-10-CM | POA: Diagnosis not present

## 2016-09-30 ENCOUNTER — Encounter: Payer: Self-pay | Admitting: Internal Medicine

## 2016-09-30 ENCOUNTER — Ambulatory Visit (HOSPITAL_COMMUNITY)
Admission: RE | Admit: 2016-09-30 | Discharge: 2016-09-30 | Disposition: A | Discharge status: Home / Self Care | Payer: Medicare Other | Source: Ambulatory Visit | Attending: Internal Medicine | Admitting: Internal Medicine

## 2016-09-30 ENCOUNTER — Ambulatory Visit (INDEPENDENT_AMBULATORY_CARE_PROVIDER_SITE_OTHER): Payer: Medicare Other | Admitting: Internal Medicine

## 2016-09-30 VITALS — BP 158/67 | HR 61 | Temp 97.8°F | Ht 67.0 in | Wt 205.8 lb

## 2016-09-30 DIAGNOSIS — Z87891 Personal history of nicotine dependence: Secondary | ICD-10-CM

## 2016-09-30 DIAGNOSIS — Z79899 Other long term (current) drug therapy: Secondary | ICD-10-CM | POA: Diagnosis not present

## 2016-09-30 DIAGNOSIS — W010XXA Fall on same level from slipping, tripping and stumbling without subsequent striking against object, initial encounter: Secondary | ICD-10-CM | POA: Diagnosis not present

## 2016-09-30 DIAGNOSIS — I10 Essential (primary) hypertension: Secondary | ICD-10-CM

## 2016-09-30 DIAGNOSIS — S92412A Displaced fracture of proximal phalanx of left great toe, initial encounter for closed fracture: Secondary | ICD-10-CM

## 2016-09-30 DIAGNOSIS — Y9302 Activity, running: Secondary | ICD-10-CM

## 2016-09-30 DIAGNOSIS — M25561 Pain in right knee: Secondary | ICD-10-CM | POA: Diagnosis not present

## 2016-09-30 DIAGNOSIS — Y9289 Other specified places as the place of occurrence of the external cause: Secondary | ICD-10-CM | POA: Diagnosis not present

## 2016-09-30 DIAGNOSIS — S99922A Unspecified injury of left foot, initial encounter: Secondary | ICD-10-CM

## 2016-09-30 DIAGNOSIS — W19XXXA Unspecified fall, initial encounter: Secondary | ICD-10-CM | POA: Insufficient documentation

## 2016-09-30 MED ORDER — IBUPROFEN 600 MG PO TABS: 600.0000 mg | ORAL_TABLET | Freq: Four times a day (QID) | ORAL | 0 refills | Status: DC | PRN

## 2016-09-30 MED ORDER — DICLOFENAC SODIUM 1 % TD GEL: 4.0000 g | Freq: Four times a day (QID) | TRANSDERMAL | 0 refills | Status: DC

## 2016-09-30 NOTE — Patient Instructions (Addendum)
Thank you for visiting clinic today. We will do x-ray of your left toe, I will call you with any abnormal results. You can use ibuprofen 600 mg every 6 hourly for pain. You can use some icing and Ace bandage to help with the pain. If your symptoms get worse or fail to resolve with the next few days, please contact our clinic.

## 2016-09-30 NOTE — Assessment & Plan Note (Signed)
BP Readings from Last 3 Encounters:  09/30/16 (!) 158/67  09/25/16 (!) 154/66  07/10/16 138/61   Her blood pressure was elevated, she didn't took her medicines this morning.she was anxious and was in pain too.  Continue with the current management and reassess with next follow-up visit.

## 2016-09-30 NOTE — Progress Notes (Addendum)
   CC: Injured left great toe after fall yesterday.  HPI:  Alyssa Haynes is a 71 y.o. lady with past medical history as listed below came to the clinic with complaint of injuring her left great toe after having a fall last night She fell around 9 PM while running after her dog, according to patient she tripped and fell, twisted her right knee and left toe. She was complaining of pain in her right knee, able to bear weight, having mild swelling, no open laceration. She was also complaining of pain, swelling and bruising of left great toe, stating that she is unable to move her toe as it cause excruciating pain. She cannot be her weight on her toes. She denies any head trauma. She denies any dizziness or recurrent falls.  Addendum.She was found to have a fracture of proximal first phalanx with a small displaced fragment. She is a establish patient with Dr. Noah Delaine at [facility]. She will call them and make an appointment to be seen as soon as possible. I advised her to immobilize her left great toe.  Past Medical History:  Diagnosis Date  . Asthma   . Diabetes mellitus   . GERD (gastroesophageal reflux disease)   . Hyperlipidemia   . Hypertension    Review of Systems:  As per HPI.  Physical Exam:  Vitals:   09/30/16 1507  BP: (!) 158/67  Pulse: 61  Temp: 97.8 F (36.6 C)  TempSrc: Oral  SpO2: 100%  Weight: 205 lb 12.8 oz (93.4 kg)  Height: 5\' 7"  (1.702 m)    General: Vital signs reviewed.  Patient is well-developed and well-nourished, in no acute distress and cooperative with exam.  Cardiovascular: RRR, S1 normal, S2 normal, no murmurs, gallops, or rubs. Pulmonary/Chest: Clear to auscultation bilaterally, no wheezes, rales, or rhonchi. Abdominal: Soft, non-tender, non-distended, BS +, no masses, organomegaly, or guarding present.  Musculoskeletal:mild edema of right knee just below patella, mildly restricted range of motion at right  knee, pronounce edema and ecchymoses of left great toe, severely restricted range of motion because of pain. Extremities: No lower extremity edema bilaterally,  pulses symmetric and intact bilaterally. No cyanosis or clubbing. Psychiatric: Normal mood and affect. speech and behavior is normal. Cognition and memory are normal.  Assessment & Plan:   See Encounters Tab for problem based charting.  Patient discussed with Dr. Angelia Mould.

## 2016-09-30 NOTE — Assessment & Plan Note (Signed)
She was having mild tenderness and mildly restricted range of motion at right knee but pronounced swelling, tenderness and severely restricted range of motion at left great toe after fall.  -X-ray of left great toe to rule out any fracture. -Ibuprofen 600 mg every 6 hourly when necessary. -She was advised to do icing, can use over-the-counter rubbing stuff and Ace bandage to keep it immobilized. -We will call her with abnormal x-ray.

## 2016-10-01 NOTE — Addendum Note (Signed)
Addended by: Marcelino Duster on: 10/01/2016 04:50 PM   Modules accepted: Orders

## 2016-10-02 NOTE — Progress Notes (Signed)
Internal Medicine Clinic Attending  Case discussed with Dr. Amin at the time of the visit.  We reviewed the resident's history and exam and pertinent patient test results.  I agree with the assessment, diagnosis, and plan of care documented in the resident's note.    

## 2016-10-04 ENCOUNTER — Encounter: Payer: Self-pay | Admitting: Internal Medicine

## 2016-10-04 ENCOUNTER — Ambulatory Visit: Payer: Medicare Other | Admitting: Family Medicine

## 2016-10-07 ENCOUNTER — Emergency Department (HOSPITAL_COMMUNITY)
Admission: EM | Admit: 2016-10-07 | Discharge: 2016-10-07 | Disposition: A | Discharge status: Home / Self Care | Payer: Medicare Other | Attending: Emergency Medicine | Admitting: Emergency Medicine

## 2016-10-07 ENCOUNTER — Encounter (HOSPITAL_COMMUNITY): Payer: Self-pay | Admitting: Emergency Medicine

## 2016-10-07 ENCOUNTER — Emergency Department (HOSPITAL_COMMUNITY): Payer: Medicare Other

## 2016-10-07 DIAGNOSIS — J45909 Unspecified asthma, uncomplicated: Secondary | ICD-10-CM | POA: Insufficient documentation

## 2016-10-07 DIAGNOSIS — I10 Essential (primary) hypertension: Secondary | ICD-10-CM | POA: Diagnosis not present

## 2016-10-07 DIAGNOSIS — E119 Type 2 diabetes mellitus without complications: Secondary | ICD-10-CM | POA: Insufficient documentation

## 2016-10-07 DIAGNOSIS — T148XXA Other injury of unspecified body region, initial encounter: Secondary | ICD-10-CM | POA: Diagnosis not present

## 2016-10-07 DIAGNOSIS — Z87891 Personal history of nicotine dependence: Secondary | ICD-10-CM | POA: Insufficient documentation

## 2016-10-07 DIAGNOSIS — S79911A Unspecified injury of right hip, initial encounter: Secondary | ICD-10-CM | POA: Diagnosis not present

## 2016-10-07 DIAGNOSIS — Z79899 Other long term (current) drug therapy: Secondary | ICD-10-CM | POA: Diagnosis not present

## 2016-10-07 DIAGNOSIS — M25551 Pain in right hip: Secondary | ICD-10-CM | POA: Insufficient documentation

## 2016-10-07 MED ORDER — OXYCODONE-ACETAMINOPHEN 5-325 MG PO TABS
1.0000 | ORAL_TABLET | Freq: Once | ORAL | Status: AC
  Administered 2016-10-07: 1 via ORAL
  Filled 2016-10-07: qty 1

## 2016-10-07 MED ORDER — CYCLOBENZAPRINE HCL 10 MG PO TABS
10.0000 mg | ORAL_TABLET | Freq: Once | ORAL | Status: AC
  Administered 2016-10-07: 10 mg via ORAL
  Filled 2016-10-07: qty 1

## 2016-10-07 MED ORDER — OXYCODONE-ACETAMINOPHEN 5-325 MG PO TABS: 1.0000 | ORAL_TABLET | ORAL | 0 refills | Status: DC | PRN

## 2016-10-07 MED ORDER — CYCLOBENZAPRINE HCL 10 MG PO TABS: 10.0000 mg | ORAL_TABLET | Freq: Two times a day (BID) | ORAL | 0 refills | Status: DC | PRN

## 2016-10-07 MED FILL — CYCLOBENZAPRINE 10 MG TABLE: 10 | 10 days supply | Qty: 20 | Fill #0

## 2016-10-07 MED FILL — OXYCODONE-ACETAMINOPHEN 5-3: 5-325 | 3 days supply | Qty: 15 | Fill #0

## 2016-10-07 NOTE — ED Notes (Signed)
Bed: WTR7 Expected date:  Expected time:  Means of arrival:  Comments: 

## 2016-10-07 NOTE — Care Management Note (Signed)
Case Management Note  Patient Details  Name: Alyssa Haynes MRN: [id number] Date of Birth: Dec 05, 1945  Subjective/Objective:  71 year old female with hip pain.      Action/Plan: CM consulted for rolling walker.  Notified [facility] who stated they would bring the walker to the pt in the lobby before she left the ED today.  No further CM needs noted at this time.  Expected Discharge Date:    10/07/2016              Expected Discharge Plan:  Home/Self Care  Discharge planning Services  CM Consult  Post Acute Care Choice:  Durable Medical Equipment Choice offered to:  Patient  DME Arranged:  Gilford Rile rolling DME Agency:  [facility]        Start     Ordered   10/07/16 1102  For home use only DME Walker rolling  Once    Question:  Patient needs a walker to treat with the following condition  Answer:  Hip pain   10/07/16 1101       Corky Crafts, RN 10/07/2016, 10:58 AM

## 2016-10-07 NOTE — Discharge Instructions (Signed)
Please use the pain medicine and muscle relaxant as needed to help with your discomfort. Please try and gently stretch or hip flexor muscles. Please follow-up with your primary care physician for further management of your foot fractures and hip pain. Please use the crutches but be careful not to fall. If any symptoms change or worsen, please return to the nearest emergency department.

## 2016-10-07 NOTE — ED Provider Notes (Signed)
[facility] DEPT Provider Note   CSN: [id number] Arrival date & time: 10/07/16  0757     History   Chief Complaint Chief Complaint  Patient presents with  . Hip Pain    HPI Alyssa Haynes is a 71 y.o. female.        The history is provided by the patient and medical records.  Hip Pain  This is a new problem. The current episode started more than 2 days ago. The problem occurs constantly. The problem has been gradually worsening. Pertinent negatives include no chest pain, no abdominal pain, no headaches and no shortness of breath. The symptoms are aggravated by standing and walking. The symptoms are relieved by lying down (releived by laying on L side). She has tried nothing for the symptoms. The treatment provided no relief.    Past Medical History:  Diagnosis Date  . Asthma   . Diabetes mellitus   . GERD (gastroesophageal reflux disease)   . Hyperlipidemia   . Hypertension     Patient Active Problem List   Diagnosis Date Noted  . Toe injury, left, initial encounter 09/30/2016  . Mixed incontinence urge and stress 09/25/2016  . Ovarian cyst 07/10/2016  . Itching 05/15/2016  . Pain in joint, ankle and foot 05/05/2016  . Flat feet, bilateral 04/25/2016  . Healthcare maintenance 04/01/2016  . Chronic venous insufficiency 03/04/2014  . Occipital neuralgia 08/06/2012  . Intrinsic asthma 09/02/2008  . T2DM (type 2 diabetes mellitus) ([place]) 03/12/2006  . Hyperlipidemia associated with type 2 diabetes mellitus ([place]) 03/12/2006  . Essential hypertension 03/12/2006    Past Surgical History:  Procedure Laterality Date  . CESAREAN SECTION  1988  . CHOLECYSTECTOMY  2006  . FRACTURE SURGERY  1992   lEFT ANKLE orif  . TUBAL LIGATION  1988    OB History    No data available       Home Medications    Prior to Admission medications   Medication Sig Start Date End Date Taking? Authorizing Provider  atorvastatin (LIPITOR) 20 MG tablet Take 1 tablet (20 mg total)  by mouth daily. 05/15/16 05/15/17  Sid Falcon, MD  cyanocobalamin 500 MCG tablet Take 500 mcg by mouth daily.    [provider]  Elderberry 575 MG/5ML SYRP Take 1 drop by mouth daily.    [provider]  Ginkgo Biloba (GNP GINGKO BILOBA EXTRACT PO) Take 1 tablet by mouth daily.    [provider]  ibuprofen (ADVIL,MOTRIN) 600 MG tablet Take 1 tablet (600 mg total) by mouth every 6 (six) hours as needed. 09/30/16   Lorella Nimrod, MD  losartan (COZAAR) 50 MG tablet Take 50 mg by mouth daily.  04/11/16   [provider]  pioglitazone-metformin (ACTOPLUS MET) 15-850 MG tablet Take 1 tablet by mouth 2 (two) times daily with a meal. 04/01/16   Florinda Marker, MD    Family History Family History  Problem Relation Age of Onset  . Cancer Mother 87       lUNG CANCER  . Heart disease Father 86  . Heart attack Father     Social History Social History  Substance Use Topics  . Smoking status: Former Smoker    Quit date: 02/19/1995  . Smokeless tobacco: Never Used     Comment: stopped [id number]  . Alcohol use No     Allergies   Other   Review of Systems Review of Systems  Constitutional: Negative for chills, diaphoresis, fatigue and fever.  HENT:  Negative for congestion and rhinorrhea.   Respiratory: Negative for cough, chest tightness, shortness of breath, wheezing and stridor.   Cardiovascular: Negative for chest pain.  Gastrointestinal: Negative for abdominal pain, constipation, diarrhea, nausea and vomiting.  Genitourinary: Negative for flank pain and frequency.  Musculoskeletal: Negative for back pain, neck pain and neck stiffness.  Skin: Negative for rash and wound.  Neurological: Negative for light-headedness, numbness and headaches.  Psychiatric/Behavioral: Negative for agitation and confusion.  All other systems reviewed and are negative.    Physical Exam Updated Vital Signs BP (!) 156/73 (BP Location: Right Arm)   Pulse 82   Temp 97.8 F  (36.6 C) (Oral)   Resp 16   SpO2 100%   Physical Exam  Constitutional: She is oriented to person, place, and time. She appears well-developed and well-nourished. No distress.  HENT:  Head: Normocephalic.  Mouth/Throat: Oropharynx is clear and moist. No oropharyngeal exudate.  Eyes: Pupils are equal, round, and reactive to light. Conjunctivae and EOM are normal.  Neck: Normal range of motion.  Cardiovascular: Normal rate and intact distal pulses.   No murmur heard. Pulmonary/Chest: Effort normal and breath sounds normal. No stridor. No respiratory distress. She has no wheezes. She exhibits no tenderness.  Abdominal: Soft. Bowel sounds are normal. There is no tenderness.  Musculoskeletal: She exhibits tenderness. She exhibits no deformity.       Right hip: She exhibits tenderness. She exhibits normal range of motion, normal strength, no bony tenderness, no crepitus, no deformity and no laceration.       Legs: Pain in the right anterior inguinal area. Pain with hip extension. No pain with hip flexion. Tenderness in the anterior thigh. No numbness, tingling, or weakness. Normal knee movement. Normal pulses. No tenderness of the posterior spine, SI area, or hip. No abdominal tenderness.  Neurological: She is alert and oriented to person, place, and time. No cranial nerve deficit or sensory deficit. She exhibits normal muscle tone. Coordination normal.  Skin: Capillary refill takes less than 2 seconds. No rash noted. She is not diaphoretic. No erythema.  Psychiatric: She has a normal mood and affect.  Nursing note and vitals reviewed.    ED Treatments / Results  Labs (all labs ordered are listed, but only abnormal results are displayed) Labs Reviewed - No data to display  EKG  EKG Interpretation None       Radiology Dg Hip Unilat  With Pelvis 2-3 Views Right  Result Date: 10/07/2016 CLINICAL DATA:  Fall. EXAM: DG HIP (WITH OR WITHOUT PELVIS) 2-3V RIGHT COMPARISON:  CT 06/06/2016.  FINDINGS: No acute bony abnormality identified. Partial fusion L5-S1. Mild degenerative changes lumbar spine and both hips. IMPRESSION: No acute abnormality.  No evidence of fracture or dislocation. Electronically Signed   By: Marcello Moores  Register   On: 10/07/2016 08:49    Procedures Procedures (including critical care time)  Medications Ordered in ED Medications  oxyCODONE-acetaminophen (PERCOCET/ROXICET) 5-325 MG per tablet 1 tablet (1 tablet Oral Given 10/07/16 0939)  cyclobenzaprine (FLEXERIL) tablet 10 mg (10 mg Oral Given 10/07/16 0939)     Initial Impression / Assessment and Plan / ED Course  I have reviewed the triage vital signs and the nursing notes.  Pertinent labs & imaging results that were available during my care of the patient were reviewed by me and considered in my medical decision making (see chart for details).     Alyssa Haynes is a 71 y.o. female with a past medical history significant for hypertension,  hyperlipidemia, diabetes, asthma, and recent left foot injury who presents with right hip pain. Patient reports that 2 weeks ago, she tripped breaking her left great toe. She is scheduled to follow-up with her PCP and orthopedics for this in the next week. She says that 4 days ago, while trying to clean the toilet, she was bending over and felt a sudden onset pain/pop in her right anterior hip area. She reports immediate onset of pain and has been unable to ambulate well due to the discomfort. She reports that she is laying in bed for the last 2 days with the pain. She describes it as an 8 out of 10 and resting but "an 85 out of 10" when she tries to walk. She said she has never had any surgeries on her right leg in the past. Patient denies any leg swelling or history of DVT/PE. No respiratory or chest complaints.   On exam, patient is tenderness in the right inguinal area. Patient has severe pain with hip extension but not flexion. Negative straight leg raise test. Patient  has no tenderness in her low back either midline or paraspinal. No hip tenderness on the back or the side of her hip. Patient has normal sensation, strength, and pulses in her bilateral lower extremities. Normal knee and ankle range of motion. Patient's tenderness is primarily in the proximal small upper anterior quadrant.  Due to location discomfort, suspect a musculoskeletal injury. Patient had x-ray of the right hip in triage show no evidence of fracture or dislocation. Suspect muscular discomfort. Patient given pain medicine and muscle relaxant and reevaluated.  10:21 AM Patient's x-ray results are seen above. No evidence of fracture or dislocation.  Given patient's lack of tenderness in the low back or lateral hip, do not feel patient needs CT imaging. Suspect a muscular or tendon injury in the anterior flexor muscles. Patient felt better after pain medicine and muscle relaxants.  Patient will be given postop shoe for her new left toe fractures from last week. She will also be given a prescription for a walker. Patient we have pursued for pain and muscle relaxant medications. Patient advised to be careful not to fall. Patient will follow-up for her hip and foot pain with her PCP and possibly orthopedics. Patient understands return precautions and follow-up instructions.   Patient had no questions or concerns and was discharged in good condition with improving symptoms.         Final Clinical Impressions(s) / ED Diagnoses   Final diagnoses:  Right hip pain    New Prescriptions New Prescriptions   CYCLOBENZAPRINE (FLEXERIL) 10 MG TABLET    Take 1 tablet (10 mg total) by mouth 2 (two) times daily as needed for muscle spasms.   OXYCODONE-ACETAMINOPHEN (PERCOCET/ROXICET) 5-325 MG TABLET    Take 1 tablet by mouth every 4 (four) hours as needed for severe pain.   Clinical Impression: 1. Right hip pain     Disposition: Discharge  Condition: Good  I have discussed the results, Dx and Tx  plan with the pt(& family if present). He/she/they expressed understanding and agree(s) with the plan. Discharge instructions discussed at great length. Strict return precautions discussed and pt &/or family have verbalized understanding of the instructions. No further questions at time of discharge.    New Prescriptions   CYCLOBENZAPRINE (FLEXERIL) 10 MG TABLET    Take 1 tablet (10 mg total) by mouth 2 (two) times daily as needed for muscle spasms.   OXYCODONE-ACETAMINOPHEN (PERCOCET/ROXICET) 5-325 MG TABLET  Take 1 tablet by mouth every 4 (four) hours as needed for severe pain.    Follow Up: Sid Falcon, MD [place] [id number] [phone]  Schedule an appointment as soon as possible for a visit    [facility] DEPT [place] [id number] mc [address] [place]         Sharmayne Jablon, [name] Allegra, MD 10/07/16 2039

## 2016-10-07 NOTE — ED Triage Notes (Signed)
Per EMS-states she fell 2 days ago-injuring right hip-states she has been laying in bed-states she fell 2 weeks ago breaking left large toe and a bone in left foot-states she has not been treated for most recent fall

## 2016-10-07 NOTE — ED Notes (Signed)
Pt taken in a wheelchair to the waiting room to wait for [place] to delivery her a walker.  Pt expressed understanding.

## 2016-10-08 DIAGNOSIS — M25551 Pain in right hip: Secondary | ICD-10-CM | POA: Diagnosis not present

## 2016-10-08 DIAGNOSIS — M545 Low back pain: Secondary | ICD-10-CM | POA: Diagnosis not present

## 2016-10-18 ENCOUNTER — Ambulatory Visit: Payer: Medicare Other | Admitting: Family Medicine

## 2016-10-18 ENCOUNTER — Telehealth: Payer: Self-pay | Admitting: *Deleted

## 2016-10-18 NOTE — Telephone Encounter (Signed)
Request for PA for Diclofenac Gel from [employer].  Call to patient's pharmacy [facility].  Patiemt is no longer on the medication was discontiued on 09/30/2016.  Sander Nephew, RN 10/17/2016 11:27 AM

## 2016-10-22 DIAGNOSIS — M7061 Trochanteric bursitis, right hip: Secondary | ICD-10-CM | POA: Diagnosis not present

## 2016-10-22 DIAGNOSIS — S92411A Displaced fracture of proximal phalanx of right great toe, initial encounter for closed fracture: Secondary | ICD-10-CM | POA: Diagnosis not present

## 2016-11-12 ENCOUNTER — Other Ambulatory Visit: Payer: Self-pay | Admitting: Internal Medicine

## 2016-11-12 DIAGNOSIS — E785 Hyperlipidemia, unspecified: Principal | ICD-10-CM

## 2016-11-12 DIAGNOSIS — E1169 Type 2 diabetes mellitus with other specified complication: Secondary | ICD-10-CM

## 2016-11-19 NOTE — Addendum Note (Signed)
Addended by: Hulan Fray on: 11/19/2016 07:45 PM   Modules accepted: Orders

## 2016-11-29 ENCOUNTER — Ambulatory Visit (INDEPENDENT_AMBULATORY_CARE_PROVIDER_SITE_OTHER): Payer: Medicare Other | Admitting: *Deleted

## 2016-11-29 DIAGNOSIS — Z23 Encounter for immunization: Secondary | ICD-10-CM

## 2016-12-09 ENCOUNTER — Encounter: Payer: Self-pay | Admitting: Internal Medicine

## 2016-12-10 DIAGNOSIS — M79675 Pain in left toe(s): Secondary | ICD-10-CM | POA: Diagnosis not present

## 2016-12-10 DIAGNOSIS — M722 Plantar fascial fibromatosis: Secondary | ICD-10-CM | POA: Diagnosis not present

## 2016-12-25 ENCOUNTER — Encounter: Payer: Self-pay | Admitting: Internal Medicine

## 2016-12-25 ENCOUNTER — Other Ambulatory Visit: Payer: Self-pay | Admitting: Internal Medicine

## 2016-12-25 ENCOUNTER — Ambulatory Visit (INDEPENDENT_AMBULATORY_CARE_PROVIDER_SITE_OTHER): Payer: Medicare Other | Admitting: Internal Medicine

## 2016-12-25 ENCOUNTER — Other Ambulatory Visit: Payer: Self-pay

## 2016-12-25 VITALS — BP 150/59 | HR 78 | Temp 98.2°F | Ht 67.0 in | Wt 207.9 lb

## 2016-12-25 DIAGNOSIS — Z818 Family history of other mental and behavioral disorders: Secondary | ICD-10-CM

## 2016-12-25 DIAGNOSIS — R350 Frequency of micturition: Secondary | ICD-10-CM | POA: Diagnosis not present

## 2016-12-25 DIAGNOSIS — E119 Type 2 diabetes mellitus without complications: Secondary | ICD-10-CM

## 2016-12-25 DIAGNOSIS — N3946 Mixed incontinence: Secondary | ICD-10-CM

## 2016-12-25 DIAGNOSIS — E1169 Type 2 diabetes mellitus with other specified complication: Secondary | ICD-10-CM | POA: Diagnosis not present

## 2016-12-25 DIAGNOSIS — Z8781 Personal history of (healed) traumatic fracture: Secondary | ICD-10-CM | POA: Diagnosis not present

## 2016-12-25 DIAGNOSIS — Z7984 Long term (current) use of oral hypoglycemic drugs: Secondary | ICD-10-CM | POA: Diagnosis not present

## 2016-12-25 DIAGNOSIS — E785 Hyperlipidemia, unspecified: Secondary | ICD-10-CM

## 2016-12-25 DIAGNOSIS — Z794 Long term (current) use of insulin: Principal | ICD-10-CM

## 2016-12-25 DIAGNOSIS — Z780 Asymptomatic menopausal state: Secondary | ICD-10-CM | POA: Diagnosis not present

## 2016-12-25 DIAGNOSIS — E2839 Other primary ovarian failure: Secondary | ICD-10-CM

## 2016-12-25 DIAGNOSIS — Z79899 Other long term (current) drug therapy: Secondary | ICD-10-CM | POA: Diagnosis not present

## 2016-12-25 DIAGNOSIS — Z0000 Encounter for general adult medical examination without abnormal findings: Secondary | ICD-10-CM

## 2016-12-25 DIAGNOSIS — L720 Epidermal cyst: Secondary | ICD-10-CM | POA: Diagnosis not present

## 2016-12-25 DIAGNOSIS — I10 Essential (primary) hypertension: Secondary | ICD-10-CM

## 2016-12-25 DIAGNOSIS — M5481 Occipital neuralgia: Secondary | ICD-10-CM | POA: Diagnosis not present

## 2016-12-25 DIAGNOSIS — Z87891 Personal history of nicotine dependence: Secondary | ICD-10-CM | POA: Diagnosis not present

## 2016-12-25 LAB — GLUCOSE, CAPILLARY: Glucose-Capillary: 129 mg/dL — ABNORMAL HIGH (ref 65–99)

## 2016-12-25 LAB — POCT GLYCOSYLATED HEMOGLOBIN (HGB A1C): Hemoglobin A1C: 7.6

## 2016-12-25 NOTE — Assessment & Plan Note (Signed)
Small and well circumscribed.  Non irritated today. I asked her to keep an eye out for concerning changes and I will evaluate at next visit.

## 2016-12-25 NOTE — Assessment & Plan Note (Signed)
BP today is 150/59.  She does not take her medication before coming in to clinic.  She has no headaches, blurry vision or chest pain.  She takes the medication later in the day.  We discussed a blood pressure log and she is willing to do this prior to her next visit.   Plan Continue losartan BP log for home measurements

## 2016-12-25 NOTE — Patient Instructions (Signed)
Alyssa Haynes - -   It was a pleasure to see you today!  For your blood pressure, please try to keep a blood pressure log over the next few weeks and bring it in when I see you next.  I want to make sure your losartan is working.   For your diabetes, please try 3 more months of diet and exercise.  If this is not working, we will need to add another medication.    Please schedule a DEXA scan to check your bone density. More information below.   Thank you!  Bone Densitometry Bone densitometry is an imaging test that uses a special X-ray to measure the amount of calcium and other minerals in your bones (bone density). This test is also known as a bone mineral density test or dual-energy X-ray absorptiometry (DEXA). The test can measure bone density at your hip and your spine. It is similar to having a regular X-ray. You may have this test to:  Diagnose a condition that causes weak or thin bones (osteoporosis).  Predict your risk of a broken bone (fracture).  Determine how well osteoporosis treatment is working.  Tell a health care provider about:  Any allergies you have.  All medicines you are taking, including vitamins, herbs, eye drops, creams, and over-the-counter medicines.  Any problems you or family members have had with anesthetic medicines.  Any blood disorders you have.  Any surgeries you have had.  Any medical conditions you have.  Possibility of pregnancy.  Any other medical test you had within the previous 14 days that used contrast material. What are the risks? Generally, this is a safe procedure. However, problems can occur and may include the following:  This test exposes you to a very small amount of radiation.  The risks of radiation exposure may be greater to unborn children.  What happens before the procedure?  Do not take any calcium supplements for 24 hours before having the test. You can otherwise eat and drink what you usually do.  Take off all  metal jewelry, eyeglasses, dental appliances, and any other metal objects. What happens during the procedure?  You may lie on an exam table. There will be an X-ray generator below you and an imaging device above you.  Other devices, such as boxes or braces, may be used to position your body properly for the scan.  You will need to lie still while the machine slowly scans your body.  The images will show up on a computer monitor. What happens after the procedure? You may need more testing at a later time. This information is not intended to replace advice given to you by your health care provider. Make sure you discuss any questions you have with your health care provider. Document Released: 02/27/2004 Document Revised: 07/13/2015 Document Reviewed: 07/15/2013 Elsevier Interactive Patient Education  2018 [employer].

## 2016-12-25 NOTE — Assessment & Plan Note (Signed)
Last LDL checked was 66.  She is on atorvastatin and having no side effects.   Plan Continue atorvastatin.

## 2016-12-25 NOTE — Assessment & Plan Note (Signed)
Seems well controlled at this time with PRN excedrin. S he states when she is stressed they start to come on and she knows what to do to abort an attack.   Continue symptomatic therapy PRN.

## 2016-12-25 NOTE — Assessment & Plan Note (Signed)
She has been trying the Kegel exercises on her own and having some success.  She is not interested in PT or medications at this time.   Plan Continue timed toileting and Kegel exercises.

## 2016-12-25 NOTE — Assessment & Plan Note (Signed)
DEXA scan ordered today 

## 2016-12-25 NOTE — Progress Notes (Signed)
   Subjective:    Patient ID: Alyssa Haynes, female    DOB: [birth year], 71 y.o.   MRN: [id number]  CC: 3 month follow up for DM2  HPI  Ms. Khachatryan is a 71yo woman with PMH of DM2, HTN, HLD who presents today for follow up.   She was last seen in clinic for a broken toe.  Today she reports that she is healed and feels great.    As concerns her DM, her A1C today was 7.6 which is the same as last visit.  She is taking pioglitozone-metformin currently.  She reports that she did not follow a diabetic diet and increased her carb intake over the last few months.  She would like another trial of diet and exercise.  She has had a toe fracture and bone spur which are now healed and she feels that she can exercise successfully again.  She would like to avoid more medications.    Her BP today was elevated at 150/59, she has not taken her losartan.  She is amenable to a blood pressure log to see what her BP is at home.    She told me that she is under a lot of stress at home.  She has 5 kids, one with bipolar and 69 grandkids.  Something is always going on.  She and her husband are also just now getting out from under a bad bankruptcy.  She has a very good attitude through this, but feels her BP and incontinence are influenced by her stress.    She has a 69mmX10mm inclusion cyst on her chest which she would like looked at.    She follows with Dr. Herbert Deaner for ophthalmology.    Review of Systems  Constitutional: Negative for activity change and appetite change.  Respiratory: Negative for cough and shortness of breath.   Cardiovascular: Negative for chest pain and leg swelling.  Genitourinary: Positive for frequency and urgency. Negative for difficulty urinating, hematuria and pelvic pain.  Skin:       Small cyst on chest  Psychiatric/Behavioral: Negative for decreased concentration and dysphoric mood.       Objective:   Physical Exam  Constitutional: She is oriented to person, place, and time.  She appears well-developed and well-nourished. No distress.  HENT:  Head: Normocephalic and atraumatic.  Eyes: Conjunctivae are normal. No scleral icterus.  Cardiovascular: Normal rate, regular rhythm and normal heart sounds.  No murmur heard. Pulmonary/Chest: Effort normal and breath sounds normal. No respiratory distress. She has no wheezes.  Neurological: She is alert and oriented to person, place, and time.  Skin: Skin is warm and dry.  She has a 69mmX10mm shallow inclusion cyst on her left chest right above the left breast.  Non tender, no skin color change.  No drainage  Psychiatric: She has a normal mood and affect. Her behavior is normal.  Vitals reviewed.   HCV screen today.        Assessment & Plan:  RTC in 3 months, sooner if needed.

## 2016-12-25 NOTE — Assessment & Plan Note (Signed)
A1C remains 7.6.  She would like to attempt diet and lifestyle.  She is able to teach back appropriate diet, she just has a hard time complying.  Her LDL is 66, Her BP is elevated, see HTN problem.  She sees Dr. Herbert Deaner for ophthalmology and her renal function is stable on an ARB.   Plan Continue pioglitazone-metformin Trial of diet and lifestyle, we discussed needing to add another medication at next visit if A1C not improving.

## 2016-12-26 ENCOUNTER — Encounter: Payer: Self-pay | Admitting: Internal Medicine

## 2016-12-26 LAB — HEPATITIS C ANTIBODY: Hep C Virus Ab: <0.1 s/co ratio (ref 0.0–0.9)

## 2017-01-02 ENCOUNTER — Telehealth: Payer: Self-pay | Admitting: *Deleted

## 2017-01-02 NOTE — Telephone Encounter (Signed)
Pt called and expressed concern over losartan, advised to call her pharmacy and speak w/ the pharmacist about her losartan, she is agreeable

## 2017-01-07 ENCOUNTER — Encounter (INDEPENDENT_AMBULATORY_CARE_PROVIDER_SITE_OTHER): Payer: Self-pay

## 2017-01-20 ENCOUNTER — Ambulatory Visit
Admission: RE | Admit: 2017-01-20 | Discharge: 2017-01-20 | Disposition: A | Discharge status: Home / Self Care | Payer: Medicare Other | Source: Ambulatory Visit | Attending: Internal Medicine | Admitting: Internal Medicine

## 2017-01-20 DIAGNOSIS — M8589 Other specified disorders of bone density and structure, multiple sites: Secondary | ICD-10-CM | POA: Diagnosis not present

## 2017-01-20 DIAGNOSIS — Z780 Asymptomatic menopausal state: Secondary | ICD-10-CM | POA: Diagnosis not present

## 2017-01-20 DIAGNOSIS — E2839 Other primary ovarian failure: Secondary | ICD-10-CM

## 2017-01-22 ENCOUNTER — Telehealth: Payer: Self-pay

## 2017-01-22 NOTE — Telephone Encounter (Signed)
Requesting a bone density test results. Please call back.

## 2017-01-24 NOTE — Telephone Encounter (Signed)
Called patient to discuss DEXA results of osteopenia. No recommendation for therapy at this time.  FRAX score wsa 2.2% for hip fracture and 12% for major osteoporotic fracture.   Answered patient's questions.

## 2017-03-26 ENCOUNTER — Other Ambulatory Visit: Payer: Self-pay

## 2017-03-26 ENCOUNTER — Ambulatory Visit (INDEPENDENT_AMBULATORY_CARE_PROVIDER_SITE_OTHER): Payer: Medicare Other | Admitting: Internal Medicine

## 2017-03-26 ENCOUNTER — Encounter: Payer: Self-pay | Admitting: Internal Medicine

## 2017-03-26 VITALS — BP 149/60 | HR 72 | Temp 98.4°F | Ht 67.0 in | Wt 211.5 lb

## 2017-03-26 DIAGNOSIS — M25561 Pain in right knee: Secondary | ICD-10-CM

## 2017-03-26 DIAGNOSIS — M25562 Pain in left knee: Secondary | ICD-10-CM

## 2017-03-26 DIAGNOSIS — R5383 Other fatigue: Secondary | ICD-10-CM

## 2017-03-26 DIAGNOSIS — Z794 Long term (current) use of insulin: Secondary | ICD-10-CM | POA: Diagnosis not present

## 2017-03-26 DIAGNOSIS — Z7984 Long term (current) use of oral hypoglycemic drugs: Secondary | ICD-10-CM | POA: Diagnosis not present

## 2017-03-26 DIAGNOSIS — Z79899 Other long term (current) drug therapy: Secondary | ICD-10-CM

## 2017-03-26 DIAGNOSIS — E119 Type 2 diabetes mellitus without complications: Secondary | ICD-10-CM | POA: Diagnosis not present

## 2017-03-26 DIAGNOSIS — I10 Essential (primary) hypertension: Secondary | ICD-10-CM | POA: Diagnosis not present

## 2017-03-26 DIAGNOSIS — E1169 Type 2 diabetes mellitus with other specified complication: Secondary | ICD-10-CM | POA: Diagnosis not present

## 2017-03-26 DIAGNOSIS — E785 Hyperlipidemia, unspecified: Secondary | ICD-10-CM | POA: Diagnosis not present

## 2017-03-26 DIAGNOSIS — M8580 Other specified disorders of bone density and structure, unspecified site: Secondary | ICD-10-CM

## 2017-03-26 DIAGNOSIS — Z87891 Personal history of nicotine dependence: Secondary | ICD-10-CM

## 2017-03-26 DIAGNOSIS — Z0000 Encounter for general adult medical examination without abnormal findings: Secondary | ICD-10-CM

## 2017-03-26 LAB — POCT GLYCOSYLATED HEMOGLOBIN (HGB A1C): Hemoglobin A1C: 7

## 2017-03-26 LAB — GLUCOSE, CAPILLARY: Glucose-Capillary: 123 mg/dL — ABNORMAL HIGH (ref 65–99)

## 2017-03-26 NOTE — Assessment & Plan Note (Signed)
DEXA: Osteopenia - discussed adequate calcium and vitamin D in diet.  She is exercising.  Colon: Fit testing today PAP: Never had an abnormal, > 65 Mammogram: 2017 negative.

## 2017-03-26 NOTE — Assessment & Plan Note (Signed)
BP today was 149/60.  She reports that she is nervous coming in to clinic.  She does not take her BP at home.  I reminded her that we will need to go up on her medications and I need to see her BP results.  She reports that she will start taking it.   Plan Continue losartan BP log If she cannot get the log done, we will need to add something like amlodipine or hctz

## 2017-03-26 NOTE — Assessment & Plan Note (Signed)
Last LDL was 66.  She is on atorvastatin.   Plan Continue atorvastatin

## 2017-03-26 NOTE — Assessment & Plan Note (Signed)
A1C is improved to 7.0 which I congratulated her on.  Her LDL was 66 at last check.  BP is higher than we would like, but she will check at home.  Possible white coat hypertension.  She is due for an appointment with [facility] which she states that she will schedule.   Plan Continue diet/exercise Continue pioglitazone-metformin Follow up in 4 months.

## 2017-03-26 NOTE — Patient Instructions (Addendum)
Ms. Jiron - -  Thank you for coming in today.   For your blood pressure, please use your husbands blood pressure cuff to check your blood pressure at home and bring in for review.  You may need another medication.   For your diabetes, please keep doing what you are doing with a better diet and exercise.  We may not see the results of this until next time.   Please return FIT card to the clinic with all parts filled out so that we know it is is from you.   I will call you with the results from your blood work today.    Please come back and see me in 4 months.

## 2017-03-26 NOTE — Progress Notes (Signed)
   Subjective:    Patient ID: Alyssa Haynes, female    DOB: 07/10/1945, 72 y.o.   MRN: [id number]  CC: 3 month follow up for DM  HPI   [name] is a 72yo woman with PMH of DM2, HTN, HLD who presents for routine follow up.    [name] reports that she has been eating better over the last month, joined the gym and is exercising a few times a week.  During this change in behavior, she has noticed that she is feeling more tired in her body.  She reports that she is sometimes dragging her feet, having difficulty getting up from a seated position and feels like her weight is pushing her down.  She does not mention any issues with sleep or mood, though she does have quite a bit of stress in her life with her kids and grandkids.  She denies blood loss, h/o anemia or thyroid disorders, numbness.  She does note some mild thinning of her hair, but no dry skin.  She feels like she gets tired doing her hair.    For her DM, she has been trying to eat healthier and is eating more vegetables.  This change has resulted in an A1C of 7.0 which I congratulated her on. She is due for an eye appointment with Dr. Herbert Deaner.    For her BP, it is high today.  Her daughter who is in the room notes that coming in stresses her out given the long walk and finding a parking spot.  She has not been able to do a blood pressure log but reports that her husband has one at home.      Review of Systems  Constitutional: Positive for activity change and fatigue. Negative for chills, fever and unexpected weight change.  Respiratory: Negative for cough and shortness of breath.   Cardiovascular: Negative for chest pain.  Gastrointestinal: Negative for anal bleeding and blood in stool.  Musculoskeletal: Positive for arthralgias (knee). Negative for back pain, gait problem, joint swelling and myalgias.  Skin: Negative for color change and rash.  Neurological: Positive for weakness. Negative for dizziness, speech difficulty,  light-headedness and numbness.  Psychiatric/Behavioral: Negative for decreased concentration and dysphoric mood.       Objective:   Physical Exam  Constitutional: She is oriented to person, place, and time. She appears well-developed and well-nourished. No distress.  HENT:  Head: Normocephalic and atraumatic.  Neck: Neck supple. No thyromegaly present.  Cardiovascular: Normal rate, regular rhythm, normal heart sounds and intact distal pulses.  No murmur heard. Pulmonary/Chest: Effort normal and breath sounds normal. No respiratory distress. She has no wheezes.  Abdominal: Soft. Bowel sounds are normal.  Musculoskeletal: She exhibits no edema or tenderness.  Lymphadenopathy:    She has no cervical adenopathy.  Neurological: She is alert and oriented to person, place, and time. She exhibits normal muscle tone.  She has 5/5 strength in BUE and BLE.    Skin: Skin is warm and dry.  Psychiatric: She has a normal mood and affect. Her behavior is normal.  Vitals reviewed.  BMET, CBC, TSH, CK and ESR today.         Assessment & Plan:  RTC in 4 months.  Sooner if needed.

## 2017-03-26 NOTE — Assessment & Plan Note (Signed)
She reports a long standing issue with this, but seems to have worsened lately.  Main issue is after she is sitting for a long while, it will be hard to stand up and get going.  She is still able to exercise and walk for 1 mile on the treadmill.  She notes that her husband notices her dragging her feet sometimes.  She feels the main issue is in her quads.  She reports always feeling tired when she lifts her hands over her head to do her hair.  She has no swelling in the neck or thyroid issues.  No CP or SOB.   Plan Check BMET, CK, TSH, CBC for anemia and ESR today.  I think this may be psychological, but would like to rule out organic cause.  Will consider assessing for anxiety and/or depression if all blood work normal.

## 2017-03-27 DIAGNOSIS — E119 Type 2 diabetes mellitus without complications: Secondary | ICD-10-CM | POA: Diagnosis not present

## 2017-03-27 DIAGNOSIS — Z794 Long term (current) use of insulin: Secondary | ICD-10-CM | POA: Diagnosis not present

## 2017-03-27 LAB — BMP8+ANION GAP
Anion Gap: 14 mmol/L (ref 10.0–18.0)
BUN/Creatinine Ratio: 14 (ref 12–28)
BUN: 11 mg/dL (ref 8–27)
CO2: 21 mmol/L (ref 20–29)
Calcium: 9.5 mg/dL (ref 8.7–10.3)
Chloride: 106 mmol/L (ref 96–106)
Creatinine, Ser: 0.81 mg/dL (ref 0.57–1.00)
GFR calc Af Amer: 86 mL/min/{1.73_m2} (ref >59)
GFR calc non Af Amer: 74 mL/min/{1.73_m2} (ref >59)
Glucose: 118 mg/dL — ABNORMAL HIGH (ref 65–99)
Potassium: 4.3 mmol/L (ref 3.5–5.2)
Sodium: 141 mmol/L (ref 134–144)

## 2017-03-27 LAB — CBC WITH DIFFERENTIAL/PLATELET
Basophils Absolute: 0 10*3/uL (ref 0.0–0.2)
Basos: 1 %
EOS (ABSOLUTE): 0.2 10*3/uL (ref 0.0–0.4)
Eos: 7 %
Hematocrit: 34.9 % (ref 34.0–46.6)
Hemoglobin: 11.3 g/dL (ref 11.1–15.9)
Immature Grans (Abs): 0 10*3/uL (ref 0.0–0.1)
Immature Granulocytes: 0 %
Lymphocytes Absolute: 1.2 10*3/uL (ref 0.7–3.1)
Lymphs: 39 %
MCH: 29.8 pg (ref 26.6–33.0)
MCHC: 32.4 g/dL (ref 31.5–35.7)
MCV: 92 fL (ref 79–97)
Monocytes Absolute: 0.3 10*3/uL (ref 0.1–0.9)
Monocytes: 8 %
Neutrophils Absolute: 1.4 10*3/uL (ref 1.4–7.0)
Neutrophils: 45 %
Platelets: 221 10*3/uL (ref 150–379)
RBC: 3.79 x10E6/uL (ref 3.77–5.28)
RDW: 14.6 % (ref 12.3–15.4)
WBC: 3.1 10*3/uL — ABNORMAL LOW (ref 3.4–10.8)

## 2017-03-27 LAB — TSH: TSH: 3.71 u[IU]/mL (ref 0.450–4.500)

## 2017-03-27 LAB — CK: Total CK: 115 U/L (ref 24–173)

## 2017-03-27 LAB — SEDIMENTATION RATE: Sed Rate: 53 mm/hr — ABNORMAL HIGH (ref 0–40)

## 2017-04-03 ENCOUNTER — Encounter: Payer: Self-pay | Admitting: Internal Medicine

## 2017-04-03 ENCOUNTER — Other Ambulatory Visit: Payer: Medicare Other

## 2017-04-03 ENCOUNTER — Telehealth: Payer: Self-pay | Admitting: Internal Medicine

## 2017-04-03 DIAGNOSIS — Z794 Long term (current) use of insulin: Principal | ICD-10-CM

## 2017-04-03 DIAGNOSIS — E119 Type 2 diabetes mellitus without complications: Secondary | ICD-10-CM

## 2017-04-03 NOTE — Telephone Encounter (Signed)
Attempted to call Ms. Plog for lab results, VM only without identifying information so no message left.    Patient has a very mildly elevated ESR which I think is not clinically significant, I will check it again at next visit.    Otherwise, labs were okay.   Glenda - - If patient calls back, can you give her this information?   I will also send a letter.   Gilles Chiquito, MD

## 2017-04-03 NOTE — Progress Notes (Signed)
Labs reviewed.  ESR mildly elevated and WBC mildly low.  Recheck ESR at next visit.

## 2017-04-04 ENCOUNTER — Other Ambulatory Visit: Payer: Self-pay | Admitting: Internal Medicine

## 2017-04-04 ENCOUNTER — Encounter: Payer: Self-pay | Admitting: Internal Medicine

## 2017-04-04 DIAGNOSIS — E669 Obesity, unspecified: Secondary | ICD-10-CM | POA: Insufficient documentation

## 2017-04-04 LAB — FECAL OCCULT BLOOD, IMMUNOCHEMICAL: Fecal Occult Bld: NEGATIVE

## 2017-04-08 ENCOUNTER — Encounter: Payer: Self-pay | Admitting: Internal Medicine

## 2017-04-21 ENCOUNTER — Other Ambulatory Visit: Payer: Self-pay

## 2017-04-21 DIAGNOSIS — I10 Essential (primary) hypertension: Secondary | ICD-10-CM

## 2017-04-21 NOTE — Telephone Encounter (Signed)
Question about a recalled on losartan (COZAAR) 50 MG tablet. Would like to know if she can get a different med. Please call pt back.

## 2017-04-21 NOTE — Telephone Encounter (Signed)
Pt's request sent to Dr Daryll Drown.

## 2017-04-22 NOTE — Telephone Encounter (Signed)
I have a note in to Dr. Maudie Mercury.  I will likely change it, but want to find out what should be covered.  Will call her tomorrow with final recommendation.  Thanks!

## 2017-04-22 NOTE — Telephone Encounter (Signed)
Received fax from [place] , Losartan has been recalled ,pt is asking to change med to something else. Thanks

## 2017-04-23 MED ORDER — OLMESARTAN MEDOXOMIL 5 MG PO TABS: 10.0000 mg | ORAL_TABLET | Freq: Every day | ORAL | 6 refills | Status: DC

## 2017-04-23 NOTE — Telephone Encounter (Signed)
Called and spoke with patient.  Confirmed identity with name and DOB.   Will send in comparable dose of olmesartan, 10mg  daily for patient to start taking.  She expressed understanding and I answered all questions.   Gilles Chiquito, MD

## 2017-05-13 ENCOUNTER — Telehealth: Payer: Self-pay | Admitting: Internal Medicine

## 2017-05-13 NOTE — Telephone Encounter (Signed)
Dr Daryll Drown actually did a dose decrease from Losartan to olmesartan. But everyone acts to meds differently. Is she checking her BP at home using her husband's cuff? She may certainly change to olmesartan 5 mg one pill BID. If still dizzy, change to olmesartan 5 mg one pill once a day. I would strongly encourage her to check BP at home or make appt [facility] 1-2 weeks after dose change to get BP checked.

## 2017-05-13 NOTE — Telephone Encounter (Signed)
Patient medication change, two little tiny pills, when she takes both pills together she gets dizzy, headache and doesn't feel like doing any things. She was wonder if she can take one in the morning and one in the evening. Pls contact patient

## 2017-05-13 NOTE — Telephone Encounter (Signed)
Left msg to call back- looks like her losartan was changed to Olmesartan 2 tabs daily on 04/23/17.

## 2017-05-14 ENCOUNTER — Telehealth: Payer: Self-pay | Admitting: Internal Medicine

## 2017-05-14 DIAGNOSIS — M25552 Pain in left hip: Secondary | ICD-10-CM | POA: Diagnosis not present

## 2017-05-14 DIAGNOSIS — M7062 Trochanteric bursitis, left hip: Secondary | ICD-10-CM | POA: Diagnosis not present

## 2017-05-14 DIAGNOSIS — M79672 Pain in left foot: Secondary | ICD-10-CM | POA: Diagnosis not present

## 2017-05-14 DIAGNOSIS — M722 Plantar fascial fibromatosis: Secondary | ICD-10-CM | POA: Diagnosis not present

## 2017-05-14 NOTE — Telephone Encounter (Signed)
Informed pt .

## 2017-05-14 NOTE — Telephone Encounter (Signed)
Sure

## 2017-05-14 NOTE — Telephone Encounter (Signed)
PROBLEM WITH B/P MEDICATION, PLEASE CALL HER BACK

## 2017-05-14 NOTE — Telephone Encounter (Signed)
Pt states pharmacist told her to take the olmasartan on first day 1 in am and 1 pm, then to take both together, she stated the first day she did fine but since taking them together she has become dizzy, she would like to try them 1 twice daily, like at 7 and 7, could she do this?

## 2017-05-19 DIAGNOSIS — M25552 Pain in left hip: Secondary | ICD-10-CM | POA: Diagnosis not present

## 2017-05-19 DIAGNOSIS — M79672 Pain in left foot: Secondary | ICD-10-CM | POA: Diagnosis not present

## 2017-05-19 DIAGNOSIS — M722 Plantar fascial fibromatosis: Secondary | ICD-10-CM | POA: Diagnosis not present

## 2017-05-19 DIAGNOSIS — M7062 Trochanteric bursitis, left hip: Secondary | ICD-10-CM | POA: Diagnosis not present

## 2017-05-20 ENCOUNTER — Other Ambulatory Visit: Payer: Self-pay | Admitting: *Deleted

## 2017-05-20 DIAGNOSIS — Z794 Long term (current) use of insulin: Principal | ICD-10-CM

## 2017-05-20 DIAGNOSIS — E119 Type 2 diabetes mellitus without complications: Secondary | ICD-10-CM

## 2017-05-20 MED ORDER — PIOGLITAZONE HCL-METFORMIN HCL 15-850 MG PO TABS: 1.0000 | ORAL_TABLET | Freq: Two times a day (BID) | ORAL | 3 refills | Status: DC

## 2017-05-20 NOTE — Telephone Encounter (Signed)
Received refill request from [facility] [address])-pt has appt with pcp on 06/05.  Will send refill request to attending pool for consideration as pcp is currently out of the office.  Regenia Skeeter, Darlene Cassady4/2/20199:21 AM

## 2017-05-23 DIAGNOSIS — H2513 Age-related nuclear cataract, bilateral: Secondary | ICD-10-CM | POA: Diagnosis not present

## 2017-05-23 DIAGNOSIS — E113291 Type 2 diabetes mellitus with mild nonproliferative diabetic retinopathy without macular edema, right eye: Secondary | ICD-10-CM | POA: Diagnosis not present

## 2017-05-23 DIAGNOSIS — H35033 Hypertensive retinopathy, bilateral: Secondary | ICD-10-CM | POA: Diagnosis not present

## 2017-05-23 DIAGNOSIS — H40013 Open angle with borderline findings, low risk, bilateral: Secondary | ICD-10-CM | POA: Diagnosis not present

## 2017-05-23 LAB — HM DIABETES EYE EXAM

## 2017-05-26 ENCOUNTER — Encounter: Payer: Self-pay | Admitting: *Deleted

## 2017-05-27 ENCOUNTER — Encounter: Payer: Self-pay | Admitting: *Deleted

## 2017-07-10 ENCOUNTER — Ambulatory Visit: Payer: Medicare Other

## 2017-07-17 ENCOUNTER — Other Ambulatory Visit: Payer: Self-pay | Admitting: Internal Medicine

## 2017-07-17 MED ORDER — ACCU-CHEK AVIVA PLUS W/DEVICE KIT: PACK | 0 refills | Status: AC

## 2017-07-17 MED ORDER — GLUCOSE BLOOD VI STRP: ORAL_STRIP | 1 refills | Status: DC

## 2017-07-17 NOTE — Telephone Encounter (Signed)
NEEDS REFILL ON TEST STRIPS, [facility], [address].

## 2017-07-23 ENCOUNTER — Ambulatory Visit (INDEPENDENT_AMBULATORY_CARE_PROVIDER_SITE_OTHER): Payer: Medicare Other | Admitting: Internal Medicine

## 2017-07-23 ENCOUNTER — Other Ambulatory Visit: Payer: Self-pay | Admitting: Internal Medicine

## 2017-07-23 ENCOUNTER — Encounter: Payer: Self-pay | Admitting: Internal Medicine

## 2017-07-23 ENCOUNTER — Other Ambulatory Visit: Payer: Self-pay

## 2017-07-23 VITALS — BP 129/57 | HR 72 | Temp 98.6°F | Ht 67.0 in | Wt 215.0 lb

## 2017-07-23 DIAGNOSIS — E785 Hyperlipidemia, unspecified: Principal | ICD-10-CM

## 2017-07-23 DIAGNOSIS — E669 Obesity, unspecified: Secondary | ICD-10-CM

## 2017-07-23 DIAGNOSIS — E1169 Type 2 diabetes mellitus with other specified complication: Secondary | ICD-10-CM | POA: Diagnosis not present

## 2017-07-23 DIAGNOSIS — E119 Type 2 diabetes mellitus without complications: Secondary | ICD-10-CM | POA: Diagnosis not present

## 2017-07-23 DIAGNOSIS — I10 Essential (primary) hypertension: Secondary | ICD-10-CM | POA: Diagnosis not present

## 2017-07-23 DIAGNOSIS — Z79899 Other long term (current) drug therapy: Secondary | ICD-10-CM

## 2017-07-23 DIAGNOSIS — M1612 Unilateral primary osteoarthritis, left hip: Secondary | ICD-10-CM | POA: Diagnosis not present

## 2017-07-23 DIAGNOSIS — J45909 Unspecified asthma, uncomplicated: Secondary | ICD-10-CM | POA: Diagnosis not present

## 2017-07-23 DIAGNOSIS — Z794 Long term (current) use of insulin: Secondary | ICD-10-CM | POA: Diagnosis not present

## 2017-07-23 DIAGNOSIS — Z6833 Body mass index (BMI) 33.0-33.9, adult: Secondary | ICD-10-CM

## 2017-07-23 DIAGNOSIS — Z7984 Long term (current) use of oral hypoglycemic drugs: Secondary | ICD-10-CM

## 2017-07-23 LAB — GLUCOSE, CAPILLARY: Glucose-Capillary: 247 mg/dL — ABNORMAL HIGH (ref 65–99)

## 2017-07-23 LAB — POCT GLYCOSYLATED HEMOGLOBIN (HGB A1C): Hemoglobin A1C: 7.7 % — AB (ref 4.0–5.6)

## 2017-07-23 NOTE — Progress Notes (Signed)
   Subjective:    Patient ID: Alyssa Haynes, female    DOB: 1945/05/20, 72 y.o.   MRN: [id number]  4 month follow up for DM and HTN.   HPI  Alyssa Haynes is a 72yo woman with PMH of Fatigue (unexplained), DM2, HLD, HTN, asthma who presents for follow up.   At last visit, she noted fatigue.  Lab work was unrevealing except for a mildly elevated ESR which is likely not clinically significant for her.  Today, she does not report any excessive fatigue, but she has been having left hip pain. She reports a history of left hip OA (Xrays in our system from 2005 were normal) for which she sees Dr. Rip Harbour in [place] where she gets hip injections.  She notes the last one was 3 months ago.  She has tried PT but it is too expensive.  She has the exercises and knows she should be doing them.  Due to the increased pain she does not exercise as much and has gained about 10 pounds.  Her daughter has given her CBD oil which helps a bit and has recommended cleanses.  We discussed these today and she will not do a cleanse, but may try the CBD gummies once in a while.  She is also interested in acupuncture and we discussed the risk/benefits of this practice.   Her A1C is elevated today, which she attributes to her weight gain and inability to exercise.  She is willing to try and improve her hip pain with PT exercises and get her weight back under control.    Review of Systems  Constitutional: Positive for activity change. Negative for appetite change, fatigue and fever.  Respiratory: Negative for cough and shortness of breath.   Cardiovascular: Negative for chest pain.  Musculoskeletal: Positive for arthralgias. Negative for gait problem and joint swelling.  Neurological: Negative for dizziness, light-headedness and numbness.  Psychiatric/Behavioral: Negative for behavioral problems and confusion.       Objective:   Physical Exam  Constitutional: She appears well-developed and well-nourished. No  distress.  HENT:  Head: Normocephalic and atraumatic.  Cardiovascular: Normal rate, regular rhythm and normal heart sounds.  Pulmonary/Chest: Effort normal and breath sounds normal.  Abdominal: Soft. Bowel sounds are normal. She exhibits no distension. There is no tenderness.  Musculoskeletal: She exhibits no edema or tenderness.  She has mild internal hip pain with external rotation of the left hip.   Psychiatric: She has a normal mood and affect. Her behavior is normal.    No labs today.      Assessment & Plan:  RTC in 3 months

## 2017-07-23 NOTE — Patient Instructions (Signed)
Ms. Kosch - -  Please keep taking your medications as prescribed.   Weight loss will be very important to help bring down your A1C.  Please go see Dr. Rip Harbour about your hip to see if there are any other things you can do.  Physical therapy might be an option for you as well if your insurance will cover this.   Thank you  Come back to see me in 3 months, please do not eat prior to your blood work.

## 2017-07-23 NOTE — Assessment & Plan Note (Signed)
Last LDL was 66, which is well controlled.   Plan Continue atorvastatin.  I have asked her to come fasting to next appointment to check lipid panel.

## 2017-07-23 NOTE — Assessment & Plan Note (Signed)
We discussed at length today.  Only change is lack of exercise due to hip pain, but could also be some dietary issues as well.  Gave counseling on exercise and diet.  Consider nutrition consult at next visit.

## 2017-07-23 NOTE — Assessment & Plan Note (Signed)
This is a chronic problem for which she sees sports medicine and gets intermittent hip injections.  The injections help a lot, but she is in between them now and feeling increased pain.  She has only minimal pain on exam.  She has tried a lot of naturopathic remedies.  She has been to PT but cannot afford to go regularly ($45 a visit).  She has exercises they gave her.   Plan F/U with sports medicine Restart PT exercises Increase exercise She is not interested in medication at this time.

## 2017-07-23 NOTE — Assessment & Plan Note (Signed)
Control has worsened since last visit.  She has gained about 14 pounds in the last year.  She is aware that her lack of exercise is making this worse.  She notes that her diet has not changed much, but that her husband does bring home sweets such as donuts.  Her LDL is 66.  Her BP is well controlled.  Her renal function is stable.  She is requesting a trial of exercise, which has worked for her in the past.  We discussed the likely need to add medication if her A1C goes above 8.0.  She has no signs/symptoms of hypoglycemia.   Plan Continue pioglitozone-metformin combo RTC in 3 months

## 2017-07-23 NOTE — Assessment & Plan Note (Signed)
She reports good control.  She only takes medications as needed.

## 2017-07-23 NOTE — Assessment & Plan Note (Signed)
Her BP is well controlled today at 129/57.  She is taking olmesartan without issue.  Recent blood work showed normal renal function and normal potassium.   Plan Continue olmesartan

## 2017-08-01 ENCOUNTER — Telehealth: Payer: Self-pay

## 2017-08-01 NOTE — Telephone Encounter (Signed)
rtc to pt to ask what lab results she needed, ph rang and rang, no answer

## 2017-08-01 NOTE — Telephone Encounter (Signed)
Requesting test results. Please call pt back.  

## 2017-08-01 NOTE — Telephone Encounter (Signed)
We did not do any blood work at last visit, only A1C and that was discussed at the visit.   Can you ask her what tests she is referring to?   Thanks!  EBM

## 2017-08-05 NOTE — Telephone Encounter (Signed)
No answer again 

## 2017-08-05 NOTE — Telephone Encounter (Signed)
Thanks for trying, if she calls back hopefully we can find out what she was looking for.  THanks!

## 2017-08-11 ENCOUNTER — Emergency Department (HOSPITAL_COMMUNITY)
Admission: EM | Admit: 2017-08-11 | Discharge: 2017-08-11 | Disposition: A | Discharge status: Home / Self Care | Payer: Medicare Other | Attending: Physician Assistant | Admitting: Physician Assistant

## 2017-08-11 ENCOUNTER — Encounter (HOSPITAL_COMMUNITY): Payer: Self-pay

## 2017-08-11 ENCOUNTER — Other Ambulatory Visit: Payer: Self-pay

## 2017-08-11 ENCOUNTER — Emergency Department (HOSPITAL_COMMUNITY): Payer: Medicare Other

## 2017-08-11 DIAGNOSIS — I10 Essential (primary) hypertension: Secondary | ICD-10-CM | POA: Insufficient documentation

## 2017-08-11 DIAGNOSIS — J45909 Unspecified asthma, uncomplicated: Secondary | ICD-10-CM | POA: Insufficient documentation

## 2017-08-11 DIAGNOSIS — S79912A Unspecified injury of left hip, initial encounter: Secondary | ICD-10-CM | POA: Diagnosis not present

## 2017-08-11 DIAGNOSIS — M25552 Pain in left hip: Secondary | ICD-10-CM | POA: Diagnosis not present

## 2017-08-11 DIAGNOSIS — Z87891 Personal history of nicotine dependence: Secondary | ICD-10-CM | POA: Insufficient documentation

## 2017-08-11 DIAGNOSIS — E119 Type 2 diabetes mellitus without complications: Secondary | ICD-10-CM | POA: Insufficient documentation

## 2017-08-11 DIAGNOSIS — M542 Cervicalgia: Secondary | ICD-10-CM | POA: Insufficient documentation

## 2017-08-11 DIAGNOSIS — M546 Pain in thoracic spine: Secondary | ICD-10-CM | POA: Insufficient documentation

## 2017-08-11 DIAGNOSIS — Z79899 Other long term (current) drug therapy: Secondary | ICD-10-CM | POA: Diagnosis not present

## 2017-08-11 MED ORDER — ACETAMINOPHEN 325 MG PO TABS
650.0000 mg | ORAL_TABLET | Freq: Once | ORAL | Status: AC
  Administered 2017-08-11: 650 mg via ORAL
  Filled 2017-08-11: qty 2

## 2017-08-11 MED ORDER — NAPROXEN 375 MG PO TABS: 375.0000 mg | ORAL_TABLET | Freq: Two times a day (BID) | ORAL | 0 refills | Status: DC
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
## 2017-08-11 NOTE — ED Notes (Signed)
Pt able to ambulate without any difficulty

## 2017-08-12 ENCOUNTER — Other Ambulatory Visit: Payer: Self-pay

## 2017-08-18 ENCOUNTER — Encounter: Payer: Self-pay | Admitting: Internal Medicine

## 2017-08-18 ENCOUNTER — Encounter (INDEPENDENT_AMBULATORY_CARE_PROVIDER_SITE_OTHER): Payer: Self-pay

## 2017-08-18 DIAGNOSIS — E119 Type 2 diabetes mellitus without complications: Secondary | ICD-10-CM

## 2017-08-18 DIAGNOSIS — E669 Obesity, unspecified: Secondary | ICD-10-CM

## 2017-08-18 DIAGNOSIS — Z794 Long term (current) use of insulin: Principal | ICD-10-CM

## 2017-08-22 ENCOUNTER — Encounter: Payer: Self-pay | Admitting: Internal Medicine

## 2017-08-22 ENCOUNTER — Ambulatory Visit (INDEPENDENT_AMBULATORY_CARE_PROVIDER_SITE_OTHER): Payer: Self-pay | Admitting: Internal Medicine

## 2017-08-22 VITALS — BP 155/63 | HR 66 | Temp 98.0°F | Wt 215.5 lb

## 2017-08-22 DIAGNOSIS — G8911 Acute pain due to trauma: Secondary | ICD-10-CM

## 2017-08-22 DIAGNOSIS — M159 Polyosteoarthritis, unspecified: Secondary | ICD-10-CM

## 2017-08-22 DIAGNOSIS — M25562 Pain in left knee: Secondary | ICD-10-CM

## 2017-08-22 DIAGNOSIS — M545 Low back pain: Secondary | ICD-10-CM

## 2017-08-22 DIAGNOSIS — F411 Generalized anxiety disorder: Secondary | ICD-10-CM | POA: Insufficient documentation

## 2017-08-22 DIAGNOSIS — J45909 Unspecified asthma, uncomplicated: Secondary | ICD-10-CM

## 2017-08-22 DIAGNOSIS — F410 Panic disorder [episodic paroxysmal anxiety] without agoraphobia: Secondary | ICD-10-CM

## 2017-08-22 DIAGNOSIS — E785 Hyperlipidemia, unspecified: Secondary | ICD-10-CM

## 2017-08-22 DIAGNOSIS — N3946 Mixed incontinence: Secondary | ICD-10-CM

## 2017-08-22 DIAGNOSIS — Z791 Long term (current) use of non-steroidal anti-inflammatories (NSAID): Secondary | ICD-10-CM

## 2017-08-22 DIAGNOSIS — I10 Essential (primary) hypertension: Secondary | ICD-10-CM

## 2017-08-22 DIAGNOSIS — G4700 Insomnia, unspecified: Secondary | ICD-10-CM

## 2017-08-22 DIAGNOSIS — E119 Type 2 diabetes mellitus without complications: Secondary | ICD-10-CM

## 2017-08-22 DIAGNOSIS — F515 Nightmare disorder: Secondary | ICD-10-CM

## 2017-08-22 HISTORY — DX: Panic disorder (episodic paroxysmal anxiety): F41.0
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
## 2017-08-22 NOTE — Telephone Encounter (Signed)
Patient seen in [facility] 08/22/2017. Hubbard Hartshorn, RN, BSN
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
## 2017-08-22 NOTE — Assessment & Plan Note (Addendum)
-   Hx of urinary incontinence and pt states she has tried kegal exercise unsuccessfully - No leakage with cough, lifting or pressure - Denies saddle anesthesia or bowel incontinence or pain radiating to legs - Pt states she has been waking up every 2 hours to urinate at night, but is fine otherwise during daytime - Pt also states she has been reducing her fluid consumption to compensate - History of 5 vaginal deliveries for her children - Patient states she is not interested in pharmaceutical interventions for incontinence - Recommend timed toileting and reconnecting with patient's gynecologist assessment for stress incontinence
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
## 2017-08-26 ENCOUNTER — Encounter: Payer: Self-pay | Admitting: Internal Medicine

## 2017-09-01 DIAGNOSIS — M25552 Pain in left hip: Secondary | ICD-10-CM | POA: Diagnosis not present

## 2017-09-10 ENCOUNTER — Encounter: Payer: Medicare Other | Admitting: Dietician

## 2017-09-10 ENCOUNTER — Encounter: Payer: Medicare Other | Admitting: Internal Medicine

## 2017-09-10 ENCOUNTER — Encounter: Payer: Self-pay | Admitting: Internal Medicine

## 2017-09-12 DIAGNOSIS — M25552 Pain in left hip: Secondary | ICD-10-CM | POA: Diagnosis not present

## 2017-09-14 ENCOUNTER — Encounter: Payer: Self-pay | Admitting: Internal Medicine

## 2017-09-15 DIAGNOSIS — M7062 Trochanteric bursitis, left hip: Secondary | ICD-10-CM | POA: Diagnosis not present

## 2017-09-15 DIAGNOSIS — M25552 Pain in left hip: Secondary | ICD-10-CM | POA: Diagnosis not present

## 2017-09-16 ENCOUNTER — Encounter: Payer: Self-pay | Admitting: Dietician

## 2017-09-16 ENCOUNTER — Ambulatory Visit (INDEPENDENT_AMBULATORY_CARE_PROVIDER_SITE_OTHER): Payer: Medicare Other | Admitting: Dietician

## 2017-09-16 DIAGNOSIS — Z6834 Body mass index (BMI) 34.0-34.9, adult: Secondary | ICD-10-CM | POA: Diagnosis not present

## 2017-09-16 DIAGNOSIS — E119 Type 2 diabetes mellitus without complications: Secondary | ICD-10-CM

## 2017-09-16 DIAGNOSIS — Z713 Dietary counseling and surveillance: Secondary | ICD-10-CM

## 2017-09-16 DIAGNOSIS — Z794 Long term (current) use of insulin: Secondary | ICD-10-CM

## 2017-09-16 NOTE — Patient Instructions (Addendum)
Your goal is  to find a therapist-  to do that I will contact  at least 2 places in the next 24 hours.   Be kind to your body when it comes to activity- a little at a time- it is okay to be uncomfortable not in pain.   [facility] [address], [place] [address] Phone-  [phone] Fax- [phone]  Google Cristie Hem - self acceptance  My guess is that you should shoot for about 70 grams of protein a day.

## 2017-09-16 NOTE — Progress Notes (Signed)
  Medical Nutrition Therapy:  Appt start time: 0825 end time:  0930. Visit # 1  Assessment:  Primary concerns today: weight loss,counseling for anxiety.  [name] is here for help with weight loss. She has gained 40 pounds since 2013. She is working on getting counseling for anxiety and feeling out of control and tearful. She is also struggling emotionally with the change in her body image with the weight gain as well as other issues. She has a supportive daughter who lives nearby. She cooks for her spouse and herself. She feels she is eating healthy choices and does not quantify the amounts or calories or read labels. She rerports often feeling tired during the day. Usual physical activity: just started walking in gym yesterday, plans to continue daily- has hip pain and is contemplating water aerobics SLEEP:7-9 hours a night Preferred Learning Style: No preference indicated  Learning Readiness: Ready and Change in progress  ANTHROPOMETRICS: Estimated body mass index is 34.13 kg/m as calculated from the following:   Height as of 08/11/17: 5\' 7"  (1.702 m).   Weight as of this encounter: 217 lb 14.4 oz (98.8 kg).WEIGHT HISTORY: was 177# in 2013 Wt Readings from Last 5 Encounters:  09/16/17 217 lb 14.4 oz (98.8 kg)  08/22/17 215 lb 8 oz (97.8 kg)  08/11/17 212 lb (96.2 kg)  07/23/17 215 lb (97.5 kg)  03/26/17 211 lb 8 oz (95.9 kg)    MEDICATIONS: pioglitazone causes weight gain BLOOD SUGAR: Lab Results  Component Value Date   HGBA1C 7.7 (A) 07/23/2017   DIETARY INTAKE: Usual eating pattern includes 2-3 meals and 1-2 snacks per day. Food Intolerances: none discussed today, but she eats little meat/red meat in particular Constipation: need to assess at future visit     Dining Out (times/week): 0-1  24-hr recall:  B ( 530 AM): coffee with 2 teaspoons sugar, cream 630 am - smoothie with 2 cups fruit, 1 c chocolate almond milk , 2 Tbsp sea moss, 1 scop protein powder L ( PM): skips 3 days  a week or has a bag of kale or southwestern salad mix/pimento cheese sandwich on a croissant, veggie burger from [employer] Snk (afternoon): sometimes fruit D (7-8 PM): salmon/shrimp/chicken 3zx/week, vegtables and starch- potato, pasta or rice Snk ( PM): cherriesBeverages: water  Estimated daily energy needs:   maintain current weight, 2,180Calories/day To reach goal of 200 lbs in 180 days, 1,761 Calories/day To maintain your goal of 200 lbs, you should eat:2,124 Calories/day Protein needs- 80grams/day without calorie deficit- 100-116g/day with calorie deficit for weight loss  Progress Towards Goal(s):  In progress.   Nutritional Diagnosis:  NB-1.4 Self-monitoring deficit As related to not aware of caloric content of foods.  As evidenced by her report of not reading labels or being aware of what is in th efoods she eats. .    Intervention:  Nutrition counseling and education about weight loss, diabetes medications that cause weight gain, changes that occur with age in our metabolism nutrient needs. . Coordination of care: suggest discussion of medications side effect aimed at weight neutral or loss per patient's goal (GLP-1 vs, DPP-4 instead of pioglitazone)  Teaching Method Utilized: Visual,Auditory,Hands on Handouts given during visit include: Barriers to learning/adherence to lifestyle change: competing values/ emotions Demonstrated degree of understanding via:  Teach Back   Monitoring/Evaluation:  Dietary intake, exercise, and body weight in 2 week(s). Alyssa Haynes, Alyssa Haynes 09/16/2017 9:59 AM.

## 2017-09-22 ENCOUNTER — Encounter: Payer: Self-pay | Admitting: Internal Medicine

## 2017-09-30 ENCOUNTER — Ambulatory Visit (INDEPENDENT_AMBULATORY_CARE_PROVIDER_SITE_OTHER): Payer: Medicare Other | Admitting: Dietician

## 2017-09-30 ENCOUNTER — Encounter: Payer: Self-pay | Admitting: Dietician

## 2017-09-30 DIAGNOSIS — E119 Type 2 diabetes mellitus without complications: Secondary | ICD-10-CM

## 2017-09-30 DIAGNOSIS — Z794 Long term (current) use of insulin: Secondary | ICD-10-CM

## 2017-09-30 DIAGNOSIS — Z713 Dietary counseling and surveillance: Secondary | ICD-10-CM | POA: Diagnosis not present

## 2017-09-30 LAB — GLUCOSE, CAPILLARY: Glucose-Capillary: 190 mg/dL — ABNORMAL HIGH (ref 70–99)

## 2017-09-30 NOTE — Progress Notes (Addendum)
  Medical Nutrition Therapy:  Appt start time: 0830 end time:  0920. Visit # 2  Assessment:  Primary concerns today: weight loss.  Alyssa Haynes is here for help with weight loss. She met her goal of fiding a counselor for her anxiety. She has met with the counselor and has a follow up appointment scheduled. She is tearful today. She says someone from [facility] came to her house and measured her a1c and now it is a "10". This information was surprising and is upsetting to her. She says diabetes has been a long and constant struggle for her since 2006. She wakes daily with low energy and reports having low energy throughout the day.  Usual physical activity: yoga at [facility] weekly. Is thinking about walking, setting alarms on her phone to help her eat on time. It sounds like she struggles with weighing daily as this is a negative   Blood sugar checked toda was 190 after coffee with cream and no diabetes medicine. No meter today. Unsure how frequently she checks her blood sugar.  ANTHROPOMETRICS: Deferred weight today- she weighs daily at home and reports it was 216# this am.   DIETARY INTAKE: Usual eating pattern includes 2-3 meals and 1-2 snacks per day- may go long periods of time without eating. Eating more fruit recently and knows this makes her blood sugars increase. Feels she should be eating more on a schedule Constipation: reports BMs daily    24-hr recall not done today.  Estimated daily energy needs:   maintain current weight, 2,180Calories/day To reach goal of 200 lbs in 180 days, 1,761 Calories/day To maintain your goal of 200 lbs, you should eat:2,124 Calories/day Protein needs- 80grams/day without calorie deficit- 100-116g/day with calorie deficit for weight loss  Progress Towards Goal(s):  In progress.   Nutritional Diagnosis:  NB-1.4 Self-monitoring deficit As related to not aware of caloric content of foods or blood sugars  As evidenced by her report of not reading labels  or being aware of what is in the foods she eats or what her blood sugars are.    Intervention:  Nutrition counseling and education about goals setting, GLP-1 medication, signs and symptoms of hyperglycemia and education about diabetes distress and support Coordination of care: suggest discussion of medications side effect aimed at weight neutral or loss per patient's goal (GLP-1 vs SGLT-2 instead of pioglitazone) Teaching Method Utilized: Visual,Auditory,Hands on Handouts given during visit include: Barriers to learning/adherence to lifestyle change: competing values/ emotions Demonstrated degree of understanding via:  Teach Back   Monitoring/Evaluation:  Dietary intake, exercise, and body weight in 2 week(s).or after she sees Dr. Renato Shin Plyler, RD 09/30/2017 9:58 AM.

## 2017-09-30 NOTE — Patient Instructions (Addendum)
Dear Wannetta Sender,  Good work on finding a counselor! This seemed to be your main goal and you did it! Yeah!  It's okay to take a break from weighing yourself daily. One time a week is fine if you decide to weigh yourself at all right now.  It is okay and good to protect ourselves from things that affect [place] negatively until we feel filled up enough to handle them.    Take care,  Butch Penny [phone]

## 2017-10-02 ENCOUNTER — Other Ambulatory Visit: Payer: Self-pay

## 2017-10-02 NOTE — Patient Outreach (Signed)
[facility]) Care Management  10/02/2017  Alyssa Haynes 1945-06-26 [id number]   Medication Adherence call to Alyssa Haynes patient is due on Olmesartan 5 mg spoke with patient she is taking it on a regular basis but, is almost out, help patient to order her medication from [place] patient also wants a 90 days supply [facility] for a 90 days supply and will have it ready for the patient, she had more questions regarding her other medication patient is going to be refer to the pharmacist(Katina). Alyssa Haynes is showing past due under [place].   [facility] Management Direct Dial [phone]  Fax [phone] [email or web address]

## 2017-10-08 ENCOUNTER — Other Ambulatory Visit: Payer: Self-pay

## 2017-10-08 ENCOUNTER — Encounter: Payer: Self-pay | Admitting: Internal Medicine

## 2017-10-08 ENCOUNTER — Ambulatory Visit (INDEPENDENT_AMBULATORY_CARE_PROVIDER_SITE_OTHER): Payer: Medicare Other | Admitting: Internal Medicine

## 2017-10-08 VITALS — BP 144/67 | HR 70 | Temp 98.0°F | Ht 68.0 in | Wt 219.2 lb

## 2017-10-08 DIAGNOSIS — Z7984 Long term (current) use of oral hypoglycemic drugs: Secondary | ICD-10-CM

## 2017-10-08 DIAGNOSIS — E119 Type 2 diabetes mellitus without complications: Secondary | ICD-10-CM

## 2017-10-08 DIAGNOSIS — Z794 Long term (current) use of insulin: Secondary | ICD-10-CM

## 2017-10-08 LAB — POCT GLYCOSYLATED HEMOGLOBIN (HGB A1C): Hemoglobin A1C: 7.9 % — AB (ref 4.0–5.6)

## 2017-10-08 LAB — GLUCOSE, CAPILLARY: Glucose-Capillary: 155 mg/dL — ABNORMAL HIGH (ref 70–99)

## 2017-10-08 MED ORDER — CANAGLIFLOZIN 100 MG PO TABS: 100.0000 mg | ORAL_TABLET | Freq: Every day | ORAL | 5 refills | Status: DC

## 2017-10-08 NOTE — Assessment & Plan Note (Signed)
Type 2 diabetes mellitus: Alyssa Haynes has a long-standing history of diabetes and currently on pioglitazone-metformin combo which was started in February 2018.  She states that since starting medication she has had trouble losing weight despite exercising.  She reports of walking 1 mile per day and participating in yoga 3 times per week.  She recently saw the clinic nutrition Butch Penny and reports that she has been compliant with all the recommendation that was given.  A1c today at clinic of 7.9% from 7.7%  3 months ago.  Home blood glucose measurement with range 130s to 220s.  Pioglitazone has a listed side effect of weight gain and on further discussion with the patient I would like to start her on an SGLT-4 inhibitor today.  Also, this class of medication has not added benefit of weight loss which I believe will will benefit patient.  She does not report any history of current yeast infections.  -Start Invokana 100 mg daily with possibility of increase to 300 mg daily if blood glucose not at goal. -Side effect profile of medication including gynecological infection, dizziness has been discussed with patient.

## 2017-10-08 NOTE — Patient Instructions (Signed)
Ms. Drummer,  It was a pleasure taking care of you here the clinic today and I congratulate you for keeping up with your exercise, yoga and the recommendations that was provided to you by the nutritionist.  Here are my recommendations after today's visit:   1. I am starting you on a new diabetes medication called Invokana 100 mg.  Take 1 tab daily before breakfast.  As we discussed this medication has a rare side effect of yeast infection.  Also another addition to this medication is that it can help you with the weight loss.  2.  I will you to discontinue the pioglitazone  3.  Please keep a log of your home blood glucose measurements and bring it to your next appointment with Dr. Daryll Drown on 11/05/2017.    ~Dr. Eileen Stanford

## 2017-10-08 NOTE — Progress Notes (Signed)
   CC: To discuss changing medication  HPI:  [name] is a 72 y.o. with medical history as listed below presenting to discuss changing her diabetes medication.  Please see problem based charting below for additional details.  Type 2 diabetes mellitus: [name] has a long-standing history of diabetes and currently on pioglitazone-metformin combo which was started in February 2018.  She states that since starting medication she has had trouble losing weight despite exercising.  She reports of walking 1 mile per day and participating in yoga 3 times per week.  She recently saw the clinic nutrition Butch Penny and reports that she has been compliant with all the recommendation that was given.  A1c today at clinic of 7.9% from 7.7%  3 months ago.  Home blood glucose measurement with range 130s to 220s.  Pioglitazone has a listed side effect of weight gain and on further discussion with the patient I would like to start her on an SGLT-4 inhibitor today.  Also, this class of medication has not added benefit of weight loss which I believe will will benefit patient.  She does not report any history of current yeast infections.  -Start Invokana 100 mg daily with possibility of increase to 300 mg daily if blood glucose not at goal. -Side effect profile of medication including gynecological infection, dizziness has been discussed with patient.  Past Medical History:  Diagnosis Date  . Anxiety attack 08/22/2017  . Asthma   . Diabetes mellitus   . GERD (gastroesophageal reflux disease)   . Hyperlipidemia   . Hypertension    Review of Systems: As per HPI  Physical Exam:  Vitals:   10/08/17 1325  BP: (!) 144/67  Pulse: 70  Temp: 98 F (36.7 C)  TempSrc: Oral  SpO2: 100%  Weight: 219 lb 3.2 oz (99.4 kg)  Height: 5\' 8"  (1.727 m)   Constitutional: In no apparent distress, very pleasant woman Cardiovascular: Normal S1 and S2.  No murmurs, gallops, rubs Respiratory: Clear to auscultation  bilaterally.  No wheezes, crackles, rhonchi Abdomen: Bowel sounds present, soft, nondistended, nontender to palpation.  Assessment & Plan:   See Encounters Tab for problem based charting.  Patient discussed with Dr. Lynnae January

## 2017-10-10 ENCOUNTER — Other Ambulatory Visit: Payer: Self-pay | Admitting: Pharmacist

## 2017-10-10 NOTE — Progress Notes (Signed)
Internal Medicine Clinic Attending  I saw and evaluated the patient.  I personally confirmed the key portions of the history and exam documented by Dr. Agyei and I reviewed pertinent patient test results.  The assessment, diagnosis, and plan were formulated together and I agree with the documentation in the resident's note.  

## 2017-10-10 NOTE — Patient Outreach (Signed)
[facility]) Care Management  10/10/2017   Alyssa Haynes 1945/04/25 [id number]  Subjective:   72 year old female referred to [facility] [facility] by [facility] for medication adherence.  [facility] services requested for medication education after an adherence call was placed by our pharmacy technician [facility].  PMHx includes, but not limited to, type 2 diabetes, hypertension, osteoarthritis of the hip.   Patient in good spirits today during phone call encounter. HIPAA verifiers identified. She noted that she saw Dr. Eileen Stanford in her PCP's office 2 days ago due to questions/concerns about pioglitazone. She notes that she has experienced troublesome weight gain, and wondered if it was related to pioglitazone. Dr. Eileen Stanford discontinued pioglitazone and started Invokana (canagliflozin) 100 mg daily. Alyssa Haynes noted that she has not picked up Invokana yet as the pharmacy had to order it. She notes that she has heard of injectable non-insulin agents for diabetes (GLP1 agonists) and their success with weight loss. She was also curious about her most recent A1c result. She stated that a nurse came out to her house a few weeks ago and checked an A1c that was "in the 10s".  While reviewing her medications, she noted that she is only taking naproxen PRN for hip-related pain. She had been taking some of her husband's Vitamin D prescription, but has stopped. She notes that she feels more tired recently, so has started taking sublingual vitamin B12 drops.    Objective:   Current Medications:  Current Outpatient Medications  Medication Sig Dispense Refill  . atorvastatin (LIPITOR) 20 MG tablet TAKE 1 TABLET BY MOUTH ONCE DAILY 90 tablet 1  . Blood Glucose Monitoring Suppl (ACCU-CHEK AVIVA PLUS) w/Device KIT Use to check blood sugar 2 times daily before meals. diag code E11.9. noninsulin dependent 1 kit 0  . canagliflozin (INVOKANA) 100 MG TABS tablet Take 1 tablet (100 mg total) by mouth  daily before breakfast. 30 tablet 5  . Cobalamine Combinations (B-12) 1000-400 MCG SUBL Place under the tongue.    Kendall Flack 575 MG/5ML SYRP Take 1 drop by mouth daily.    Marland Kitchen glucose blood (ACCU-CHEK AVIVA PLUS) test strip Use to check blood sugar 2 times daily. diag code E11.9. noninsulin dependent 200 each 1  . Melatonin 5 MG CHEW Chew 1 tablet by mouth daily as needed.    . naproxen (NAPROSYN) 375 MG tablet Take 1 tablet (375 mg total) by mouth 2 (two) times daily. 20 tablet 0  . olmesartan (BENICAR) 5 MG tablet Take 2 tablets (10 mg total) by mouth daily. 60 tablet 6  . cyanocobalamin 500 MCG tablet Take 500 mcg by mouth daily.    . cyclobenzaprine (FLEXERIL) 10 MG tablet Take 1 tablet (10 mg total) by mouth 2 (two) times daily as needed for muscle spasms. (Patient not taking: Reported on 10/10/2017) 20 tablet 0  . Ginkgo Biloba (GNP GINGKO BILOBA EXTRACT PO) Take 1 tablet by mouth daily.    Marland Kitchen oxyCODONE-acetaminophen (PERCOCET/ROXICET) 5-325 MG tablet Take 1 tablet by mouth every 4 (four) hours as needed for severe pain. (Patient not taking: Reported on 10/10/2017) 15 tablet 0  . traMADol (ULTRAM) 50 MG tablet      No current facility-administered medications for this visit.     Functional Status:  In your present state of health, do you have any difficulty performing the following activities: 10/08/2017 07/23/2017  Hearing? N N  Vision? N N  Difficulty concentrating or making decisions? N N  Walking or climbing  stairs? Y Y  Comment - -  Dressing or bathing? N N  Doing errands, shopping? N N  Some recent data might be hidden    Fall/Depression Screening: Fall Risk  10/08/2017 08/22/2017 08/22/2017  Falls in the past year? Yes Yes Yes  Number falls in past yr: 2 or more 1 1  Comment - - -  Injury with Fall? Yes - No  Comment - - -  Risk Factor Category  High Fall Risk - -  Risk for fall due to : History of fall(s);Impaired balance/gait;Impaired mobility - -  Follow up Falls prevention  discussed Education provided Falls prevention discussed   PHQ 2/9 Scores 10/08/2017 08/22/2017 07/23/2017 03/26/2017 12/25/2016 09/30/2016 09/25/2016  PHQ - 2 Score 1 3 0 0 0 0 0  PHQ- 9 Score 4 10 - - - - -   BMP Latest Ref Rng & Units 03/26/2017 09/20/2016 07/10/2016  Glucose 65 - 99 mg/dL 118(H) 164(H) 157(H)  BUN 8 - 27 mg/dL '11 11 11  '$ Creatinine 0.57 - 1.00 mg/dL 0.81 0.98 0.98  BUN/Creat Ratio 12 - 28 14 11(L) 11(L)  Sodium 134 - 144 mmol/L 141 144 141  Potassium 3.5 - 5.2 mmol/L 4.3 4.1 4.0  Chloride 96 - 106 mmol/L 106 106 102  CO2 20 - 29 mmol/L '21 24 23  '$ Calcium 8.7 - 10.3 mg/dL 9.5 9.5 9.4   CrCl: ~ 80 mL/min using adjusted body weight   Lipid Panel     Component Value Date/Time   CHOL 143 05/15/2016 1101   TRIG 81 05/15/2016 1101   HDL 61 05/15/2016 1101   CHOLHDL 2.3 05/15/2016 1101   CHOLHDL 3.2 04/28/2012 1627   VLDL 29 04/28/2012 1627   LDLCALC 66 05/15/2016 1101    10-year ASCVD Risk Score: 23.9% (using most recent BP 144/67 and most recent lipid panel from 05/15/2017)  Assessment:  Drugs sorted by system:  Cardiovascular: atorvastatin, olmesartan  Endocrine: Invokana (canagliflozin) 100 mg daily  Pain: naproxen PRN  Vitamins/Minerals/Supplements: vitamin B12, elderberry syrup, melatonin PRN  Disease Assessment  Diabetes - As pioglitazone was prescribed in combination with metformin, metformin component was discontinued when pioglitazone was. Consider resuming metformin at optimized 2000 mg daily dose. Renal function is appropriate for metformin initiation.  - If patient has tolerability concerns with SGLT2 therapy, could consider GLP1 agonist therapy, particularly as these agents may result in greater weight loss  Cardiovascular Risk Reduction - Patient with current 10-year ASCVD risk score >20%. Per 2019 ADA guidelines, patient qualifies for high intensity statin therapy. Consider increasing to atorvastatin 40 mg daily.  - Patient with current 10-year ASCVD risk  score >15%. Per 2019 ADA guidelines, BP goal <130/80 may be appropriate. Once SGLT2 therapy is optimized and full diuretic benefit derived, could consider increasing olmesartan dose.   Plan:  - Provided education to patient regarding purpose/side effects of Vitamin D and Vitamin B12 supplementation. Encouraged her to discuss necessity of therapy with PCP Dr. Daryll Drown - Counseled patient on mechanism of action, benefits, and side effects of SGLT2 therapy. Encouraged to remain well hydrated while on SGLT2 therapy to minimize risk of dehydration, GU infections.  - Related updated A1c result to patient.  - Will contact PCP Dr. Daryll Drown and Dr. Eileen Stanford regarding intentions for metformin resumption and relay this information to Alyssa Haynes.    [name], PharmD PGY2 Ambulatory Care Pharmacy Resident Phone: [phone]

## 2017-10-11 ENCOUNTER — Telehealth: Payer: Self-pay | Admitting: Internal Medicine

## 2017-10-11 ENCOUNTER — Other Ambulatory Visit: Payer: Self-pay | Admitting: Internal Medicine

## 2017-10-11 DIAGNOSIS — Z794 Long term (current) use of insulin: Principal | ICD-10-CM

## 2017-10-11 DIAGNOSIS — E119 Type 2 diabetes mellitus without complications: Secondary | ICD-10-CM

## 2017-10-11 MED ORDER — METFORMIN HCL 500 MG PO TABS: 500.0000 mg | ORAL_TABLET | Freq: Two times a day (BID) | ORAL | 11 refills | Status: DC

## 2017-10-11 MED ORDER — CANAGLIFLOZIN 100 MG PO TABS: 100.0000 mg | ORAL_TABLET | Freq: Every day | ORAL | 3 refills | Status: DC

## 2017-10-11 NOTE — Telephone Encounter (Signed)
I received a message from pharmacist Dr. Darnelle Maffucci regarding starting patient on metformin.  She was previously on pioglitazone-metformin 15-80 mg twice daily which was discontinued at her last clinic visit on 10/08/2017 due to difficulty losing weight felt to be related to pioglitazone.  She was asked to discontinue pioglitazone-metformin and start Invokana 100 mg daily.  I think it is appropriate to start her on metformin 500 mg twice daily while continuing Invokana 100 mg twice daily.   I called patient and discussed with her the recommendation and changes made.  She made me aware of not being able to pick up her Invokana because the [place] [facility] [address] had ran out of [place] and was told with the medication will be forwarded to [employer] but has still not received a call to pick up the medication.  Discussion summary with patient: - Continue Invokana 100 mg daily - Start metformin 500 mg daily - Follow-up at clinic on 11/05/2017.

## 2017-10-15 ENCOUNTER — Other Ambulatory Visit: Payer: Self-pay | Admitting: Pharmacist

## 2017-10-15 ENCOUNTER — Other Ambulatory Visit: Payer: Self-pay | Admitting: Internal Medicine

## 2017-10-15 NOTE — Telephone Encounter (Signed)
Needs refill on naproxen (NAPROSYN) 375 MG tablet @ [place]; pt contact# [phone].Marland Kitchen   Pt would also like Dr Daryll Drown to call her

## 2017-10-15 NOTE — Patient Outreach (Signed)
[facility]) Care Management  10/15/2017  Alyssa Haynes 23-Feb-1945 [id number]  72 year old female referred to [place] [facility] by [facility] for medication adherence/assistance.  Alyssa Haynes services requested for medication education after an adherence call was placed by our pharmacy technician [facility].  PMHx includes, but not limited to, type 2 diabetes, hypertension, osteoarthritis of the hip.   Contacted Alyssa Haynes today to f/u. She was contacted by Dr. Eileen Stanford and a new prescription for metformin was sent in. However, she notes that the pharmacy informed her that the copay for Invokana is $45. She believes this would be a financial strain for her and her husband.   Medication Assistance: Extra Help: - Discussed household income with patient. Her monthly income from social security and her husband's income are too high to qualify for [employer] [place]  Patient Assistance Programs: - Invokana ([place]): This program requires that patient to spend 4% out of pocket (OOP) on prescription drugs this calendar year. She has not met this spend, and it is unlikely that she will do so this calendar year.  Alternative SGLT2 Medications - Jardiance (empagliflozin) is also covered on her [facility]. [employer] does not have an OOP requirement. Switching patient to [facility] and submitting this patient assistance application may be a possibility.   Plan:  - Reached out to Flossie Dibble, PharmD, clinical pharmacist in the patient's PCP's office to ask if they have any Invokana samples. Ms. Haynes would be more comfortable trying a sample of the medication to ensure she tolerates it, prior to spending $45/month for the medication - If this is not an option, will consider pursuing a therapy change to Jardiance and submitting an application to [place] patient assistance program.  - Reiterated counseling points for metformin and  SGLT2 therapy to the patient    Alyssa Haynes, PharmD PGY2 Ambulatory Care Pharmacy Resident Phone: [phone]

## 2017-10-16 MED ORDER — NAPROXEN 375 MG PO TABS: 375.0000 mg | ORAL_TABLET | Freq: Two times a day (BID) | ORAL | 0 refills | Status: DC | PRN

## 2017-10-16 NOTE — Telephone Encounter (Signed)
Called patient.  Confirmed identity with name and birthdate.   Patient merely wanted to discuss refill of naproxen for hip pain.  She sounds like she is taking this reasonably.  She had 20 tabs which have lasted her 2 months.  Reviewed hip xray from June which showed mild narrowing of joint space.  I advised that if she finds she starts needing the medication daily, to let me know and we will change her to a different NSAID such as celebrex.  Her renal function has been normal.  She has no reflux or GI bleeding listed on her problem list.  She does not have a history of CAD which is known at this time.   Refill Naproxen, discussed safety with her.   Follow up as planned  Gilles Chiquito, MD

## 2017-10-17 ENCOUNTER — Encounter: Payer: Self-pay | Admitting: Internal Medicine

## 2017-10-20 NOTE — Telephone Encounter (Signed)
I called patient to discuss the earlier message she sent regarding the metformin dose. On further review, pharmacy had recommended that we optimize metformin up to 2000mg  daily. However we had started her on Invokana 100mg  daily and wanted to slowly titrate up her metformin based on CBG and A1c at the clinic follow up.  She had challenges obtaining invokana due to high co-pay but received assistance from pharmacy and has been taking the medication. I advice her to take Metformin 500mg  BID and follow up at the clinic.

## 2017-10-24 DIAGNOSIS — R350 Frequency of micturition: Secondary | ICD-10-CM | POA: Diagnosis not present

## 2017-10-28 ENCOUNTER — Other Ambulatory Visit: Payer: Self-pay | Admitting: Pharmacist

## 2017-10-28 NOTE — Patient Outreach (Signed)
[facility]) Care Management  10/28/2017  Alyssa Haynes 1945-04-23 [id number]   Contacted [name] to discuss if she has picked up Invokana from the pharmacy. Left HIPAA compliant message for her to return my call.   [name], PharmD PGY2 Ambulatory Care Pharmacy Resident Phone: [phone]

## 2017-10-31 ENCOUNTER — Other Ambulatory Visit: Payer: Self-pay | Admitting: Pharmacist

## 2017-10-31 NOTE — Patient Outreach (Signed)
[facility]) Care Management  10/31/2017  Alyssa Haynes Oct 21, 1945 [id number]  Contacted patient to f/u on financial concerns with Invokana. HIPAA verifiers identified. She noted that she and her husband had decided that they could afford the Invokana copay and did not need financial assistance.   She has been taking metformin 500 mg BID and Invokana 100 mg daily for ~ 3 weeks now. She reports fasting SMBG ~130s and afternoon SMBG ~160s, though did not quantify how long after a meal the afternoon readings are. She is very pleased with the improvement in her blood sugar control.   She did note that she has a cold right now, and has been using Alka Seltzer Plus Cold and Cough (acetaminophen 325 mg, chlorpheniramine 2 mg, dextromethorphan 10 mg, phenylephrine 5 mg). I explained the ingredients and how they worked, as well as advised her not to take additional acetaminophen to exceed 4 g daily. We also discussed the importance of remaining well hydrated, particularly with the antihistamine/decongestant ingredients and her recent addition of SGLT2 therapy.   She asked if increasing metformin 500 mg BID to 1000 mg BID would replace Invokana therapy. I recommended that she speak to her doctor about their plans with the metformin titration, but explained that typically these medications will continue to be used in combination to improve SMBG control, as well as provide ASCVD risk reduction and renoprotection.  Patient was grateful for the outreach and education provided.   Plan - Will route note to PCP, close case   [name], PharmD PGY2 Ambulatory Care Pharmacy Resident Phone: [phone]

## 2017-11-05 ENCOUNTER — Encounter: Payer: Self-pay | Admitting: Internal Medicine

## 2017-11-05 ENCOUNTER — Other Ambulatory Visit: Payer: Self-pay

## 2017-11-05 ENCOUNTER — Ambulatory Visit (INDEPENDENT_AMBULATORY_CARE_PROVIDER_SITE_OTHER): Payer: Medicare Other | Admitting: Internal Medicine

## 2017-11-05 VITALS — BP 140/70 | HR 76 | Temp 98.1°F | Ht 67.0 in | Wt 208.1 lb

## 2017-11-05 DIAGNOSIS — Z7984 Long term (current) use of oral hypoglycemic drugs: Secondary | ICD-10-CM

## 2017-11-05 DIAGNOSIS — E669 Obesity, unspecified: Secondary | ICD-10-CM

## 2017-11-05 DIAGNOSIS — E1169 Type 2 diabetes mellitus with other specified complication: Secondary | ICD-10-CM

## 2017-11-05 DIAGNOSIS — R350 Frequency of micturition: Secondary | ICD-10-CM | POA: Diagnosis not present

## 2017-11-05 DIAGNOSIS — Z79899 Other long term (current) drug therapy: Secondary | ICD-10-CM

## 2017-11-05 DIAGNOSIS — Z0000 Encounter for general adult medical examination without abnormal findings: Secondary | ICD-10-CM

## 2017-11-05 DIAGNOSIS — G4700 Insomnia, unspecified: Secondary | ICD-10-CM

## 2017-11-05 DIAGNOSIS — E113299 Type 2 diabetes mellitus with mild nonproliferative diabetic retinopathy without macular edema, unspecified eye: Secondary | ICD-10-CM

## 2017-11-05 DIAGNOSIS — Z23 Encounter for immunization: Secondary | ICD-10-CM

## 2017-11-05 DIAGNOSIS — E785 Hyperlipidemia, unspecified: Secondary | ICD-10-CM | POA: Diagnosis not present

## 2017-11-05 DIAGNOSIS — I10 Essential (primary) hypertension: Secondary | ICD-10-CM

## 2017-11-05 DIAGNOSIS — M8580 Other specified disorders of bone density and structure, unspecified site: Secondary | ICD-10-CM

## 2017-11-05 DIAGNOSIS — F410 Panic disorder [episodic paroxysmal anxiety] without agoraphobia: Secondary | ICD-10-CM

## 2017-11-05 DIAGNOSIS — N83202 Unspecified ovarian cyst, left side: Secondary | ICD-10-CM

## 2017-11-05 DIAGNOSIS — N3946 Mixed incontinence: Secondary | ICD-10-CM

## 2017-11-05 DIAGNOSIS — F515 Nightmare disorder: Secondary | ICD-10-CM

## 2017-11-05 DIAGNOSIS — Z794 Long term (current) use of insulin: Secondary | ICD-10-CM | POA: Diagnosis not present

## 2017-11-05 DIAGNOSIS — Z6832 Body mass index (BMI) 32.0-32.9, adult: Secondary | ICD-10-CM

## 2017-11-05 DIAGNOSIS — M859 Disorder of bone density and structure, unspecified: Secondary | ICD-10-CM | POA: Diagnosis not present

## 2017-11-05 DIAGNOSIS — Z780 Asymptomatic menopausal state: Secondary | ICD-10-CM

## 2017-11-05 DIAGNOSIS — N83209 Unspecified ovarian cyst, unspecified side: Secondary | ICD-10-CM

## 2017-11-05 LAB — GLUCOSE, CAPILLARY: Glucose-Capillary: 149 mg/dL — ABNORMAL HIGH (ref 70–99)

## 2017-11-05 NOTE — Assessment & Plan Note (Signed)
Noted on DEXA in 2018. She is not taking calcium or vitamin D.  I will check a vitamin D level today. In the meantime, I have advised her to take 400-800 IU of vitamin D daily.  She has a normal calcium on last BMET.

## 2017-11-05 NOTE — Assessment & Plan Note (Signed)
She is due for follow up with gynecology which she has scheduled for early October.  Very mild pain occasionally, but no acute issues.

## 2017-11-05 NOTE — Assessment & Plan Note (Signed)
Due for flu shot, which will be given today.   Last MMG in 2017, I advised her to schedule follow up   DEXA with osteopenia last year.

## 2017-11-05 NOTE — Assessment & Plan Note (Signed)
She is following with Urology and has imaging studies planned. She will follow up in October.  She is working on timed toileting and wearing pads/depends.

## 2017-11-05 NOTE — Patient Instructions (Addendum)
Alyssa Haynes - -  Congratulations on the weight loss!  Hopefully with continued use of Invokana, this will continue.    Please continue all of your medications as prescribed.  Please take the melatonin earlier in the evening to see if this helps you.  Please try to use a 3mg  dosage or less.   Please come back to see me in 2-3 months or sooner if you are having any issues.   Melatonin oral solid dosage forms What is this medicine? MELATONIN (mel uh TOH nin) is a dietary supplement. It is mostly promoted to help maintain normal sleep patterns. The FDA has not approved this supplement for any medical use. This supplement may be used for other purposes; ask your health care provider or pharmacist if you have questions. This medicine may be used for other purposes; ask your health care provider or pharmacist if you have questions. COMMON BRAND NAME(S): Melatonex What should I tell my health care provider before I take this medicine? They need to know if you have any of these conditions: -cancer -depression or mental illness -diabetes -hormone problems -if you often drink alcohol -immune system problems -liver disease -lung or breathing disease, like asthma -organ transplant -seizure disorder -an unusual or allergic reaction to melatonin, other medicines, foods, dyes, or preservatives -pregnant or trying to get pregnant -breast-feeding  How should I use this medicine? Take this supplement by mouth with a glass of water. Do not take with food. This supplement is usually taken 1 or 2 hours before bedtime. After taking this supplement, limit your activities to those needed to prepare for bed. Some products may be chewed or dissolved in the mouth before swallowing. Some tablets or capsules must be swallowed whole; do not cut, crush or chew. Follow the directions on the package labeling, or take as directed by your health care professional. Do not take this supplement more often than  directed. Talk to your pediatrician regarding the use of this supplement in children. Special care may be needed. This supplement is not recommended for use in children without a prescription. Overdosage: If you think you have taken too much of this medicine contact a poison control center or emergency room at once. NOTE: This medicine is only for you. Do not share this medicine with others. What if I miss a dose? If you miss taking your dose at the usual time, skip that dose. If it is almost time for your next dose, take only that dose. Do not take double or extra doses. What may interact with this medicine? Do not take this medicine with any of the following medications: -fluvoxamine -ramelteon -tasimelteon This medicine may also interact with the following medications: -alcohol -caffeine -carbamazepine -certain antibiotics like ciprofloxacin -certain medicines for depression, anxiety, or psychotic disturbances -cimetidine -female hormones, like estrogens and birth control pills, patches, rings, or injections -methoxsalen -nifedipine -other medications for sleep -other herbal or dietary supplements -phenobarbital -rifampin -smoking tobacco -tamoxifen -warfarin This list may not describe all possible interactions. Give your health care provider a list of all the medicines, herbs, non-prescription drugs, or dietary supplements you use. Also tell them if you smoke, drink alcohol, or use illegal drugs. Some items may interact with your medicine. What should I watch for while using this medicine? See your doctor if your symptoms do not get better or if they get worse. Do not take this supplement for more than 2 weeks unless your doctor tells you to. You may get drowsy or dizzy. Do  not drive, use machinery, or do anything that needs mental alertness until you know how this medicine affects you. Do not stand or sit up quickly, especially if you are an older patient. This reduces the risk of  dizzy or fainting spells. Alcohol may interfere with the effect of this medicine. Avoid alcoholic drinks. Talk to your doctor before you use this supplement if you are currently being treated for an emotional, mental, or sleep problem. This medicine may interfere with your treatment. Herbal or dietary supplements are not regulated like medicines. Rigid quality control standards are not required for dietary supplements. The purity and strength of these products can vary. The safety and effect of this dietary supplement for a certain disease or illness is not well known. This product is not intended to diagnose, treat, cure or prevent any disease. The Food and Drug Administration suggests the following to help consumers protect themselves: -Always read product labels and follow directions. -Natural does not mean a product is safe for humans to take. -Look for products that include USP after the ingredient name. This means that the manufacturer followed the standards of the [place] Pharmacopoeia. -Supplements made or sold by a nationally known food or drug company are more likely to be made under tight controls. You can write to the company for more information about how the product was made. What side effects may I notice from receiving this medicine? Side effects that you should report to your doctor or health care professional as soon as possible: -allergic reactions like skin rash, itching or hives, swelling of the face, lips, or tongue -breathing problems -confusion -depressed mood, irritable, or other changes in moods or behaviors -feeling faint or lightheaded, falls -increased blood pressure -irregular or missed menstrual periods -signs and symptoms of liver injury like dark yellow or brown urine; general ill feeling or flu-like symptoms; light-colored stools; loss of appetite; nausea; right upper belly pain; unusually weak or tired; yellowing of the eyes or skin -trouble staying awake or alert  during the day -unusual activities while you are still asleep like driving, eating, making phone calls -unusual bleeding or bruising Side effects that usually do not require medical attention (report to your doctor or health care professional if they continue or are bothersome): -dizziness -drowsiness -headache -hot flashes -nausea -tiredness -unusual dreams or nightmares -upset stomach This list may not describe all possible side effects. Call your doctor for medical advice about side effects. You may report side effects to FDA at 1-800-FDA-1088. Where should I keep my medicine? Keep out of the reach of children. Store at room temperature or as directed on the package label. Protect from moisture. Throw away any unused supplement after the expiration date. NOTE: This sheet is a summary. It may not cover all possible information. If you have questions about this medicine, talk to your doctor, pharmacist, or health care provider.  2018 Elsevier/Gold Standard (2015-10-30 14:38:22)

## 2017-11-05 NOTE — Assessment & Plan Note (Signed)
She is doing well today. She has had a weight loss which she was very happy with.  No issues with Invokana or metformin dosing or side effects.   Plan MAU/Cr today to assess for albuminuria Follow up in 2-3 months for repeat A1C She will need tighter control given retinopathy on exam

## 2017-11-05 NOTE — Assessment & Plan Note (Signed)
Improved.  She is seeing a counselor with good results.

## 2017-11-05 NOTE — Assessment & Plan Note (Signed)
Last LDL > 1 year ago was controlled.  She is taking atorvastatin.   Plan Check Lipid panel Continue atorvastatin

## 2017-11-05 NOTE — Assessment & Plan Note (Signed)
Wt is down to 208 today, which gives her a BMI of 32.  I congratulated her on her success.  She is interested in getting back into working out to hopefully improve her energy levels.  We discussed ways to modify work outs and add them to her day.
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
## 2017-11-06 LAB — MICROALBUMIN / CREATININE URINE RATIO
Creatinine, Urine: 111.7 mg/dL
Microalb/Creat Ratio: 13.9 mg/g creat (ref 0.0–30.0)
Microalbumin, Urine: 15.5 ug/mL

## 2017-11-06 LAB — LIPID PANEL
Chol/HDL Ratio: 2.2 ratio (ref 0.0–4.4)
Cholesterol, Total: 114 mg/dL (ref 100–199)
HDL: 51 mg/dL (ref >39)
LDL Calculated: 48 mg/dL (ref 0–99)
Triglycerides: 74 mg/dL (ref 0–149)
VLDL Cholesterol Cal: 15 mg/dL (ref 5–40)

## 2017-11-06 LAB — VITAMIN D 25 HYDROXY (VIT D DEFICIENCY, FRACTURES): Vit D, 25-Hydroxy: 26.2 ng/mL — ABNORMAL LOW (ref 30.0–100.0)

## 2017-11-12 ENCOUNTER — Encounter: Payer: Self-pay | Admitting: Internal Medicine

## 2017-11-12 ENCOUNTER — Telehealth: Payer: Self-pay | Admitting: *Deleted

## 2017-11-12 NOTE — Telephone Encounter (Signed)
Pt calls and states she got a mychart message saying her labwork was ready for her to review but she cant get the values, would you like for triage to give her a message or would you like to call her at [phone]

## 2017-11-12 NOTE — Telephone Encounter (Signed)
No answer, no vmail 

## 2017-11-12 NOTE — Telephone Encounter (Signed)
Her testing is released to [facility].  I am not sure why she cannot see it.  I sent her a Pharmacist, community message.  Can you call and tell her?  Thanks.

## 2017-11-17 NOTE — Telephone Encounter (Signed)
Called pt - stated she's having trouble going into MyChart. Informed as per Dr Trish Fountain message "Your blood work and urine test looked good. You have a very mildly low vitamin D. If you are not already taking a supplement, you can take 800 units of vitamin D daily over the counter to help with this." Stated he husband gets 1000 units of vit d from the [place] and they have extra bottles; wants to know if it's ok to take 1000 units vs 800?

## 2017-11-18 NOTE — Telephone Encounter (Signed)
That is fine 

## 2017-11-18 NOTE — Telephone Encounter (Signed)
Called pt - no answer; left message to call the office . 

## 2017-11-18 NOTE — Telephone Encounter (Signed)
Pt is returning your phone call  

## 2017-11-19 NOTE — Telephone Encounter (Signed)
Talked to pt - informed ok to take 1000 units of Vit D per Dr Daryll Drown.

## 2017-12-05 DIAGNOSIS — R350 Frequency of micturition: Secondary | ICD-10-CM | POA: Diagnosis not present

## 2017-12-09 ENCOUNTER — Ambulatory Visit
Admission: RE | Admit: 2017-12-09 | Discharge: 2017-12-09 | Disposition: A | Discharge status: Home / Self Care | Payer: Medicare Other | Source: Ambulatory Visit | Attending: Internal Medicine | Admitting: Internal Medicine

## 2017-12-09 DIAGNOSIS — Z780 Asymptomatic menopausal state: Secondary | ICD-10-CM

## 2017-12-09 DIAGNOSIS — Z1231 Encounter for screening mammogram for malignant neoplasm of breast: Secondary | ICD-10-CM | POA: Diagnosis not present

## 2017-12-18 ENCOUNTER — Other Ambulatory Visit: Payer: Self-pay | Admitting: Internal Medicine

## 2017-12-18 DIAGNOSIS — I10 Essential (primary) hypertension: Secondary | ICD-10-CM

## 2017-12-19 DIAGNOSIS — H04223 Epiphora due to insufficient drainage, bilateral lacrimal glands: Secondary | ICD-10-CM | POA: Diagnosis not present

## 2017-12-19 DIAGNOSIS — H40013 Open angle with borderline findings, low risk, bilateral: Secondary | ICD-10-CM | POA: Diagnosis not present

## 2017-12-19 DIAGNOSIS — H04123 Dry eye syndrome of bilateral lacrimal glands: Secondary | ICD-10-CM | POA: Diagnosis not present

## 2018-01-01 DIAGNOSIS — M7062 Trochanteric bursitis, left hip: Secondary | ICD-10-CM | POA: Diagnosis not present

## 2018-01-01 DIAGNOSIS — M25552 Pain in left hip: Secondary | ICD-10-CM | POA: Diagnosis not present

## 2018-01-23 ENCOUNTER — Other Ambulatory Visit: Payer: Self-pay | Admitting: Internal Medicine

## 2018-01-23 DIAGNOSIS — E1169 Type 2 diabetes mellitus with other specified complication: Secondary | ICD-10-CM

## 2018-01-23 DIAGNOSIS — E785 Hyperlipidemia, unspecified: Principal | ICD-10-CM

## 2018-02-20 ENCOUNTER — Other Ambulatory Visit: Payer: Self-pay | Admitting: Internal Medicine

## 2018-02-20 DIAGNOSIS — E119 Type 2 diabetes mellitus without complications: Secondary | ICD-10-CM

## 2018-02-20 DIAGNOSIS — Z794 Long term (current) use of insulin: Principal | ICD-10-CM

## 2018-02-20 NOTE — Telephone Encounter (Signed)
Next appt scheduled 03/18/18 with PCP. 

## 2018-02-26 ENCOUNTER — Encounter: Payer: Self-pay | Admitting: Internal Medicine

## 2018-02-26 ENCOUNTER — Ambulatory Visit (INDEPENDENT_AMBULATORY_CARE_PROVIDER_SITE_OTHER): Payer: Medicare Other | Admitting: Internal Medicine

## 2018-02-26 ENCOUNTER — Telehealth: Payer: Self-pay | Admitting: Dietician

## 2018-02-26 VITALS — BP 143/64 | HR 79 | Temp 98.4°F | Wt 194.9 lb

## 2018-02-26 DIAGNOSIS — J22 Unspecified acute lower respiratory infection: Secondary | ICD-10-CM

## 2018-02-26 DIAGNOSIS — Z8709 Personal history of other diseases of the respiratory system: Secondary | ICD-10-CM | POA: Diagnosis not present

## 2018-02-26 DIAGNOSIS — J069 Acute upper respiratory infection, unspecified: Secondary | ICD-10-CM | POA: Insufficient documentation

## 2018-02-26 NOTE — Assessment & Plan Note (Signed)
Hx of asthma: No exacerbations since prior to 2013 as per patient. Denied being treated for this in years. Denied wheezing, sputum production, dyspnea or chest discomfort. Was previously treated with Advair and PRN albuterol. Given her history and the potential concern as to how this would alter the treatment of pulmonary disease in this patient, we recommended pulmonary function testing to evaluate for reversible airway disease.    Plan:  PFT's ordered

## 2018-02-26 NOTE — Assessment & Plan Note (Signed)
Lower respiratory tract infection: Patient presents with a 5 day history of cough and mild chest tightness. She initially developed sinus congestion and a cough with the sinus symptoms resolving following treatment with sinex. She denied fever, chills, chest pain, wheezing, crackles, sputum expectoration, throat pain, swollen lymph nodes, ear pain, headache, visual changes, back pain, abdominal pain, diarrhea, or urinary symptoms. Given her rather unremarkable exam and HPI we will monitor her and treat symptomatically.   Plan: Recommended that she continue her tylenol as needed, get plenty of rest, and observe for more concerning symptoms. She was provided with strict return precautions.

## 2018-02-26 NOTE — Patient Instructions (Signed)
FOLLOW-UP INSTRUCTIONS When: If your symptoms worsen or fail to improve What to bring: All of your medications  I have not made any changes to your medications today.   Today we discussed your sinus congestion that has improved, your cough and chest tightness. Given your current symptoms and the exam today I feel that you are most likely experiencing common cold. These may take several weeks to recover from even after your body has cleared the mild infection.   Things to watch out for include, headache, visual changes, persistent fever, chills, rapid weight loss, symptoms that improve and then worsen again. Please notify us of any of those findings or if they are severe and we are unavailable, please proceed to the ER.  I would recommend mucinex (guafenesin) over the counter for the mucus. As far as your other symptoms go, they appear to be improving. Continue your current efforts and let us know if anything changes.   Thank you for your visit to the [facility] today. If you have any questions or concerns please call us at [phone].

## 2018-02-26 NOTE — Progress Notes (Signed)
   CC: cough  HPI:[name] is a 73 y.o. female who presents for evaluation of cough. Please see individual problem based A/P for details.  Past Medical History:  Diagnosis Date  . Anxiety attack 08/22/2017  . Asthma   . Diabetes mellitus   . GERD (gastroesophageal reflux disease)   . Hyperlipidemia   . Hypertension    Review of Systems:  ROS negative except as per HPI.  Physical Exam: Vitals:   02/26/18 1325  BP: (!) 143/64  Pulse: 79  Temp: 98.4 F (36.9 C)  TempSrc: Oral  SpO2: 99%  Weight: 194 lb 14.4 oz (88.4 kg)   General: A/O x4, in no acute distress, afebrile, nondiaphoretic HEENT: Absent nasopharyngeal erythema or plaques, nasal passages clear. Cardio: RRR, no mrg's Pulmonary: CTA bilaterally, no wheezing or crackles upper or lower fiels  Assessment & Plan:   See Encounters Tab for problem based charting.  Patient discussed with Dr. Evette Doffing

## 2018-02-26 NOTE — Telephone Encounter (Signed)
Opened in error

## 2018-02-27 NOTE — Progress Notes (Signed)
Internal Medicine Clinic Attending  Case discussed with Dr. Harbrecht at the time of the visit.  We reviewed the resident's history and exam and pertinent patient test results.  I agree with the assessment, diagnosis, and plan of care documented in the resident's note.   

## 2018-03-02 ENCOUNTER — Ambulatory Visit: Payer: Medicare Other

## 2018-03-04 ENCOUNTER — Ambulatory Visit (INDEPENDENT_AMBULATORY_CARE_PROVIDER_SITE_OTHER): Payer: Medicare Other | Admitting: Internal Medicine

## 2018-03-04 ENCOUNTER — Other Ambulatory Visit: Payer: Self-pay

## 2018-03-04 ENCOUNTER — Encounter: Payer: Self-pay | Admitting: Internal Medicine

## 2018-03-04 VITALS — BP 134/74 | HR 82 | Temp 98.2°F | Ht 67.0 in | Wt 194.1 lb

## 2018-03-04 DIAGNOSIS — I10 Essential (primary) hypertension: Secondary | ICD-10-CM

## 2018-03-04 DIAGNOSIS — E113299 Type 2 diabetes mellitus with mild nonproliferative diabetic retinopathy without macular edema, unspecified eye: Secondary | ICD-10-CM | POA: Diagnosis not present

## 2018-03-04 DIAGNOSIS — M8580 Other specified disorders of bone density and structure, unspecified site: Secondary | ICD-10-CM

## 2018-03-04 DIAGNOSIS — N3946 Mixed incontinence: Secondary | ICD-10-CM | POA: Diagnosis not present

## 2018-03-04 DIAGNOSIS — Z794 Long term (current) use of insulin: Secondary | ICD-10-CM

## 2018-03-04 DIAGNOSIS — E1169 Type 2 diabetes mellitus with other specified complication: Secondary | ICD-10-CM | POA: Diagnosis not present

## 2018-03-04 DIAGNOSIS — J069 Acute upper respiratory infection, unspecified: Secondary | ICD-10-CM

## 2018-03-04 DIAGNOSIS — N83209 Unspecified ovarian cyst, unspecified side: Secondary | ICD-10-CM

## 2018-03-04 DIAGNOSIS — E785 Hyperlipidemia, unspecified: Secondary | ICD-10-CM

## 2018-03-04 DIAGNOSIS — N83202 Unspecified ovarian cyst, left side: Secondary | ICD-10-CM

## 2018-03-04 DIAGNOSIS — Z79899 Other long term (current) drug therapy: Secondary | ICD-10-CM

## 2018-03-04 DIAGNOSIS — Z7984 Long term (current) use of oral hypoglycemic drugs: Secondary | ICD-10-CM

## 2018-03-04 DIAGNOSIS — E559 Vitamin D deficiency, unspecified: Secondary | ICD-10-CM

## 2018-03-04 LAB — GLUCOSE, CAPILLARY: Glucose-Capillary: 145 mg/dL — ABNORMAL HIGH (ref 70–99)

## 2018-03-04 LAB — POCT GLYCOSYLATED HEMOGLOBIN (HGB A1C): Hemoglobin A1C: 7.9 % — AB (ref 4.0–5.6)

## 2018-03-04 MED ORDER — GUAIFENESIN ER 600 MG PO TB12: 1200.0000 mg | ORAL_TABLET | Freq: Two times a day (BID) | ORAL | 0 refills | Status: DC

## 2018-03-04 NOTE — Assessment & Plan Note (Signed)
She is following with OB/Gyn for this issue.  She had a repeat US in October which showed it was stable to slightly larger.  OB/Gyn will continue to follow and manage as needed.

## 2018-03-04 NOTE — Assessment & Plan Note (Signed)
Her FRAX score is Major 5.4%, hip 1.1%.  Does not reach cut off to start bisphosphonate.  She has mild vitamin d deficiency.  She has not started vitamin D supplementation and I advised her to start this today.   Plan Start vitamin D replacement 400 - 800 IU daily

## 2018-03-04 NOTE — Assessment & Plan Note (Signed)
She is following with urology and she feels that this does not affect her life significantly at this time.

## 2018-03-04 NOTE — Patient Instructions (Addendum)
Alyssa Haynes - -  Thank you for coming in to see me today.   For your cough and congestion - please continue tessalon perles and albuterol inhaler as needed.   I have sent in a prescription for Guaifenesin (Mucinex) to see if your insurance will cover it, otherwise, ask the pharmacist where you can find the Mucinex extra strength.    If your cough is no better in 3 more weeks, come back to the clinic and we will further discuss.   Otherwise, continue your previous medications as prescribed, and come back to see me in 3 months.

## 2018-03-04 NOTE — Progress Notes (Signed)
   Subjective:    Patient ID: Alyssa Haynes, female    DOB: Sep 09, 1945, 73 y.o.   MRN: [id number]  CC: 4 month follow up for uncontrolled DM2  HPI  [name] is a 73 year old woman with PMH Of DM2, HLD, osteopenia, HTN who presents for follow up of chronic issues. She was seen in the [facility] last week for a URI and was treated symptomatically.  Today, she reports that her cough and chest congestion persist.  They are a little better.  Symptoms mainly at night when she lays down, she will have congestion and a coughing fit.  She is able to get the coughing to stop with tessalon perles and the congestion is helped with her albuterol inhaler.  She has no fever, no hemoptysis, no chest pain, no sore throat.  She is overall getting better, but the cough is concerning to her.    She notes that she has been exercising and has lost weight.  Her A1C is the same as last visit.  She is taking her invokana and metformin as prescribed.  Her BP is good today and her LDL checked at last visit was 48.  We discussed the pneumonia 13 vaccine today and she would like to defer until she is well.   Review of Systems  Constitutional: Negative for activity change, appetite change and fatigue.  HENT: Positive for congestion and postnasal drip. Negative for rhinorrhea, sore throat and voice change.   Respiratory: Positive for cough and wheezing. Negative for choking and shortness of breath.   Cardiovascular: Negative for chest pain and leg swelling.  Genitourinary: Positive for urgency. Negative for difficulty urinating, dysuria and frequency.  Neurological: Negative for weakness.       Objective:   Physical Exam Vitals signs and nursing note reviewed.  Constitutional:      General: She is not in acute distress.    Appearance: Normal appearance. She is not ill-appearing, toxic-appearing or diaphoretic.  HENT:     Head: Normocephalic and atraumatic.     Right Ear: External ear normal. There is impacted cerumen.      Left Ear: Tympanic membrane, ear canal and external ear normal. There is no impacted cerumen.  Eyes:     General: No scleral icterus.       Right eye: No discharge.        Left eye: No discharge.  Cardiovascular:     Rate and Rhythm: Normal rate and regular rhythm.     Pulses: Normal pulses.     Heart sounds: Normal heart sounds. No murmur.  Pulmonary:     Effort: Pulmonary effort is normal. No respiratory distress.     Breath sounds: Normal breath sounds. No stridor. No wheezing.  Musculoskeletal:        General: No swelling or tenderness.  Skin:    General: Skin is warm and dry.  Neurological:     Mental Status: She is alert.  Psychiatric:        Mood and Affect: Mood normal.     CMET and CBC today.   A1C is 7.9      Assessment & Plan:  RTC in 3 months, sooner if needed.   Impacted cerumen - Ears cleaned with water pick today.  - She has debrox at home

## 2018-03-04 NOTE — Assessment & Plan Note (Signed)
She is doing better, but with persistent cough.  Exam is not concerning for bacterial infection.  We talked about symptomatic treatment and she will try guaifenesin over the counter.  If she is not improving in the next 3 weeks, she will come back to see me and we will discuss options for chronic cough treatment.  She has a history of reflux treated with tums and asthma, which might be the culprits if her cough does not continue to improve.

## 2018-03-04 NOTE — Assessment & Plan Note (Signed)
She is doing well.  Losing weight.  Exercising.  Her A1C is persistently 7.9.  I think she could do well with an A1C goal which was higher given age.  She is also not interested in adding new medications today.  She is taking invokana and metformin with good results.   A1C 7.9 BP 134/74 LDL 48 Foot exam - done today, no deficits, good pulses Eye Exam - done last year Renal - check renal function today. MAU/Cr at last visit < 30  Plan Continue invokana and metformin at current doses Encouraged continued weight loss (BMI is now 30) Follow up in 3 months.

## 2018-03-04 NOTE — Assessment & Plan Note (Signed)
BP is well controlled today.  She is taking olmesartan without issue.  She has no complaints that would be concerning for dehydration, chest pain.   Plan Continue olmesartan, check BMET for renal function and K today.

## 2018-03-04 NOTE — Assessment & Plan Note (Signed)
LDL at last check was 48, she is on atorvastatin with good results.    Plan Continue atorvastatin

## 2018-03-05 LAB — CMP14 + ANION GAP
ALT: 131 IU/L — ABNORMAL HIGH (ref 0–32)
AST: 163 IU/L — ABNORMAL HIGH (ref 0–40)
Albumin/Globulin Ratio: 1.6 (ref 1.2–2.2)
Albumin: 4.5 g/dL (ref 3.5–4.8)
Alkaline Phosphatase: 247 IU/L — ABNORMAL HIGH (ref 39–117)
Anion Gap: 19 mmol/L — ABNORMAL HIGH (ref 10.0–18.0)
BUN/Creatinine Ratio: 12 (ref 12–28)
BUN: 10 mg/dL (ref 8–27)
Bilirubin Total: 0.5 mg/dL (ref 0.0–1.2)
CO2: 19 mmol/L — ABNORMAL LOW (ref 20–29)
Calcium: 9.5 mg/dL (ref 8.7–10.3)
Chloride: 105 mmol/L (ref 96–106)
Creatinine, Ser: 0.81 mg/dL (ref 0.57–1.00)
GFR calc Af Amer: 85 mL/min/{1.73_m2} (ref >59)
GFR calc non Af Amer: 74 mL/min/{1.73_m2} (ref >59)
Globulin, Total: 2.8 g/dL (ref 1.5–4.5)
Glucose: 138 mg/dL — ABNORMAL HIGH (ref 65–99)
Potassium: 4 mmol/L (ref 3.5–5.2)
Sodium: 143 mmol/L (ref 134–144)
Total Protein: 7.3 g/dL (ref 6.0–8.5)

## 2018-03-05 LAB — CBC
Hematocrit: 38.7 % (ref 34.0–46.6)
Hemoglobin: 12.6 g/dL (ref 11.1–15.9)
MCH: 29.1 pg (ref 26.6–33.0)
MCHC: 32.6 g/dL (ref 31.5–35.7)
MCV: 89 fL (ref 79–97)
Platelets: 296 10*3/uL (ref 150–450)
RBC: 4.33 x10E6/uL (ref 3.77–5.28)
RDW: 13 % (ref 11.7–15.4)
WBC: 6.2 10*3/uL (ref 3.4–10.8)

## 2018-03-14 ENCOUNTER — Telehealth: Payer: Self-pay | Admitting: Internal Medicine

## 2018-03-14 DIAGNOSIS — R740 Nonspecific elevation of levels of transaminase and lactic acid dehydrogenase [LDH]: Principal | ICD-10-CM

## 2018-03-14 DIAGNOSIS — R7401 Elevation of levels of liver transaminase levels: Secondary | ICD-10-CM

## 2018-03-14 NOTE — Telephone Encounter (Signed)
Called and spoke with Ms. Bouwens Tashawn regarding labwork.  Confirmed identity.   On last blood work, Ms.

## 2018-03-14 NOTE — Telephone Encounter (Signed)
Called and spoke with [name] re: recent blood work.   It was noted that her LFTs are about 4X the ULN also with an elevated ALP.  She is asymptomatic.  She has no history of ETOH use or hepatitis.  She has no family history of liver failure.  She does have DM and HTN.  DM has been less controlled lately.  Most common causes would include NAFLD, asymptomatic hepatitis, iron overload (hemochromotosis).    Will order ultrasound of the liver, hepatitis antibodies and iron/TIBC for which she is agreeable.  I will contact our lab staff to let them know to expect her to come in this week.   Glenda - - Can you help organize her ultrasound?   Thank you  Gilles Chiquito, MD

## 2018-03-14 NOTE — Addendum Note (Signed)
Addended by: Gilles Chiquito B on: 03/14/2018 07:44 PM   Modules accepted: Orders

## 2018-03-16 ENCOUNTER — Other Ambulatory Visit (INDEPENDENT_AMBULATORY_CARE_PROVIDER_SITE_OTHER): Payer: Medicare Other

## 2018-03-16 DIAGNOSIS — R740 Nonspecific elevation of levels of transaminase and lactic acid dehydrogenase [LDH]: Secondary | ICD-10-CM | POA: Diagnosis not present

## 2018-03-16 DIAGNOSIS — R7401 Elevation of levels of liver transaminase levels: Secondary | ICD-10-CM

## 2018-03-17 LAB — HEPATITIS B SURFACE ANTIBODY,QUALITATIVE: Hep B Surface Ab, Qual: NONREACTIVE

## 2018-03-17 LAB — IRON AND TIBC
Iron Saturation: 23 % (ref 15–55)
Iron: 70 ug/dL (ref 27–139)
Total Iron Binding Capacity: 307 ug/dL (ref 250–450)
UIBC: 237 ug/dL (ref 118–369)

## 2018-03-17 LAB — HEPATITIS B SURFACE ANTIGEN: Hepatitis B Surface Ag: NEGATIVE

## 2018-03-17 LAB — HEPATITIS B CORE ANTIBODY, TOTAL: Hep B Core Total Ab: NEGATIVE

## 2018-03-18 ENCOUNTER — Ambulatory Visit: Payer: Medicare Other | Admitting: Internal Medicine

## 2018-03-19 ENCOUNTER — Other Ambulatory Visit: Payer: Self-pay | Admitting: *Deleted

## 2018-03-19 ENCOUNTER — Telehealth: Payer: Self-pay

## 2018-03-19 ENCOUNTER — Telehealth: Payer: Self-pay | Admitting: Internal Medicine

## 2018-03-19 DIAGNOSIS — Z794 Long term (current) use of insulin: Principal | ICD-10-CM

## 2018-03-19 DIAGNOSIS — E119 Type 2 diabetes mellitus without complications: Secondary | ICD-10-CM

## 2018-03-19 MED ORDER — CANAGLIFLOZIN 100 MG PO TABS: 100.0000 mg | ORAL_TABLET | Freq: Every day | ORAL | 1 refills | Status: DC

## 2018-03-19 NOTE — Telephone Encounter (Signed)
Called patient to discuss results.  Confirmed identity with name and DOB.   Reviewed hepatitis labs (negative) and iron studies (normal).   Awaiting US liver for further plan.   Patient expressed understanding.   Gilles Chiquito, MD

## 2018-03-19 NOTE — Telephone Encounter (Signed)
Called back.  Please see telephone note.

## 2018-03-19 NOTE — Telephone Encounter (Signed)
Requesting lab results. Please call pt back.  

## 2018-04-01 ENCOUNTER — Encounter: Payer: Self-pay | Admitting: Internal Medicine

## 2018-04-02 ENCOUNTER — Ambulatory Visit (HOSPITAL_COMMUNITY): Payer: Medicare Other

## 2018-05-05 ENCOUNTER — Other Ambulatory Visit: Payer: Self-pay

## 2018-05-05 NOTE — Telephone Encounter (Signed)
Pt would med for anxiety, please call pt back.

## 2018-05-05 NOTE — Telephone Encounter (Signed)
Pt calls and states she is becoming short tempered and anxious. She is also having a few memory issues. She just feels not like herself. Normally would offer appt, please advise

## 2018-05-05 NOTE — Telephone Encounter (Signed)
We cannot have her in for non urgent appointment right now given coronavirus.  I will call her tomorrow and discuss and treat over phone if appropriate.

## 2018-05-06 ENCOUNTER — Telehealth: Payer: Self-pay | Admitting: Internal Medicine

## 2018-05-06 NOTE — Telephone Encounter (Signed)
Attempted to call patient on preferred number ([phone]).  Requested a call back from her tomorrow.    Gilles Chiquito, MD

## 2018-05-07 ENCOUNTER — Other Ambulatory Visit: Payer: Self-pay | Admitting: Internal Medicine

## 2018-05-07 ENCOUNTER — Telehealth: Payer: Self-pay | Admitting: Internal Medicine

## 2018-05-07 DIAGNOSIS — I10 Essential (primary) hypertension: Secondary | ICD-10-CM

## 2018-05-07 MED ORDER — BUSPIRONE HCL 5 MG PO TABS: 5.0000 mg | ORAL_TABLET | Freq: Two times a day (BID) | ORAL | 1 refills | Status: DC

## 2018-05-07 MED ORDER — DIAZEPAM 5 MG PO TABS: 2.5000 mg | ORAL_TABLET | Freq: Two times a day (BID) | ORAL | 0 refills | Status: DC | PRN
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
## 2018-05-13 ENCOUNTER — Encounter: Payer: Self-pay | Admitting: Internal Medicine

## 2018-06-03 ENCOUNTER — Ambulatory Visit (INDEPENDENT_AMBULATORY_CARE_PROVIDER_SITE_OTHER): Payer: Medicare Other | Admitting: Internal Medicine

## 2018-06-03 ENCOUNTER — Other Ambulatory Visit: Payer: Self-pay

## 2018-06-03 DIAGNOSIS — Z794 Long term (current) use of insulin: Secondary | ICD-10-CM

## 2018-06-03 DIAGNOSIS — F410 Panic disorder [episodic paroxysmal anxiety] without agoraphobia: Secondary | ICD-10-CM | POA: Diagnosis not present

## 2018-06-03 DIAGNOSIS — E113299 Type 2 diabetes mellitus with mild nonproliferative diabetic retinopathy without macular edema, unspecified eye: Secondary | ICD-10-CM | POA: Diagnosis not present

## 2018-06-03 DIAGNOSIS — M1612 Unilateral primary osteoarthritis, left hip: Secondary | ICD-10-CM

## 2018-06-03 DIAGNOSIS — E669 Obesity, unspecified: Secondary | ICD-10-CM | POA: Diagnosis not present

## 2018-06-03 NOTE — Patient Instructions (Signed)
Instructions provided over the phone.

## 2018-06-03 NOTE — Assessment & Plan Note (Addendum)
She reports working to lose weight and exercising.  I continued to encourage her.  She reported a weight of 184 pounds which would be a 10 pound weight loss compared to our last measurement.    Continue diet and exercise as she has been doing.

## 2018-06-03 NOTE — Assessment & Plan Note (Signed)
Improved.  She did not like taking buspar and is now taking PRN valium with good results.  She is enjoying staying home during the Pandemic.    Plan Continue PRN Valium for anxiety attacks.

## 2018-06-03 NOTE — Assessment & Plan Note (Signed)
Well controlled with ibuprofen only.  She will let me know if this worsens.    Plan Continue PRN ibuprofen.

## 2018-06-03 NOTE — Assessment & Plan Note (Signed)
Follow up in 1-3 months when the clinic is back to operating at normal business hours.
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
## 2018-06-10 ENCOUNTER — Encounter: Payer: Self-pay | Admitting: Internal Medicine

## 2018-06-11 ENCOUNTER — Encounter: Payer: Self-pay | Admitting: Internal Medicine

## 2018-06-30 ENCOUNTER — Other Ambulatory Visit: Payer: Self-pay | Admitting: *Deleted

## 2018-06-30 DIAGNOSIS — Z794 Long term (current) use of insulin: Secondary | ICD-10-CM

## 2018-06-30 DIAGNOSIS — E119 Type 2 diabetes mellitus without complications: Secondary | ICD-10-CM

## 2018-06-30 MED ORDER — CANAGLIFLOZIN 100 MG PO TABS: 100.0000 mg | ORAL_TABLET | Freq: Every day | ORAL | 1 refills | Status: DC

## 2018-07-09 DIAGNOSIS — M25551 Pain in right hip: Secondary | ICD-10-CM | POA: Diagnosis not present

## 2018-07-20 ENCOUNTER — Encounter: Payer: Self-pay | Admitting: Internal Medicine

## 2018-07-23 ENCOUNTER — Other Ambulatory Visit: Payer: Self-pay | Admitting: Internal Medicine

## 2018-07-23 DIAGNOSIS — I10 Essential (primary) hypertension: Secondary | ICD-10-CM

## 2018-08-06 ENCOUNTER — Other Ambulatory Visit: Payer: Self-pay | Admitting: Internal Medicine

## 2018-08-07 ENCOUNTER — Other Ambulatory Visit: Payer: Self-pay | Admitting: *Deleted

## 2018-08-10 MED ORDER — ACCU-CHEK AVIVA PLUS VI STRP: ORAL_STRIP | 1 refills | Status: DC

## 2018-08-20 DIAGNOSIS — R262 Difficulty in walking, not elsewhere classified: Secondary | ICD-10-CM | POA: Diagnosis not present

## 2018-08-20 DIAGNOSIS — M7062 Trochanteric bursitis, left hip: Secondary | ICD-10-CM | POA: Diagnosis not present

## 2018-08-20 DIAGNOSIS — M7071 Other bursitis of hip, right hip: Secondary | ICD-10-CM | POA: Diagnosis not present

## 2018-08-25 DIAGNOSIS — R262 Difficulty in walking, not elsewhere classified: Secondary | ICD-10-CM | POA: Diagnosis not present

## 2018-08-25 DIAGNOSIS — M7062 Trochanteric bursitis, left hip: Secondary | ICD-10-CM | POA: Diagnosis not present

## 2018-08-25 DIAGNOSIS — M7071 Other bursitis of hip, right hip: Secondary | ICD-10-CM | POA: Diagnosis not present

## 2018-09-01 DIAGNOSIS — M7071 Other bursitis of hip, right hip: Secondary | ICD-10-CM | POA: Diagnosis not present

## 2018-09-01 DIAGNOSIS — R262 Difficulty in walking, not elsewhere classified: Secondary | ICD-10-CM | POA: Diagnosis not present

## 2018-09-01 DIAGNOSIS — M7062 Trochanteric bursitis, left hip: Secondary | ICD-10-CM | POA: Diagnosis not present

## 2018-09-03 DIAGNOSIS — M7071 Other bursitis of hip, right hip: Secondary | ICD-10-CM | POA: Diagnosis not present

## 2018-09-03 DIAGNOSIS — R262 Difficulty in walking, not elsewhere classified: Secondary | ICD-10-CM | POA: Diagnosis not present

## 2018-09-03 DIAGNOSIS — M7062 Trochanteric bursitis, left hip: Secondary | ICD-10-CM | POA: Diagnosis not present

## 2018-09-10 DIAGNOSIS — R262 Difficulty in walking, not elsewhere classified: Secondary | ICD-10-CM | POA: Diagnosis not present

## 2018-09-10 DIAGNOSIS — M7062 Trochanteric bursitis, left hip: Secondary | ICD-10-CM | POA: Diagnosis not present

## 2018-09-10 DIAGNOSIS — M7071 Other bursitis of hip, right hip: Secondary | ICD-10-CM | POA: Diagnosis not present

## 2018-09-16 DIAGNOSIS — R262 Difficulty in walking, not elsewhere classified: Secondary | ICD-10-CM | POA: Diagnosis not present

## 2018-09-16 DIAGNOSIS — M7071 Other bursitis of hip, right hip: Secondary | ICD-10-CM | POA: Diagnosis not present

## 2018-09-16 DIAGNOSIS — M7062 Trochanteric bursitis, left hip: Secondary | ICD-10-CM | POA: Diagnosis not present

## 2018-09-17 ENCOUNTER — Encounter: Payer: Self-pay | Admitting: Internal Medicine

## 2018-09-17 ENCOUNTER — Other Ambulatory Visit: Payer: Self-pay

## 2018-09-17 DIAGNOSIS — R6889 Other general symptoms and signs: Secondary | ICD-10-CM | POA: Diagnosis not present

## 2018-09-17 DIAGNOSIS — Z20822 Contact with and (suspected) exposure to covid-19: Secondary | ICD-10-CM

## 2018-09-19 LAB — NOVEL CORONAVIRUS, NAA: SARS-CoV-2, NAA: NOT DETECTED

## 2018-09-23 ENCOUNTER — Encounter: Payer: Self-pay | Admitting: Internal Medicine

## 2018-09-24 ENCOUNTER — Ambulatory Visit (INDEPENDENT_AMBULATORY_CARE_PROVIDER_SITE_OTHER): Payer: Medicare Other | Admitting: Internal Medicine

## 2018-09-24 ENCOUNTER — Encounter: Payer: Self-pay | Admitting: Internal Medicine

## 2018-09-24 ENCOUNTER — Other Ambulatory Visit: Payer: Self-pay

## 2018-09-24 DIAGNOSIS — R12 Heartburn: Secondary | ICD-10-CM

## 2018-09-24 DIAGNOSIS — J069 Acute upper respiratory infection, unspecified: Secondary | ICD-10-CM | POA: Diagnosis not present

## 2018-09-24 MED ORDER — OMEPRAZOLE 40 MG PO CPDR: 40.0000 mg | DELAYED_RELEASE_CAPSULE | Freq: Every day | ORAL | 3 refills | Status: DC

## 2018-09-24 MED ORDER — LORATADINE 10 MG PO TABS: 10.0000 mg | ORAL_TABLET | Freq: Every day | ORAL | 0 refills | Status: DC

## 2018-09-24 NOTE — Assessment & Plan Note (Addendum)
Alyssa Haynes states that for about once or twice a year she would have episodes of "bronchitis" which usually presents with cough.  In January of this year, she presented to the clinic with cough and mild chest tightness for which she was advised to try symptomatic treatment as her presenting symptoms did not look infectious in nature.  She has had a symptoms free.  However she reports that her cough has not completely resolved.  Over the past week, she has had worsening cough with associated nasal congestion, "sinus problems "and occasional wheezes at night when she lies on her left side.  She denies fevers, nausea, vomiting, postnasal drip, lacrimation, rhinorrhea.  She also endorses bile taste in her mouth and heartburn over the last week which has not resolved with the Alka-Seltzer plus she has been taking.  Differential diagnosis for her chronic cough includes upper airway cough syndrome, reactive airway disease (asthma), esophageal causes (GERD) and use of ACE inhibitor/ARB.  She does seem to fit in most of these categories.  I will start her on a PPI and an antihistamine for the next 3 to 4 weeks and evaluate if symptoms improved.  Plan: -Trial of omeprazole -Trial of Claritin

## 2018-09-24 NOTE — Progress Notes (Signed)
  [facility] Internal Medicine Residency Telephone Encounter Continuity Care Appointment  HPI:   This telephone encounter was created for [name] on 09/24/2018 for the following purpose/cc: Following up on cough and nasal congestion  Please see problem based charting for further details.    Past Medical History:  Past Medical History:  Diagnosis Date  . Anxiety attack 08/22/2017  . Asthma   . Diabetes mellitus   . GERD (gastroesophageal reflux disease)   . Hyperlipidemia   . Hypertension       ROS:  Review of Systems  Constitutional: Negative for chills, fever and malaise/fatigue.  HENT: Positive for congestion. Negative for sore throat.   Respiratory: Positive for cough (white sputum (intermittent)).   Cardiovascular: Negative for chest pain.  Gastrointestinal: Positive for heartburn. Negative for diarrhea, nausea and vomiting.  Skin: Negative for itching and rash.     Assessment / Plan / Recommendations:   Please see A&P under problem oriented charting for assessment of the patient's acute and chronic medical conditions.   As always, pt is advised that if symptoms worsen or new symptoms arise, they should go to an urgent care facility or to to ER for further evaluation.   Consent and Medical Decision Making:   Patient discussed with Dr. Evette Doffing  This is a telephone encounter between Alyssa Haynes and [place] on 09/24/2018 for evaluation of cough. The visit was conducted with the patient located at home and Alyssa Haynes at [facility]. The patient's identity was confirmed using their DOB and current address. The patient has consented to being evaluated through a telephone encounter and understands the associated risks (an examination cannot be done and the patient may need to come in for an appointment) / benefits (allows the patient to remain at home, decreasing exposure to coronavirus). I personally spent 10 minutes on medical discussion.

## 2018-09-25 NOTE — Addendum Note (Signed)
Addended by: Lalla Brothers T on: 09/25/2018 09:48 AM   Modules accepted: Level of Service

## 2018-09-25 NOTE — Progress Notes (Signed)
Internal Medicine Clinic Attending  Case discussed with Dr. Agyei at the time of the visit.  We reviewed the resident's history and exam and pertinent patient test results.  I agree with the assessment, diagnosis, and plan of care documented in the resident's note.    

## 2018-10-01 ENCOUNTER — Encounter: Payer: Self-pay | Admitting: Internal Medicine

## 2018-10-02 ENCOUNTER — Encounter: Payer: Self-pay | Admitting: Internal Medicine

## 2018-10-05 ENCOUNTER — Other Ambulatory Visit: Payer: Self-pay

## 2018-10-05 ENCOUNTER — Ambulatory Visit (INDEPENDENT_AMBULATORY_CARE_PROVIDER_SITE_OTHER): Payer: Medicare Other | Admitting: Internal Medicine

## 2018-10-05 ENCOUNTER — Encounter: Payer: Self-pay | Admitting: Internal Medicine

## 2018-10-05 DIAGNOSIS — Z79899 Other long term (current) drug therapy: Secondary | ICD-10-CM | POA: Diagnosis not present

## 2018-10-05 DIAGNOSIS — J45909 Unspecified asthma, uncomplicated: Secondary | ICD-10-CM

## 2018-10-05 DIAGNOSIS — K219 Gastro-esophageal reflux disease without esophagitis: Secondary | ICD-10-CM | POA: Diagnosis not present

## 2018-10-05 NOTE — Patient Instructions (Signed)
Alyssa Haynes,   It was a pleasure seeing you here at the clinic today.  As we discussed, I believe the chronic lingering cough could be a sequelae of the acid reflux.  I am glad to hear that the reflux symptoms and the bile taste has gone away.  Here my recommendations after our visit today:  1.  I would continue taking the omeprazole as prescribed.  If symptoms does not get better in the next 3 to 4 weeks, we can discuss going up on the dose of the medication. 2.  You can use an extra pillow at night to sleep to keep your head elevated.  Take Care! Dr. Eileen Stanford  Please call the internal medicine center clinic if you have any questions or concerns, we may be able to help and keep you from a long and expensive emergency room wait. Our clinic and after hours phone number is [phone], the best time to call is Monday through Friday 9 am to 4 pm but there is always someone available 24/7 if you have an emergency. If you need medication refills please notify your pharmacy one week in advance and they will send [place] a request.

## 2018-10-05 NOTE — Progress Notes (Signed)
   CC: Follow-up dry cough  HPI:  [name] is a 73 y.o. very pleasant African-American woman with medical history of intrinsic asthma and suspected GERD presenting for follow-up on dry cough.  Please see problem based charting for further details.  Past Medical History:  Diagnosis Date  . Anxiety attack 08/22/2017  . Asthma   . Diabetes mellitus   . GERD (gastroesophageal reflux disease)   . Hyperlipidemia   . Hypertension    Review of Systems: As per HPI  Physical Exam:  Vitals:   10/05/18 0836 10/05/18 0844  BP: (!) 147/79 (!) 149/80  Pulse: 82 74  Temp:  98.4 F (36.9 C)  TempSrc:  Oral  SpO2: 98%   Weight: 181 lb 4.8 oz (82.2 kg)   Height: 5\' 7"  (1.702 m)    Physical Exam  Constitutional: She is well-developed, well-nourished, and in no distress.  HENT:  Mouth/Throat: Oropharynx is clear and moist.  Eyes: Conjunctivae are normal.  Cardiovascular: Normal rate, regular rhythm and normal heart sounds. Exam reveals no friction rub.  No murmur heard. Pulmonary/Chest: Effort normal and breath sounds normal. No respiratory distress. She has no wheezes. She has no rales.  Vitals reviewed.   Assessment & Plan:   See Encounters Tab for problem based charting.  Patient discussed with Dr. Dareen Piano

## 2018-10-05 NOTE — Assessment & Plan Note (Signed)
GERD: I had a telephone encounter with Alyssa Haynes on September 24, 2018 for which she was complaining of chronic dry cough with associated bile taste in her mouth.  I have started her on omeprazole 40 mg once daily and today she reports her reflux symptoms have subsided however she continues to have ongoing cough with white sputum that is worse at night when she lies down to sleep and subsides when she sits up.  She also complains of some wheezing during the coughing spells for which she will use her albuterol inhaler.  Physical exam today are unrevealing.  Assessment: Her symptoms are most likely related to GERD given that she has had improvement with PPI.  My suspicion for ARVD inducing her chronic cough is very low.  Plan: -Continue omeprazole 40 mg daily.  Can consider increasing frequency to twice daily in the next 4 to 6 weeks if symptoms do not improve -Advised to elevate bed at night to sleep - If no improvement, she already has a PFT order in place since January which can be done to officially diagnose and rule out other reactive or obstructive airway disease.

## 2018-10-08 NOTE — Progress Notes (Signed)
Internal Medicine Clinic Attending  Case discussed with Dr. Agyei at the time of the visit.  We reviewed the resident's history and exam and pertinent patient test results.  I agree with the assessment, diagnosis, and plan of care documented in the resident's note.    

## 2018-10-22 ENCOUNTER — Ambulatory Visit (HOSPITAL_COMMUNITY)
Admission: RE | Admit: 2018-10-22 | Discharge: 2018-10-22 | Disposition: A | Discharge status: Home / Self Care | Payer: Medicare Other | Source: Ambulatory Visit | Attending: Internal Medicine | Admitting: Internal Medicine

## 2018-10-22 ENCOUNTER — Other Ambulatory Visit: Payer: Self-pay

## 2018-10-22 ENCOUNTER — Telehealth: Payer: Self-pay | Admitting: Internal Medicine

## 2018-10-22 ENCOUNTER — Ambulatory Visit (INDEPENDENT_AMBULATORY_CARE_PROVIDER_SITE_OTHER): Payer: Medicare Other | Admitting: Internal Medicine

## 2018-10-22 ENCOUNTER — Encounter: Payer: Self-pay | Admitting: Internal Medicine

## 2018-10-22 ENCOUNTER — Other Ambulatory Visit: Payer: Self-pay | Admitting: Internal Medicine

## 2018-10-22 DIAGNOSIS — J9801 Acute bronchospasm: Secondary | ICD-10-CM | POA: Diagnosis not present

## 2018-10-22 DIAGNOSIS — J40 Bronchitis, not specified as acute or chronic: Secondary | ICD-10-CM | POA: Insufficient documentation

## 2018-10-22 DIAGNOSIS — R05 Cough: Secondary | ICD-10-CM

## 2018-10-22 DIAGNOSIS — Z79899 Other long term (current) drug therapy: Secondary | ICD-10-CM

## 2018-10-22 DIAGNOSIS — R053 Chronic cough: Secondary | ICD-10-CM

## 2018-10-22 DIAGNOSIS — J209 Acute bronchitis, unspecified: Secondary | ICD-10-CM

## 2018-10-22 MED ORDER — DEXTROMETHORPHAN-GUAIFENESIN 10-100 MG/5ML PO SYRP: 5.0000 mL | ORAL_SOLUTION | Freq: Two times a day (BID) | ORAL | 1 refills | Status: DC

## 2018-10-22 MED ORDER — FLUTICASONE FUROATE-VILANTEROL 200-25 MCG/INH IN AEPB: 1.0000 | INHALATION_SPRAY | Freq: Two times a day (BID) | RESPIRATORY_TRACT | 1 refills | Status: DC | PRN

## 2018-10-22 NOTE — Telephone Encounter (Signed)
Breo appeared to have the best insurance coverage for her plan of all the ICS/LABA inhalers.  I will call her and let her know we do have a sample available.

## 2018-10-22 NOTE — Telephone Encounter (Signed)
Pt had tele Health visit today.  Medication Prescribed    fluticasone furoate-vilanterol (BREO ELLIPTA) 200-25 MCG/INH AEPB is $119.00 which is to expensive.   [place]

## 2018-10-22 NOTE — Telephone Encounter (Signed)
She will come by in the morning for the sample

## 2018-10-22 NOTE — Progress Notes (Signed)
Internal Medicine Clinic Attending  Case discussed with Dr. Shan Levans at the time of the visit.  We reviewed the resident's history and exam and pertinent patient test results.  I agree with the assessment, diagnosis, and plan of care documented in the resident's note.   Would consider PFTs given history of asthma and poorly controlled symptoms. Previously ordered in January but not done. Agree with step up in inhaler regimen.

## 2018-10-22 NOTE — Assessment & Plan Note (Addendum)
Assessment: The patient's cough began 5 weeks ago. Covid negative.  Since symptom onset patient's symptoms have improved some. Symptoms seem to alleviate with albuterol inhaler and are aggravated by sleeping, fan or wind, change in weather. Associated symptoms include: SOB, wheezing, productive cough yellow initially now thick and white, . The patient does not have any of the following: GERD, chills, fever. The patient olmesartan on an ace inhibitor. The patient's overall presentation is most consistent with a diagnosis/ddx of acute on chronic asthma/bronchitis.    Plan: -continue allergy/GERD medications -chest x-ray, LABA/ICS inhaler, Robitussin DM -if chest x-ray negative for infiltrate and no improvement with above therapy will consider steroid burst

## 2018-10-22 NOTE — Progress Notes (Signed)
   [facility] Telephone Encounter  Reason for call:   This telephone encounter was created for [name] on 10/22/2018 for the following purpose/cc bronchitisw/bronchospasm.   Pertinent Data:   Persistent cough, treated with claritin and prilosec ROS ROS: Pulmonary: pt endorsing shortness of breath mainly at night Cardiac: pt denies palpitations, chest pain,   Abdominal: pt denies abdominal pain, nausea, vomiting, or diarrhea   Assessment / Plan / Recommendations:  Assessment: The patient's cough began 5 weeks ago. Covid negative.  Since symptom onset patient's symptoms have improved some. Symptoms seem to alleviate with albuterol inhaler and are aggravated by sleeping, fan or wind, change in weather. Associated symptoms include: SOB, wheezing, productive cough yellow initially now thick and white, . The patient does not have any of the following: GERD, chills, fever. The patient olmesartan on an ace inhibitor. The patient's overall presentation is most consistent with a diagnosis/ddx of acute on chronic asthma/bronchitis.    Plan: -continue allergy/GERD medications -chest x-ray, LABA/ICS inhaler, Robitussin DM -if chest x-ray negative for infiltrate and no improvement with above therapy will consider steroid burst   Consent and Medical Decision Making:   Patient discussed with Dr. Rande Brunt  This is a telephone encounter between Alyssa Haynes and Vickki Muff on 10/22/2018 for bronchitis. The visit was conducted with the patient located at home and Vickki Muff at [facility]. The patient's identity was confirmed using their DOB and current address. The patient has consented to being evaluated through a telephone encounter and understands the associated risks (an examination cannot be done and the patient may need to come in for an appointment) / benefits (allows the patient to remain at home, decreasing exposure to coronavirus). I personally spent  16 minutes on medical discussion.

## 2018-10-27 ENCOUNTER — Other Ambulatory Visit: Payer: Self-pay | Admitting: Internal Medicine

## 2018-10-27 DIAGNOSIS — Z1231 Encounter for screening mammogram for malignant neoplasm of breast: Secondary | ICD-10-CM

## 2018-10-28 DIAGNOSIS — E113291 Type 2 diabetes mellitus with mild nonproliferative diabetic retinopathy without macular edema, right eye: Secondary | ICD-10-CM | POA: Diagnosis not present

## 2018-10-28 DIAGNOSIS — H2513 Age-related nuclear cataract, bilateral: Secondary | ICD-10-CM | POA: Diagnosis not present

## 2018-10-28 DIAGNOSIS — H40013 Open angle with borderline findings, low risk, bilateral: Secondary | ICD-10-CM | POA: Diagnosis not present

## 2018-10-28 DIAGNOSIS — H524 Presbyopia: Secondary | ICD-10-CM | POA: Diagnosis not present

## 2018-10-28 DIAGNOSIS — H35033 Hypertensive retinopathy, bilateral: Secondary | ICD-10-CM | POA: Diagnosis not present

## 2018-10-28 LAB — HM DIABETES EYE EXAM

## 2018-11-08 ENCOUNTER — Encounter: Payer: Self-pay | Admitting: Internal Medicine

## 2018-11-09 ENCOUNTER — Other Ambulatory Visit: Payer: Self-pay | Admitting: Internal Medicine

## 2018-11-09 ENCOUNTER — Other Ambulatory Visit: Payer: Self-pay | Admitting: *Deleted

## 2018-11-09 DIAGNOSIS — E119 Type 2 diabetes mellitus without complications: Secondary | ICD-10-CM

## 2018-11-09 DIAGNOSIS — Z794 Long term (current) use of insulin: Secondary | ICD-10-CM

## 2018-11-09 DIAGNOSIS — J069 Acute upper respiratory infection, unspecified: Secondary | ICD-10-CM

## 2018-11-09 MED ORDER — METFORMIN HCL 500 MG PO TABS: 500.0000 mg | ORAL_TABLET | Freq: Two times a day (BID) | ORAL | 11 refills | Status: DC

## 2018-11-09 MED ORDER — LORATADINE 10 MG PO TABS: 10.0000 mg | ORAL_TABLET | Freq: Every day | ORAL | 0 refills | Status: DC

## 2018-11-10 MED ORDER — ACCU-CHEK AVIVA PLUS VI STRP: ORAL_STRIP | 1 refills | Status: AC

## 2018-11-12 ENCOUNTER — Telehealth: Payer: Self-pay

## 2018-11-12 ENCOUNTER — Other Ambulatory Visit: Payer: Self-pay

## 2018-11-12 ENCOUNTER — Encounter: Payer: Self-pay | Admitting: Internal Medicine

## 2018-11-12 ENCOUNTER — Ambulatory Visit (INDEPENDENT_AMBULATORY_CARE_PROVIDER_SITE_OTHER): Payer: Medicare Other | Admitting: Internal Medicine

## 2018-11-12 DIAGNOSIS — Z7951 Long term (current) use of inhaled steroids: Secondary | ICD-10-CM

## 2018-11-12 DIAGNOSIS — J209 Acute bronchitis, unspecified: Secondary | ICD-10-CM

## 2018-11-12 DIAGNOSIS — Z79899 Other long term (current) drug therapy: Secondary | ICD-10-CM

## 2018-11-12 MED ORDER — AZITHROMYCIN 250 MG PO TABS: 250.0000 mg | ORAL_TABLET | Freq: Every day | ORAL | 0 refills | Status: AC

## 2018-11-12 MED ORDER — PREDNISONE 20 MG PO TABS: 40.0000 mg | ORAL_TABLET | Freq: Every day | ORAL | 0 refills | Status: AC

## 2018-11-12 NOTE — Telephone Encounter (Signed)
I think telehealth visit would be fine. We can probably do it today with Winfrey.

## 2018-11-12 NOTE — Assessment & Plan Note (Signed)
Continues coughing, using symbicort, occasionally using albuterol. taking robitussin DM.  Coughing so much feels like she can't get the sputum out.  Has had bronchitis in the past and has had it cleared with abx and prednisone.  This is the longest it has lasted before.  No fevers or chills, no sore throat, not a lot of sinus drainage. Still has some wheezing but it has improved somewhat with inhaler therapy.  Using symbicort daily and rescue inhaler about 3x per day in addition to symbicort, seems to be the only thing that relieves her symptoms.  Seems to happen about once a year.  Subjective history of asthma.  PCP has ordered PFT's.    -will treat with short course of oral prednisone x5 days and azithromycin x5 days -if she fails this trial may need to consider HRCT

## 2018-11-12 NOTE — Telephone Encounter (Signed)
Requesting antibiotic for cough and mucus. Please call pt back.

## 2018-11-12 NOTE — Progress Notes (Signed)
   [facility] Telephone Encounter  Reason for call:   This telephone encounter was created for [name] on 11/12/2018 for the following purpose/cc Bronchitis/Bronchospasm w/ persistent cough.   Pertinent Data:  Past Medical History:  Diagnosis Date  . Anxiety attack 08/22/2017  . Asthma   . Diabetes mellitus   . GERD (gastroesophageal reflux disease)   . Hyperlipidemia   . Hypertension   ROS: Pulmonary: pt denies increased work of breathing, shortness of breath,  Cardiac: pt denies palpitations, chest pain,   Abdominal: pt denies abdominal pain, nausea, vomiting, or diarrhea   Assessment / Plan / Recommendations:   Bronchitis/Bronchospasm w/ persistent cough: Continues coughing, using symbicort, occasionally using albuterol. taking robitussin DM.  Coughing so much feels like she can't get the sputum out.  Has had bronchitis in the past and has had it cleared with abx and prednisone.  This is the longest it has lasted before.  No fevers or chills, no sore throat, not a lot of sinus drainage. Still has some wheezing but it has improved somewhat with inhaler therapy.  Using symbicort daily and rescue inhaler about 3x per day in addition to symbicort, seems to be the only thing that relieves her.  Seems to happen about once a year.    Plan:      -will treat with short course of oral prednisone x5 days and azithromycin x5 days  -if she fails this trial may need to consider HRCT   pt is advised that if symptoms worsen or new symptoms arise, they should go to an urgent care facility or to to ER for further evaluation.   Consent and Medical Decision Making:   Patient discussed with Dr. Evette Doffing  This is a telephone encounter between Hend D Mccartt and Vickki Muff on 11/12/2018 for Bronchitis/Bronchospasm w/ persistent cough. The visit was conducted with the patient located at home and Vickki Muff at [facility]. The patient's identity was  confirmed using their DOB and current address. The patient has consented to being evaluated through a telephone encounter and understands the associated risks (an examination cannot be done and the patient may need to come in for an appointment) / benefits (allows the patient to remain at home, decreasing exposure to coronavirus). I personally spent 16 minutes on medical discussion.

## 2018-11-12 NOTE — Telephone Encounter (Signed)
Patient placed on today's [facility] schedule for telehealth visit. Hubbard Hartshorn, BSN, RN-BC

## 2018-11-13 ENCOUNTER — Ambulatory Visit: Payer: Medicare Other | Admitting: Pharmacist

## 2018-11-13 DIAGNOSIS — E113299 Type 2 diabetes mellitus with mild nonproliferative diabetic retinopathy without macular edema, unspecified eye: Secondary | ICD-10-CM

## 2018-11-13 NOTE — Addendum Note (Signed)
Addended by: Lalla Brothers T on: 11/13/2018 10:21 AM   Modules accepted: Level of Service

## 2018-11-13 NOTE — Progress Notes (Signed)
Internal Medicine Clinic Attending  Case discussed with Dr. Winfrey  at the time of the visit.  We reviewed the resident's history and exam and pertinent patient test results.  I agree with the assessment, diagnosis, and plan of care documented in the resident's note.  

## 2018-11-13 NOTE — Progress Notes (Signed)
Medication Samples have been provided to the patient.  Drug name: Jardiance       Strength: 25 mg        Qty: 4 bottles  LOT: [id number]  Exp.Date: May 2022  Dosing instructions: 1/2 tablet daily  The patient has been instructed regarding the correct time, dose, and frequency of taking this medication, including desired effects and most common side effects.   Alyssa Haynes 9:37 AM 11/13/2018

## 2018-11-19 ENCOUNTER — Encounter: Payer: Self-pay | Admitting: *Deleted

## 2018-11-25 ENCOUNTER — Encounter: Payer: Self-pay | Admitting: Internal Medicine

## 2018-11-26 ENCOUNTER — Ambulatory Visit (INDEPENDENT_AMBULATORY_CARE_PROVIDER_SITE_OTHER): Payer: Medicare Other | Admitting: *Deleted

## 2018-11-26 ENCOUNTER — Other Ambulatory Visit: Payer: Self-pay

## 2018-11-26 DIAGNOSIS — Z23 Encounter for immunization: Secondary | ICD-10-CM | POA: Diagnosis not present

## 2018-11-27 ENCOUNTER — Encounter: Payer: Self-pay | Admitting: Internal Medicine

## 2018-12-01 ENCOUNTER — Other Ambulatory Visit: Payer: Self-pay | Admitting: Internal Medicine

## 2018-12-01 DIAGNOSIS — R05 Cough: Secondary | ICD-10-CM

## 2018-12-01 DIAGNOSIS — R053 Chronic cough: Secondary | ICD-10-CM

## 2018-12-01 NOTE — Progress Notes (Signed)
COVID test ordered.

## 2018-12-01 NOTE — Telephone Encounter (Signed)
Spoke with the pt. She has been sch for 12/11/2018 for her PFT's.  Pt states she will contact their office and resch herself due to a scheduling conflict with her Mammogram office.

## 2018-12-11 ENCOUNTER — Other Ambulatory Visit: Payer: Self-pay

## 2018-12-11 ENCOUNTER — Other Ambulatory Visit (HOSPITAL_COMMUNITY)
Admission: RE | Admit: 2018-12-11 | Discharge: 2018-12-11 | Disposition: A | Discharge status: Home / Self Care | Payer: Medicare Other | Source: Ambulatory Visit | Attending: Internal Medicine | Admitting: Internal Medicine

## 2018-12-11 ENCOUNTER — Encounter (HOSPITAL_COMMUNITY): Payer: Medicare Other

## 2018-12-11 ENCOUNTER — Ambulatory Visit
Admission: RE | Admit: 2018-12-11 | Discharge: 2018-12-11 | Disposition: A | Discharge status: Home / Self Care | Payer: Medicare Other | Source: Ambulatory Visit | Attending: Internal Medicine | Admitting: Internal Medicine

## 2018-12-11 DIAGNOSIS — Z1231 Encounter for screening mammogram for malignant neoplasm of breast: Secondary | ICD-10-CM

## 2018-12-11 DIAGNOSIS — Z20828 Contact with and (suspected) exposure to other viral communicable diseases: Secondary | ICD-10-CM | POA: Insufficient documentation

## 2018-12-11 DIAGNOSIS — Z01812 Encounter for preprocedural laboratory examination: Secondary | ICD-10-CM | POA: Diagnosis not present

## 2018-12-12 LAB — NOVEL CORONAVIRUS, NAA (HOSP ORDER, SEND-OUT TO REF LAB; TAT 18-24 HRS): SARS-CoV-2, NAA: NOT DETECTED

## 2018-12-14 ENCOUNTER — Other Ambulatory Visit: Payer: Self-pay

## 2018-12-14 ENCOUNTER — Ambulatory Visit (HOSPITAL_COMMUNITY)
Admission: RE | Admit: 2018-12-14 | Discharge: 2018-12-14 | Disposition: A | Discharge status: Home / Self Care | Payer: Medicare Other | Source: Ambulatory Visit | Attending: Internal Medicine | Admitting: Internal Medicine

## 2018-12-14 DIAGNOSIS — R053 Chronic cough: Secondary | ICD-10-CM

## 2018-12-14 DIAGNOSIS — R05 Cough: Secondary | ICD-10-CM | POA: Insufficient documentation

## 2018-12-14 LAB — PULMONARY FUNCTION TEST
DL/VA % pred: 99 %
DL/VA: 3.98 ml/min/mmHg/L
DLCO unc % pred: 80 %
DLCO unc: 17.72 ml/min/mmHg
FEF 25-75 Post: 2.42 L/sec
FEF 25-75 Pre: 1.7 L/sec
FEF2575-%Change-Post: 42 %
FEF2575-%Pred-Post: 125 %
FEF2575-%Pred-Pre: 88 %
FEV1-%Change-Post: 10 %
FEV1-%Pred-Post: 106 %
FEV1-%Pred-Pre: 96 %
FEV1-Post: 2.29 L
FEV1-Pre: 2.07 L
FEV1FVC-%Change-Post: 5 %
FEV1FVC-%Pred-Pre: 96 %
FEV6-%Change-Post: 7 %
FEV6-%Pred-Post: 108 %
FEV6-%Pred-Pre: 100 %
FEV6-Post: 2.9 L
FEV6-Pre: 2.7 L
FEV6FVC-%Change-Post: 0 %
FEV6FVC-%Pred-Post: 103 %
FEV6FVC-%Pred-Pre: 103 %
FVC-%Change-Post: 4 %
FVC-%Pred-Post: 104 %
FVC-%Pred-Pre: 100 %
FVC-Post: 2.9 L
FVC-Pre: 2.78 L
Post FEV1/FVC ratio: 79 %
Post FEV6/FVC ratio: 100 %
Pre FEV1/FVC ratio: 75 %
Pre FEV6/FVC Ratio: 99 %
RV % pred: 96 %
RV: 2.31 L
TLC % pred: 91 %
TLC: 5.12 L

## 2018-12-14 MED ORDER — ALBUTEROL SULFATE (2.5 MG/3ML) 0.083% IN NEBU
2.5000 mg | INHALATION_SOLUTION | Freq: Once | RESPIRATORY_TRACT | Status: AC
  Administered 2018-12-14: 2.5 mg via RESPIRATORY_TRACT

## 2018-12-16 ENCOUNTER — Encounter: Payer: Self-pay | Admitting: Internal Medicine

## 2018-12-24 ENCOUNTER — Encounter: Payer: Self-pay | Admitting: Internal Medicine

## 2018-12-25 ENCOUNTER — Encounter: Payer: Self-pay | Admitting: Internal Medicine

## 2018-12-25 MED ORDER — SPIRIVA HANDIHALER 18 MCG IN CAPS: 18.0000 ug | ORAL_CAPSULE | Freq: Every day | RESPIRATORY_TRACT | 2 refills | Status: DC

## 2019-01-19 ENCOUNTER — Encounter: Payer: Self-pay | Admitting: Internal Medicine

## 2019-01-25 ENCOUNTER — Encounter: Payer: Self-pay | Admitting: Internal Medicine

## 2019-01-27 MED ORDER — EMPAGLIFLOZIN 10 MG PO TABS: 10.0000 mg | ORAL_TABLET | Freq: Every day | ORAL | 3 refills | Status: DC

## 2019-02-21 ENCOUNTER — Other Ambulatory Visit: Payer: Self-pay | Admitting: Internal Medicine

## 2019-02-21 DIAGNOSIS — J069 Acute upper respiratory infection, unspecified: Secondary | ICD-10-CM

## 2019-02-21 DIAGNOSIS — E1169 Type 2 diabetes mellitus with other specified complication: Secondary | ICD-10-CM

## 2019-02-26 ENCOUNTER — Other Ambulatory Visit: Payer: Self-pay | Admitting: Internal Medicine

## 2019-02-26 DIAGNOSIS — E785 Hyperlipidemia, unspecified: Secondary | ICD-10-CM

## 2019-02-26 DIAGNOSIS — E1169 Type 2 diabetes mellitus with other specified complication: Secondary | ICD-10-CM

## 2019-03-15 ENCOUNTER — Encounter: Payer: Self-pay | Admitting: Internal Medicine

## 2019-03-26 ENCOUNTER — Encounter: Payer: Self-pay | Admitting: Internal Medicine

## 2019-03-26 ENCOUNTER — Other Ambulatory Visit: Payer: Self-pay

## 2019-03-26 ENCOUNTER — Ambulatory Visit (INDEPENDENT_AMBULATORY_CARE_PROVIDER_SITE_OTHER): Payer: Medicare Other | Admitting: Internal Medicine

## 2019-03-26 VITALS — BP 139/73 | HR 74 | Temp 98.7°F | Ht 67.0 in | Wt 173.0 lb

## 2019-03-26 DIAGNOSIS — E113299 Type 2 diabetes mellitus with mild nonproliferative diabetic retinopathy without macular edema, unspecified eye: Secondary | ICD-10-CM | POA: Diagnosis not present

## 2019-03-26 DIAGNOSIS — I10 Essential (primary) hypertension: Secondary | ICD-10-CM | POA: Diagnosis not present

## 2019-03-26 DIAGNOSIS — Z79899 Other long term (current) drug therapy: Secondary | ICD-10-CM

## 2019-03-26 DIAGNOSIS — E1169 Type 2 diabetes mellitus with other specified complication: Secondary | ICD-10-CM | POA: Diagnosis not present

## 2019-03-26 DIAGNOSIS — M1990 Unspecified osteoarthritis, unspecified site: Secondary | ICD-10-CM

## 2019-03-26 DIAGNOSIS — Z794 Long term (current) use of insulin: Secondary | ICD-10-CM | POA: Diagnosis not present

## 2019-03-26 DIAGNOSIS — E785 Hyperlipidemia, unspecified: Secondary | ICD-10-CM

## 2019-03-26 DIAGNOSIS — K219 Gastro-esophageal reflux disease without esophagitis: Secondary | ICD-10-CM

## 2019-03-26 DIAGNOSIS — L720 Epidermal cyst: Secondary | ICD-10-CM | POA: Diagnosis not present

## 2019-03-26 DIAGNOSIS — F419 Anxiety disorder, unspecified: Secondary | ICD-10-CM

## 2019-03-26 DIAGNOSIS — J45909 Unspecified asthma, uncomplicated: Secondary | ICD-10-CM

## 2019-03-26 DIAGNOSIS — Z0000 Encounter for general adult medical examination without abnormal findings: Secondary | ICD-10-CM

## 2019-03-26 DIAGNOSIS — Z7984 Long term (current) use of oral hypoglycemic drugs: Secondary | ICD-10-CM

## 2019-03-26 LAB — GLUCOSE, CAPILLARY: Glucose-Capillary: 161 mg/dL — ABNORMAL HIGH (ref 70–99)

## 2019-03-26 LAB — POCT GLYCOSYLATED HEMOGLOBIN (HGB A1C): Hemoglobin A1C: 8.3 % — AB (ref 4.0–5.6)

## 2019-03-26 MED ORDER — BIOTIN 1 MG PO CAPS: 1.0000 mg | ORAL_CAPSULE | Freq: Every day | ORAL | 0 refills | Status: AC

## 2019-03-26 MED ORDER — APPLE CIDER VINEGAR PLUS PO TABS: ORAL_TABLET | ORAL | 0 refills | Status: DC

## 2019-03-26 NOTE — Assessment & Plan Note (Signed)
FIT test provided today.  ?

## 2019-03-26 NOTE — Progress Notes (Signed)
   Subjective:    Patient ID: Alyssa Haynes, female    DOB: [birth year], 74 y.o.   MRN: [id number]  CC: 6 month follow up for DM2, HLD  HPI  Alyssa Haynes is a 74 year old woman with PMH of DM2, HLD, HTN, asthma, GERD, OA who presents for follow up.   Alyssa Haynes reports that she is doing well and has no complaints.  She has lost about 8 pounds since last being here.  She notes that she has not had any concerning symptoms, but has found that she is not as hungry as she used to be.  She is on Jardiance which can help with weight loss.  She screened negative for any depressive symptoms.  She does note that she has trouble remembering to take her medications every day and that she is not exercising.    Her A1C is 8.3 which is higher than last check.  I would like her goal to be closer to 7.5 or 8.0.  We discussed how to remember to take your medications every day and I tasked her with getting into the habit of taking her medications every day.  I also encouraged her to walk daily.    She has a small cyst on her chest which is very superficial, no change to the skin overlying.  Appears to be an inclusion cyst.  She will monitor it and let me know if it worsens.   We reviewed and updated her medication list.   Review of Systems  Constitutional: Positive for appetite change. Negative for activity change and unexpected weight change.  Respiratory: Negative for cough and shortness of breath.   Cardiovascular: Negative for chest pain and leg swelling.  Gastrointestinal: Negative for abdominal pain, constipation and diarrhea.  Genitourinary: Negative for dysuria.  Musculoskeletal: Positive for arthralgias (ankle at the site of previous break). Negative for gait problem and joint swelling.  Skin:       Small 72mmX5mm cyst on anterior chest at the 4th rib, left of the sternum  Psychiatric/Behavioral: Negative for decreased concentration and dysphoric mood. The patient is nervous/anxious.         Objective:   Physical Exam Vitals and nursing note reviewed.  Constitutional:      General: She is not in acute distress.    Appearance: Normal appearance. She is not toxic-appearing.  HENT:     Head: Normocephalic and atraumatic.  Cardiovascular:     Rate and Rhythm: Normal rate and regular rhythm.     Pulses: Normal pulses.     Heart sounds: No murmur.  Pulmonary:     Effort: Pulmonary effort is normal. No respiratory distress.  Musculoskeletal:        General: No swelling or tenderness.     Right lower leg: No edema.     Left lower leg: No edema.  Skin:    General: Skin is warm and dry.     Comments: Small inclusion cyst anterior chest  Neurological:     Mental Status: She is alert and oriented to person, place, and time. Mental status is at baseline.  Psychiatric:        Mood and Affect: Mood normal.        Behavior: Behavior normal.     A1C and Lipid panel today.       Assessment & Plan:  Return in 3 months.

## 2019-03-26 NOTE — Assessment & Plan Note (Signed)
Well controlled at last check, on atorvastatin.   Plan:  Continue atorvastatin Check lipid panel today.

## 2019-03-26 NOTE — Assessment & Plan Note (Signed)
Small, well circumcised.  Continue to monitor.

## 2019-03-26 NOTE — Patient Instructions (Signed)
Alyssa Haynes - -  You have lost about 8 pounds.  I think this is due to the Jardiance which can help with weight loss.  I would like you to try and take this medication and your metformin every day over the next 3 months.  I will see you back then to check you A1C.   Thank you!

## 2019-03-26 NOTE — Assessment & Plan Note (Signed)
She is doing well.  No issues with breathing today.  She notes that she is using her husband's symbicort which he gets from the [place] because she cannot afford this medication herself.  She is not taking daily.  PFTs were reviewed and she has mild obstructive disease.    Continue to monitor.

## 2019-03-26 NOTE — Assessment & Plan Note (Signed)
She is on omeprazole with good results.  She has no break through symptoms.   Plan Continue omeprazole.

## 2019-03-26 NOTE — Assessment & Plan Note (Signed)
BP today is 139/73.  She takes olmesartan which we will continue.  Renal function at last check was stable.   Plan Continue olmesartan

## 2019-03-26 NOTE — Assessment & Plan Note (Signed)
She is currently on Jardiance and metformin.  She forgets to take her medications sometimes and her BS's are going up along with her A1C. We reviewed her BS log and she has elevated blood sugars up to the low 300s.  We discussed at length how to take her medications and working on consistent intake.  She has lost weight, about 8 pounds since August.  I think this is likely due to [facility] given her symptoms.  She has no concerning symptoms for malignancy, but will need to continue to monitor closely.  We discussed exercise as well.   Her LDL is well controlled, check today.  Her BP is mildly high, but will continue to monitor.  Foot exam today, pulses are intact in DP bilaterally.  Renal function has been stable.    Plan Continue Jardiance and Metformin, but encouraged to take daily Would start walking once weather is improved Return in 3 months.

## 2019-03-27 LAB — LIPID PANEL
Chol/HDL Ratio: 2.2 ratio (ref 0.0–4.4)
Cholesterol, Total: 136 mg/dL (ref 100–199)
HDL: 62 mg/dL (ref >39)
LDL Chol Calc (NIH): 57 mg/dL (ref 0–99)
Triglycerides: 90 mg/dL (ref 0–149)
VLDL Cholesterol Cal: 17 mg/dL (ref 5–40)

## 2019-03-30 ENCOUNTER — Encounter: Payer: Self-pay | Admitting: Internal Medicine

## 2019-04-27 DIAGNOSIS — H40013 Open angle with borderline findings, low risk, bilateral: Secondary | ICD-10-CM | POA: Diagnosis not present

## 2019-04-27 DIAGNOSIS — H524 Presbyopia: Secondary | ICD-10-CM | POA: Diagnosis not present

## 2019-05-03 ENCOUNTER — Other Ambulatory Visit: Payer: Self-pay | Admitting: Internal Medicine

## 2019-05-03 DIAGNOSIS — J069 Acute upper respiratory infection, unspecified: Secondary | ICD-10-CM

## 2019-05-24 ENCOUNTER — Other Ambulatory Visit: Payer: Self-pay | Admitting: Internal Medicine

## 2019-05-24 DIAGNOSIS — E785 Hyperlipidemia, unspecified: Secondary | ICD-10-CM

## 2019-05-24 DIAGNOSIS — E1169 Type 2 diabetes mellitus with other specified complication: Secondary | ICD-10-CM

## 2019-05-24 NOTE — Telephone Encounter (Signed)
plos sch May appt PCP

## 2019-05-25 ENCOUNTER — Encounter: Payer: Self-pay | Admitting: Internal Medicine

## 2019-05-25 DIAGNOSIS — L720 Epidermal cyst: Secondary | ICD-10-CM

## 2019-06-02 ENCOUNTER — Other Ambulatory Visit: Payer: Self-pay

## 2019-06-02 ENCOUNTER — Encounter: Payer: Self-pay | Admitting: Internal Medicine

## 2019-06-02 ENCOUNTER — Ambulatory Visit (INDEPENDENT_AMBULATORY_CARE_PROVIDER_SITE_OTHER): Payer: Medicare Other | Admitting: Internal Medicine

## 2019-06-02 VITALS — Ht 67.75 in | Wt 169.0 lb

## 2019-06-02 DIAGNOSIS — Z0000 Encounter for general adult medical examination without abnormal findings: Secondary | ICD-10-CM

## 2019-06-02 NOTE — Progress Notes (Signed)
This AWV is being conducted by [facility] only. The patient was located at home and I was located in [facility]. The patient's identity was confirmed using their DOB and current address. The patient or his/her legal guardian has consented to being evaluated through a telephone encounter and understands the associated risks (an examination cannot be done and the patient may need to come in for an appointment) / benefits (allows the patient to remain at home, decreasing exposure to coronavirus). I personally spent 32 minutes conducting the AWV.  Subjective:   Alyssa Haynes is a 74 y.o. female who presents for a Medicare Annual Wellness Visit.  The following items have been reviewed and updated today in the appropriate area in the EMR.   Health Risk Assessment  Height, weight, BMI, and BP Visual acuity if needed Depression screen Fall risk / safety level Advance directive discussion Medical and family history were reviewed and updated Updating list of other providers & suppliers Medication reconciliation, including over the counter medicines Cognitive screen Written screening schedule Risk Factor list Personalized health advice, risky behaviors, and treatment advice  Social History   Social History Narrative   Current Social History 06/02/2019        Patient lives with spouse in a one level home with 2-3 outside steps with handrails       Patient's method of transportation is personal car.      The highest level of education was Bachelor's Degree      The patient currently retired from the [employer]      Identified important Relationships are "My husband and all my children."       Pets : 1 dog, Scientist, research (physical sciences) / Fun: "Going to [employer], watching old movies, working in the [employer] garden."       Current Stressors: "My husband."       Religious / Personal Beliefs: "Baptist"       L. Hedi Barkan, BSN, RN-BC            Objective:    Vitals: Ht 5' 7.75"  (1.721 m)   Wt 169 lb (76.7 kg)   BMI 25.89 kg/m  Vitals are unable to obtained due to [id number] public health emergency  Activities of Daily Living In your present state of health, do you have any difficulty performing the following activities: 06/02/2019 03/26/2019  Hearing? N N  Vision? N N  Comment - -  Difficulty concentrating or making decisions? Y N  Walking or climbing stairs? N N  Dressing or bathing? N N  Doing errands, shopping? N N  Some recent data might be hidden    Goals Goals    . Blood Pressure < 140/90    . Continue walking 3x per week (45 min per time) (pt-stated)     Also, begin seated and standing exercises with exercise band.     Marland Kitchen HEMOGLOBIN A1C < 7.0    . LDL CALC < 100    . Plan meals- 50-60 grams carb for meals 40-45 for snack       Fall Risk Fall Risk  06/02/2019 03/26/2019 10/05/2018 03/04/2018 02/26/2018  Falls in the past year? 0 0 0 0 0  Number falls in past yr: - - - - -  Comment - - - - -  Injury with Fall? - - - - -  Comment - - - - -  Risk Factor Category  - - - - -  Risk for fall due to : No Fall Risks - - - -  Follow up Education provided;Falls prevention discussed Falls prevention discussed Falls prevention discussed Falls prevention discussed -   CDC Handout on Fall Prevention and Handout on Home Exercise Program, Access codes [id number] and [id number] given/mailed to patient with red exercise band.    Depression Screen PHQ 2/9 Scores 06/02/2019 03/26/2019 10/05/2018 03/04/2018  PHQ - 2 Score 1 0 1 0  PHQ- 9 Score 2 1 3  -    Patient sees counselor weekly via [facility]   "I would like to ask you some questions that ask you to use your memory. I am going to name three objects. Please wait until I say all three words, then repeat them. Remember what they are  because I am going to ask you to name them again in a few minutes. Please repeat these words for me: APPLE--TABLE--PENNY." (Interviewer may repeat  names 3 times if necessary but repetition not scored.)  Did patient correctly repeat all three words? Yes - may proceed with screen  What year is this? Correct What month is this? Correct What day of the week is this? Correct  What were the three objects I asked you to remember? . Apple Correct . Table Correct . Penny Correct  Score one point for each incorrect answer.  A score of 2 or more points warrants additional investigation.  Patient's score 0   Assessment and Plan:     Patient will continue walking 3 times per week, 45 minutes each time She will begin seated and standing exercises with exercise band. She will obtain Prevnar 13 vaccine at her next office visit  During the course of the visit the patient was educated and counseled about appropriate screening and preventive services as documented in the assessment and plan.  The printed AVS was given to the patient and included an updated screening schedule, a list of risk factors, and personalized health advice.        Velora Heckler, RN  06/02/2019  I discussed the AWV findings with the RN who conducted the visit. I was present in the office suite and immediately available to provide assistance and direction throughout the time the service was provided. Maudie Mercury, MD IMTS, PGY-1 06/02/2019,1:06 PM

## 2019-06-02 NOTE — Patient Instructions (Addendum)
Annual Wellness Visit   Medicare Covered Preventative Screenings and Services  Services & Screenings Men and Women Who How Often Need? Date of Last Service Action  Abdominal Aortic Aneurysm Adults with AAA risk factors Once     Alcohol Misuse and Counseling All Adults Screening once a year if no alcohol misuse. Counseling up to 4 face to face sessions.     Bone Density Measurement  Adults at risk for osteoporosis Once every 2 yrs     Lipid Panel Z13.6 All adults without CV disease Once every 5 yrs     Colorectal Cancer   Stool sample or  Colonoscopy All adults 51 and older   Once every year  Every 10 years     Depression All Adults Once a year  Today   Diabetes Screening Blood glucose, post glucose load, or GTT Z13.1  All adults at risk  Pre-diabetics  Once per year  Twice per year     Diabetes  Self-Management Training All adults Diabetics 10 hrs first year; 2 hours subsequent years. Requires Copay     Glaucoma  Diabetics  Family history of glaucoma  African Americans 36 yrs +  Hispanic Americans 84 yrs + Annually - requires coppay     Hepatitis C Z72.89 or F19.20  High Risk for HCV  Born between 1945 and 1965  Annually  Once     HIV Z11.4 All adults based on risk  Annually btw ages 59 & 64 regardless of risk  Annually > 65 yrs if at increased risk     Lung Cancer Screening Asymptomatic adults aged 42-77 with 30 pack yr history and current smoker OR quit within the last 15 yrs Annually Must have counseling and shared decision making documentation before first screen     Medical Nutrition Therapy Adults with   Diabetes  Renal disease  Kidney transplant within past 3 yrs 3 hours first year; 2 hours subsequent years     Obesity and Counseling All adults Screening once a year Counseling if BMI 30 or higher  Today   Tobacco Use Counseling Adults who use tobacco  Up to 8 visits in one year     Vaccines Z23  Hepatitis B  Influenza   Pneumonia  Adults    Once  Once every flu season  Two different vaccines separated by one year     Next Annual Wellness Visit People with Medicare Every year  Today     Services & Screenings Women Who How Often Need  Date of Last Service Action  Mammogram  Z12.31 Women over 13 One baseline ages 16-39. Annually ager 40 yrs+     Pap tests All women Annually if high risk. Every 2 yrs for normal risk women     Screening for cervical cancer with   Pap (Z01.419 nl or Z01.411abnl) &  HPV Z11.51 Women aged 18 to 30 Once every 5 yrs     Screening pelvic and breast exams All women Annually if high risk. Every 2 yrs for normal risk women     Sexually Transmitted Diseases  Chlamydia  Gonorrhea  Syphilis All at risk adults Annually for non pregnant females at increased risk         [place] Men Who How Ofter Need  Date of Last Service Action  Prostate Cancer - DRE & PSA Men over 50 Annually.  DRE might require a copay.     Sexually Transmitted Diseases  Syphilis All at risk adults Annually  for men at increased risk         Things That May Be Affecting Your Health:  Alcohol  Hearing loss  Pain    Depression  Home Safety  Sexual Health   Diabetes  Lack of physical activity  Stress   Difficulty with daily activities  Loneliness  Tiredness   Drug use  Medicines  Tobacco use   Falls  Motor Vehicle Safety  Weight   Food choices  Oral Health  Other    YOUR PERSONALIZED HEALTH PLAN : 1. Schedule your next subsequent Medicare Wellness visit in one year 2. Attend all of your regular appointments to address your medical issues 3. Complete the preventative screenings and services 4. Continue walking 3 times per week, 45 minutes each time 5. Begin seated and standing exercises with exercise band. 6. Obtain Prevnar 13 vaccine at your next office visit    Diabetes Mellitus and [place] care is an important part of your health, especially when you have diabetes. Diabetes may cause  you to have problems because of poor blood flow (circulation) to your feet and legs, which can cause your skin to:  Become thinner and drier.  Break more easily.  Heal more slowly.  Peel and crack. You may also have nerve damage (neuropathy) in your legs and feet, causing decreased feeling in them. This means that you may not notice minor injuries to your feet that could lead to more serious problems. Noticing and addressing any potential problems early is the best way to prevent future foot problems. How to care for your feet Foot hygiene  Wash your feet daily with warm water and mild soap. Do not use hot water. Then, pat your feet and the areas between your toes until they are completely dry. Do not soak your feet as this can dry your skin.  Trim your toenails straight across. Do not dig under them or around the cuticle. File the edges of your nails with an emery board or nail file.  Apply a moisturizing lotion or petroleum jelly to the skin on your feet and to dry, brittle toenails. Use lotion that does not contain alcohol and is unscented. Do not apply lotion between your toes. Shoes and socks  Wear clean socks or stockings every day. Make sure they are not too tight. Do not wear knee-high stockings since they may decrease blood flow to your legs.  Wear shoes that fit properly and have enough cushioning. Always look in your shoes before you put them on to be sure there are no objects inside.  To break in new shoes, wear them for just a few hours a day. This prevents injuries on your feet. Wounds, scrapes, corns, and calluses  Check your feet daily for blisters, cuts, bruises, sores, and redness. If you cannot see the bottom of your feet, use a mirror or ask someone for help.  Do not cut corns or calluses or try to remove them with medicine.  If you find a minor scrape, cut, or break in the skin on your feet, keep it and the skin around it clean and dry. You may clean these areas  with mild soap and water. Do not clean the area with peroxide, alcohol, or iodine.  If you have a wound, scrape, corn, or callus on your foot, look at it several times a day to make sure it is healing and not infected. Check for: ? Redness, swelling, or pain. ? Fluid or blood. ? Warmth. ?  Pus or a bad smell. General instructions  Do not cross your legs. This may decrease blood flow to your feet.  Do not use heating pads or hot water bottles on your feet. They may burn your skin. If you have lost feeling in your feet or legs, you may not know this is happening until it is too late.  Protect your feet from hot and cold by wearing shoes, such as at the beach or on hot pavement.  Schedule a complete foot exam at least once a year (annually) or more often if you have foot problems. If you have foot problems, report any cuts, sores, or bruises to your health care provider immediately. Contact a health care provider if:  You have a medical condition that increases your risk of infection and you have any cuts, sores, or bruises on your feet.  You have an injury that is not healing.  You have redness on your legs or feet.  You feel burning or tingling in your legs or feet.  You have pain or cramps in your legs and feet.  Your legs or feet are numb.  Your feet always feel cold.  You have pain around a toenail. Get help right away if:  You have a wound, scrape, corn, or callus on your foot and: ? You have pain, swelling, or redness that gets worse. ? You have fluid or blood coming from the wound, scrape, corn, or callus. ? Your wound, scrape, corn, or callus feels warm to the touch. ? You have pus or a bad smell coming from the wound, scrape, corn, or callus. ? You have a fever. ? You have a red line going up your leg. Summary  Check your feet every day for cuts, sores, red spots, swelling, and blisters.  Moisturize feet and legs daily.  Wear shoes that fit properly and have  enough cushioning.  If you have foot problems, report any cuts, sores, or bruises to your health care provider immediately.  Schedule a complete foot exam at least once a year (annually) or more often if you have foot problems. This information is not intended to replace advice given to you by your health care provider. Make sure you discuss any questions you have with your health care provider. Document Revised: 10/28/2018 Document Reviewed: 03/08/2016 Elsevier Patient Education  [place] Prevention in the Home, Adult Falls can cause injuries. They can happen to people of all ages. There are many things you can do to make your home safe and to help prevent falls. Ask for help when making these changes, if needed. What actions can I take to prevent falls? General Instructions  Use good lighting in all rooms. Replace any light bulbs that burn out.  Turn on the lights when you go into a dark area. Use night-lights.  Keep items that you use often in easy-to-reach places. Lower the shelves around your home if necessary.  Set up your furniture so you have a clear path. Avoid moving your furniture around.  Do not have throw rugs and other things on the floor that can make you trip.  Avoid walking on wet floors.  If any of your floors are uneven, fix them.  Add color or contrast paint or tape to clearly mark and help you see: ? Any grab bars or handrails. ? First and last steps of stairways. ? Where the edge of each step is.  If you use a stepladder: ? Make sure  that it is fully opened. Do not climb a closed stepladder. ? Make sure that both sides of the stepladder are locked into place. ? Ask someone to hold the stepladder for you while you use it.  If there are any pets around you, be aware of where they are. What can I do in the bathroom?      Keep the floor dry. Clean up any water that spills onto the floor as soon as it happens.  Remove soap buildup in  the tub or shower regularly.  Use non-skid mats or decals on the floor of the tub or shower.  Attach bath mats securely with double-sided, non-slip rug tape.  If you need to sit down in the shower, use a plastic, non-slip stool.  Install grab bars by the toilet and in the tub and shower. Do not use towel bars as grab bars. What can I do in the bedroom?  Make sure that you have a light by your bed that is easy to reach.  Do not use any sheets or blankets that are too big for your bed. They should not hang down onto the floor.  Have a firm chair that has side arms. You can use this for support while you get dressed. What can I do in the kitchen?  Clean up any spills right away.  If you need to reach something above you, use a strong step stool that has a grab bar.  Keep electrical cords out of the way.  Do not use floor polish or wax that makes floors slippery. If you must use wax, use non-skid floor wax. What can I do with my stairs?  Do not leave any items on the stairs.  Make sure that you have a light switch at the top of the stairs and the bottom of the stairs. If you do not have them, ask someone to add them for you.  Make sure that there are handrails on both sides of the stairs, and use them. Fix handrails that are broken or loose. Make sure that handrails are as long as the stairways.  Install non-slip stair treads on all stairs in your home.  Avoid having throw rugs at the top or bottom of the stairs. If you do have throw rugs, attach them to the floor with carpet tape.  Choose a carpet that does not hide the edge of the steps on the stairway.  Check any carpeting to make sure that it is firmly attached to the stairs. Fix any carpet that is loose or worn. What can I do on the outside of my home?  Use bright outdoor lighting.  Regularly fix the edges of walkways and driveways and fix any cracks.  Remove anything that might make you trip as you walk through a door,  such as a raised step or threshold.  Trim any bushes or trees on the path to your home.  Regularly check to see if handrails are loose or broken. Make sure that both sides of any steps have handrails.  Install guardrails along the edges of any raised decks and porches.  Clear walking paths of anything that might make someone trip, such as tools or rocks.  Have any leaves, snow, or ice cleared regularly.  Use sand or salt on walking paths during winter.  Clean up any spills in your garage right away. This includes grease or oil spills. What other actions can I take?  Wear shoes that: ? Have a low heel.  Do not wear high heels. ? Have rubber bottoms. ? Are comfortable and fit you well. ? Are closed at the toe. Do not wear open-toe sandals.  Use tools that help you move around (mobility aids) if they are needed. These include: ? Canes. ? Walkers. ? Scooters. ? Crutches.  Review your medicines with your doctor. Some medicines can make you feel dizzy. This can increase your chance of falling. Ask your doctor what other things you can do to help prevent falls. Where to find more information  Centers for Disease Control and Prevention, [facility]: https://garcia.biz/  [employer] on Aging: BrainJudge.co.uk Contact a doctor if:  You are afraid of falling at home.  You feel weak, drowsy, or dizzy at home.  You fall at home. Summary  There are many simple things that you can do to make your home safe and to help prevent falls.  Ways to make your home safe include removing tripping hazards and installing grab bars in the bathroom.  Ask for help when making these changes in your home. This information is not intended to replace advice given to you by your health care provider. Make sure you discuss any questions you have with your health care provider. Document Revised: 05/28/2018 Document Reviewed: 09/19/2016 Elsevier Patient Education  2020 [place] Maintenance, Female Adopting a healthy lifestyle and getting preventive care are important in promoting health and wellness. Ask your health care provider about:  The right schedule for you to have regular tests and exams.  Things you can do on your own to prevent diseases and keep yourself healthy. What should I know about diet, weight, and exercise? Eat a healthy diet   Eat a diet that includes plenty of vegetables, fruits, low-fat dairy products, and lean protein.  Do not eat a lot of foods that are high in solid fats, added sugars, or sodium. Maintain a healthy weight Body mass index (BMI) is used to identify weight problems. It estimates body fat based on height and weight. Your health care provider can help determine your BMI and help you achieve or maintain a healthy weight. Get regular exercise Get regular exercise. This is one of the most important things you can do for your health. Most adults should:  Exercise for at least 150 minutes each week. The exercise should increase your heart rate and make you sweat (moderate-intensity exercise).  Do strengthening exercises at least twice a week. This is in addition to the moderate-intensity exercise.  Spend less time sitting. Even light physical activity can be beneficial. Watch cholesterol and blood lipids Have your blood tested for lipids and cholesterol at 72 years of age, then have this test every 5 years. Have your cholesterol levels checked more often if:  Your lipid or cholesterol levels are high.  You are older than 72 years of age.  You are at high risk for heart disease. What should I know about cancer screening? Depending on your health history and family history, you may need to have cancer screening at various ages. This may include screening for:  Breast cancer.  Cervical cancer.  Colorectal cancer.  Skin cancer.  Lung cancer. What should I know about heart disease, diabetes, and high blood  pressure? Blood pressure and heart disease  High blood pressure causes heart disease and increases the risk of stroke. This is more likely to develop in people who have high blood pressure readings, are of African descent, or are overweight.  Have your blood  pressure checked: ? Every 3-5 years if you are 60-54 years of age. ? Every year if you are 30 years old or older. Diabetes Have regular diabetes screenings. This checks your fasting blood sugar level. Have the screening done:  Once every three years after age 38 if you are at a normal weight and have a low risk for diabetes.  More often and at a younger age if you are overweight or have a high risk for diabetes. What should I know about preventing infection? Hepatitis B If you have a higher risk for hepatitis B, you should be screened for this virus. Talk with your health care provider to find out if you are at risk for hepatitis B infection. Hepatitis C Testing is recommended for:  Everyone born from [birth year] through 1965.  Anyone with known risk factors for hepatitis C. Sexually transmitted infections (STIs)  Get screened for STIs, including gonorrhea and chlamydia, if: ? You are sexually active and are younger than 72 years of age. ? You are older than 73 years of age and your health care provider tells you that you are at risk for this type of infection. ? Your sexual activity has changed since you were last screened, and you are at increased risk for chlamydia or gonorrhea. Ask your health care provider if you are at risk.  Ask your health care provider about whether you are at high risk for HIV. Your health care provider may recommend a prescription medicine to help prevent HIV infection. If you choose to take medicine to prevent HIV, you should first get tested for HIV. You should then be tested every 3 months for as long as you are taking the medicine. Pregnancy  If you are about to stop having your period (premenopausal) and  you may become pregnant, seek counseling before you get pregnant.  Take 400 to 800 micrograms (mcg) of folic acid every day if you become pregnant.  Ask for birth control (contraception) if you want to prevent pregnancy. Osteoporosis and menopause Osteoporosis is a disease in which the bones lose minerals and strength with aging. This can result in bone fractures. If you are 27 years old or older, or if you are at risk for osteoporosis and fractures, ask your health care provider if you should:  Be screened for bone loss.  Take a calcium or vitamin D supplement to lower your risk of fractures.  Be given hormone replacement therapy (HRT) to treat symptoms of menopause. Follow these instructions at home: Lifestyle  Do not use any products that contain nicotine or tobacco, such as cigarettes, e-cigarettes, and chewing tobacco. If you need help quitting, ask your health care provider.  Do not use street drugs.  Do not share needles.  Ask your health care provider for help if you need support or information about quitting drugs. Alcohol use  Do not drink alcohol if: ? Your health care provider tells you not to drink. ? You are pregnant, may be pregnant, or are planning to become pregnant.  If you drink alcohol: ? Limit how much you use to 0-1 drink a day. ? Limit intake if you are breastfeeding.  Be aware of how much alcohol is in your drink. In the [place] one drink equals one 12 oz bottle of beer (355 mL), one 5 oz glass of wine (148 mL), or one 1 oz glass of hard liquor (44 mL). General instructions  Schedule regular health, dental, and eye exams.  Stay current with your vaccines.  Tell your health care provider if: ? You often feel depressed. ? You have ever been abused or do not feel safe at home. Summary  Adopting a healthy lifestyle and getting preventive care are important in promoting health and wellness.  Follow your health care provider's instructions about healthy  diet, exercising, and getting tested or screened for diseases.  Follow your health care provider's instructions on monitoring your cholesterol and blood pressure. This information is not intended to replace advice given to you by your health care provider. Make sure you discuss any questions you have with your health care provider. Document Revised: 01/28/2018 Document Reviewed: 01/28/2018 Elsevier Patient Education  2020 [employer].

## 2019-06-03 NOTE — Progress Notes (Signed)
[facility] Clinic Attending  Case discussed with Dr. Gilford Rile soon after the resident saw the patient.  We reviewed the AWV findings.  I agree with the assessment, diagnosis, and plan of care documented in the AWV note.

## 2019-06-10 ENCOUNTER — Encounter: Payer: Self-pay | Admitting: *Deleted

## 2019-06-10 NOTE — Progress Notes (Unsigned)

## 2019-06-11 ENCOUNTER — Other Ambulatory Visit: Payer: Self-pay | Admitting: Internal Medicine

## 2019-06-11 NOTE — Progress Notes (Signed)
Things That May Be Affecting Your Health:  Alcohol  Hearing loss  Pain   X Depression  Home Safety  Sexual Health  X Diabetes  Lack of physical activity X Stress   Difficulty with daily activities  Loneliness  Tiredness   Drug use  Medicines  Tobacco use   Falls  Motor Vehicle Safety  Weight   Food choices  Oral Health  Other    YOUR PERSONALIZED HEALTH PLAN : 1. Schedule your next subsequent Medicare Wellness visit in one year 2. Attend all of your regular appointments to address your medical issues 3. Complete the preventative screenings and services   Annual Wellness Visit   Medicare Covered Preventative Screenings and [place] Men and Women Who How Often Need? Date of Last Service Action  Abdominal Aortic Aneurysm Adults with AAA risk factors Once     Alcohol Misuse and Counseling All Adults Screening once a year if no alcohol misuse. Counseling up to 4 face to face sessions.     Bone Density Measurement  Adults at risk for osteoporosis Once every 2 yrs Yes 2018 Osteopenia  Lipid Panel Z13.6 All adults without CV disease Once every 5 yrs No    Colorectal Cancer   Stool sample or  Colonoscopy All adults 4 and older   Once every year  Every 10 years Yes 2019 FIT negative  Depression All Adults Once a year  Today   Diabetes Screening Blood glucose, post glucose load, or GTT Z13.1  All adults at risk  Pre-diabetics  Once per year  Twice per year No    Diabetes  Self-Management Training All adults Diabetics 10 hrs first year; 2 hours subsequent years. Requires Copay Yes  Discuss if she would like referral  Glaucoma  Diabetics  Family history of glaucoma  African Americans 17 yrs +  Hispanic Americans 38 yrs + Annually - requires coppay No    Hepatitis C Z72.89 or F19.20  High Risk for HCV  Born between 1945 and 1965  Annually  Once No    HIV Z11.4 All adults based on risk  Annually btw ages 59 & 7 regardless of risk  Annually >  65 yrs if at increased risk Yes Never Please order  Lung Cancer Screening Asymptomatic adults aged 41-77 with 30 pack yr history and current smoker OR quit within the last 15 yrs Annually Must have counseling and shared decision making documentation before first screen No  Quit > 15 years ago  Medical Nutrition Therapy Adults with   Diabetes  Renal disease  Kidney transplant within past 3 yrs 3 hours first year; 2 hours subsequent years     Obesity and Counseling All adults Screening once a year Counseling if BMI 30 or higher Yes Today   Tobacco Use Counseling Adults who use tobacco  Up to 8 visits in one year     Vaccines Z23  Hepatitis B  Influenza   Pneumonia  Adults   Once  Once every flu season  Two different vaccines separated by one year Yes  Please ask about HBV vaccine  Next Annual Wellness Visit People with Medicare Every year  Today     Services & Screenings Women Who How Often Need  Date of Last Service Action  Mammogram  Z12.31 Women over 13 One baseline ages 25-39. Annually ager 58 yrs+  2020 Due this year  Pap tests All women Annually if high risk. Every 2 yrs for normal risk women No  Screening for cervical cancer with   Pap (Z01.419 nl or Z01.411abnl) &  HPV Z11.51 Women aged 52 to 47 Once every 5 yrs No    Screening pelvic and breast exams All women Annually if high risk. Every 2 yrs for normal risk women No    Sexually Transmitted Diseases  Chlamydia  Gonorrhea  Syphilis All at risk adults Annually for non pregnant females at increased risk         Services & Screenings Men Who How Ofter Need  Date of Last Service Action  Prostate Cancer - DRE & PSA Men over 50 Annually.  DRE might require a copay.     Sexually Transmitted Diseases  Syphilis All at risk adults Annually for men at increased risk

## 2019-07-08 ENCOUNTER — Encounter: Payer: Medicare Other | Admitting: Internal Medicine

## 2019-07-14 ENCOUNTER — Other Ambulatory Visit: Payer: Self-pay

## 2019-07-14 ENCOUNTER — Ambulatory Visit: Payer: Medicare Other | Admitting: Physician Assistant

## 2019-07-14 ENCOUNTER — Encounter: Payer: Self-pay | Admitting: Physician Assistant

## 2019-07-14 DIAGNOSIS — L729 Follicular cyst of the skin and subcutaneous tissue, unspecified: Secondary | ICD-10-CM

## 2019-07-14 DIAGNOSIS — L821 Other seborrheic keratosis: Secondary | ICD-10-CM

## 2019-07-14 NOTE — Progress Notes (Signed)
   New Patient Visit  Subjective  Alyssa Haynes is a 74 y.o. female who presents for the following: Cyst (Here for cyst on chest. Getting bigger. ) and Skin Problem (there is spot on patients back that is flakey and she can pull stuff off it. Wants it looked at. ).Cyst on left breast and a mole on the left lower back that changes and flakes and she can pull pieces off of it. The spot on the back sometimes itches but is not sore. The bra may rub at times. The cyst has gotten sore if she messes with it. Nothing has ever come out of the cyst.   Objective  Well appearing patient in no apparent distress; mood and affect are within normal limits.  A focused examination was performed including back and chest. Relevant physical exam findings are noted in the Assessment and Plan. No suspicious moles noted on back.  Objective  Left Breast: Soft cystic nodule left breast.  Objective  Left Lower Back: Stuck-on, waxy plaque.  Assessment & Plan  Cyst of skin Left Breast  Discussed option for excision vs. I&D. Discussed risk of scar and recurrence. Discussed that insurance may not cover the cyst removal as it is a benign lesion. She was given a price and encouraged to call her insurance company to ask about coverage.   Seborrheic keratosis Left Lower Back  Discussed option to freeze. Patient does not want at this time.

## 2019-08-04 ENCOUNTER — Other Ambulatory Visit: Payer: Self-pay

## 2019-08-04 DIAGNOSIS — I10 Essential (primary) hypertension: Secondary | ICD-10-CM

## 2019-08-05 MED ORDER — OLMESARTAN MEDOXOMIL 5 MG PO TABS: 10.0000 mg | ORAL_TABLET | Freq: Every day | ORAL | 3 refills | Status: DC

## 2019-08-06 ENCOUNTER — Other Ambulatory Visit: Payer: Self-pay | Admitting: Internal Medicine

## 2019-08-06 DIAGNOSIS — I10 Essential (primary) hypertension: Secondary | ICD-10-CM

## 2019-09-06 ENCOUNTER — Encounter: Payer: Self-pay | Admitting: Internal Medicine

## 2019-09-08 NOTE — Telephone Encounter (Signed)
Called pt about scheduling an appt - stated problem her legs is not as bad today. And she's going to get a pedicure to see if it will resolve the problem w/her toenails. And she will call back to schedule an appt if these problems continues.

## 2019-09-16 ENCOUNTER — Ambulatory Visit (INDEPENDENT_AMBULATORY_CARE_PROVIDER_SITE_OTHER): Payer: Medicare Other | Admitting: Internal Medicine

## 2019-09-16 ENCOUNTER — Encounter: Payer: Self-pay | Admitting: Internal Medicine

## 2019-09-16 VITALS — BP 135/64 | HR 73 | Temp 98.3°F | Ht 67.0 in | Wt 174.8 lb

## 2019-09-16 DIAGNOSIS — E113299 Type 2 diabetes mellitus with mild nonproliferative diabetic retinopathy without macular edema, unspecified eye: Secondary | ICD-10-CM | POA: Diagnosis not present

## 2019-09-16 DIAGNOSIS — R7401 Elevation of levels of liver transaminase levels: Secondary | ICD-10-CM

## 2019-09-16 DIAGNOSIS — G2581 Restless legs syndrome: Secondary | ICD-10-CM | POA: Diagnosis not present

## 2019-09-16 DIAGNOSIS — G4762 Sleep related leg cramps: Secondary | ICD-10-CM

## 2019-09-16 DIAGNOSIS — Z794 Long term (current) use of insulin: Secondary | ICD-10-CM | POA: Diagnosis not present

## 2019-09-16 LAB — GLUCOSE, CAPILLARY: Glucose-Capillary: 155 mg/dL — ABNORMAL HIGH (ref 70–99)

## 2019-09-16 LAB — POCT GLYCOSYLATED HEMOGLOBIN (HGB A1C): Hemoglobin A1C: 8.6 % — AB (ref 4.0–5.6)

## 2019-09-16 NOTE — Patient Instructions (Signed)
Alyssa Haynes,   It was a pleasure seeing you in clinic. Today we discussed:   Leg cramping: I will obtain some lab work to assess for any abnormalities that may be causing your leg cramping.  I will also send a referral to the podiatrist for you to establish care with a foot doctor.   If you have any questions or concerns, please call our clinic at [phone] between 9am-5pm and after hours call [phone] and ask for the internal medicine resident on call. If you feel you are having a medical emergency please call 911.   Thank you, we look forward to helping you remain healthy!

## 2019-09-17 ENCOUNTER — Encounter: Payer: Self-pay | Admitting: Internal Medicine

## 2019-09-17 LAB — CBC
Hematocrit: 34.8 % (ref 34.0–46.6)
Hemoglobin: 11.9 g/dL (ref 11.1–15.9)
MCH: 30.3 pg (ref 26.6–33.0)
MCHC: 34.2 g/dL (ref 31.5–35.7)
MCV: 89 fL (ref 79–97)
Platelets: 153 10*3/uL (ref 150–450)
RBC: 3.93 x10E6/uL (ref 3.77–5.28)
RDW: 13.1 % (ref 11.7–15.4)
WBC: 3.9 10*3/uL (ref 3.4–10.8)

## 2019-09-17 LAB — CMP14 + ANION GAP
ALT: 67 IU/L — ABNORMAL HIGH (ref 0–32)
AST: 75 IU/L — ABNORMAL HIGH (ref 0–40)
Albumin/Globulin Ratio: 1.5 (ref 1.2–2.2)
Albumin: 4.4 g/dL (ref 3.7–4.7)
Alkaline Phosphatase: 150 IU/L — ABNORMAL HIGH (ref 48–121)
Anion Gap: 16 mmol/L (ref 10.0–18.0)
BUN/Creatinine Ratio: 20 (ref 12–28)
BUN: 14 mg/dL (ref 8–27)
Bilirubin Total: 0.4 mg/dL (ref 0.0–1.2)
CO2: 21 mmol/L (ref 20–29)
Calcium: 9.7 mg/dL (ref 8.7–10.3)
Chloride: 105 mmol/L (ref 96–106)
Creatinine, Ser: 0.71 mg/dL (ref 0.57–1.00)
GFR calc Af Amer: 98 mL/min/{1.73_m2} (ref >59)
GFR calc non Af Amer: 85 mL/min/{1.73_m2} (ref >59)
Globulin, Total: 3 g/dL (ref 1.5–4.5)
Glucose: 163 mg/dL — ABNORMAL HIGH (ref 65–99)
Potassium: 4.1 mmol/L (ref 3.5–5.2)
Sodium: 142 mmol/L (ref 134–144)
Total Protein: 7.4 g/dL (ref 6.0–8.5)

## 2019-09-17 LAB — IRON,TIBC AND FERRITIN PANEL
Ferritin: 20 ng/mL (ref 15–150)
Iron Saturation: 26 % (ref 15–55)
Iron: 96 ug/dL (ref 27–139)
Total Iron Binding Capacity: 374 ug/dL (ref 250–450)
UIBC: 278 ug/dL (ref 118–369)

## 2019-09-17 LAB — TSH: TSH: 3.51 u[IU]/mL (ref 0.450–4.500)

## 2019-09-18 DIAGNOSIS — R7401 Elevation of levels of liver transaminase levels: Secondary | ICD-10-CM | POA: Insufficient documentation

## 2019-09-18 DIAGNOSIS — R252 Cramp and spasm: Secondary | ICD-10-CM | POA: Insufficient documentation

## 2019-09-18 DIAGNOSIS — G2581 Restless legs syndrome: Secondary | ICD-10-CM | POA: Insufficient documentation

## 2019-09-18 MED ORDER — FERROUS SULFATE 325 (65 FE) MG PO TABS: 325.0000 mg | ORAL_TABLET | Freq: Every day | ORAL | 3 refills | Status: DC

## 2019-09-18 NOTE — Assessment & Plan Note (Signed)
Patient with history of elevated LFTs. Further work up including hepatitis labs and iron panel which was negative for hemachromatosis at the time. She was unable to get a liver ultrasound due to financial reasons at the time. Repeat CMP today with continued transaminitis, although improved from prior. Iron panel today not consistent with hemochromatosis. Suspect transaminitis secondary to NAFLD vs asymptomatic hepatitis.   Plan: RUQ [place]

## 2019-09-18 NOTE — Assessment & Plan Note (Signed)
Patient is currently on jardiance 10mg  daily and metformin 500mg  twice daily. She endorses some medication noncompliance in setting of recent family stressors. She notes her blood glucose has been around 250s at home. She has had a 5lb weight gain since her last visit. Discussed medication compliance with dietary changes and exercise. Patient expresses understanding.   Plan - Continue Jardiance 10mg  daily and Metformin 500mg  twice daily (uptitrate as tolerated once medication compliance achieved and or improved glycemic control) - Repeat A1c in 3 months

## 2019-09-18 NOTE — Progress Notes (Signed)
   CC: bilateral leg cramping  HPI:  [name] is a 74 y.o. female with PMHx as listed below presenting for bilateral leg cramping that is worse at night and has been increasing in frequency over the past month. Please see problem based charting for complete assessment and plan.  Past Medical History:  Diagnosis Date  . Anxiety attack 08/22/2017  . Asthma   . Diabetes mellitus   . GERD (gastroesophageal reflux disease)   . Hyperlipidemia   . Hypertension    Review of Systems:  Negative except as stated in HPI.  Physical Exam:  Vitals:   09/16/19 1332  BP: (!) 135/64  Pulse: 73  Temp: 98.3 F (36.8 C)  TempSrc: Oral  SpO2: 98%  Weight: 174 lb 12.8 oz (79.3 kg)  Height: 5\' 7"  (1.702 m)   Physical Exam  Constitutional: Appears well-developed and well-nourished. No distress.  HENT: Normocephalic and atraumatic, EOMI, conjunctiva normal, moist mucous membranes Cardiovascular: Normal rate, regular rhythm, S1 and S2 present, no murmurs, rubs, gallops.  Distal pulses intact Respiratory: No respiratory distress, no accessory muscle use.  Effort is normal.  Lungs are clear to auscultation bilaterally. Musculoskeletal: Normal bulk and tone.  No peripheral edema noted. Neurological: Is alert and oriented x4, strength 5/5 in all extremities; sensation to light touch grossly intact in bilateral lower extremities  Skin: Warm and dry.  No rash, erythema, lesions noted.  Assessment & Plan:   See Encounters Tab for problem based charting.  Patient discussed with Dr. Evette Doffing

## 2019-09-18 NOTE — Telephone Encounter (Signed)
Called Ms. Wiechmann to discuss lab results. Recommended for ferrous supplementation at this time for RLS. Also recommended for medication compliance to improve glycemic control.  Discussed ordering RUQ [place] given persistent elevated LFTs. She expresses understanding.

## 2019-09-18 NOTE — Assessment & Plan Note (Signed)
Ms Alyssa Haynes is presenting for evaluation of bilateral leg cramping that is mostly present at night. She endorses that this has become more bothersome over the past month. She notes awakening from sleep with bilateral leg cramps that are improved with movement. Symptoms consistent with restless leg syndrome. Iron panel with ferritin level of 20.   Plan: Ferrous sulfate 325mg  qHS for goal ferritin >75mg /dl

## 2019-09-19 NOTE — Progress Notes (Signed)
Internal Medicine Clinic Attending ° °Case discussed with Dr. Aslam  At the time of the visit.  We reviewed the resident’s history and exam and pertinent patient test results.  I agree with the assessment, diagnosis, and plan of care documented in the resident’s note.  °

## 2019-09-23 ENCOUNTER — Other Ambulatory Visit: Payer: Self-pay | Admitting: Internal Medicine

## 2019-09-23 DIAGNOSIS — J069 Acute upper respiratory infection, unspecified: Secondary | ICD-10-CM

## 2019-09-26 ENCOUNTER — Encounter: Payer: Self-pay | Admitting: Internal Medicine

## 2019-09-26 DIAGNOSIS — U071 COVID-19: Secondary | ICD-10-CM | POA: Diagnosis not present

## 2019-09-28 ENCOUNTER — Ambulatory Visit (HOSPITAL_COMMUNITY): Payer: Medicare Other

## 2019-09-28 ENCOUNTER — Encounter (HOSPITAL_COMMUNITY): Payer: Self-pay

## 2019-09-29 ENCOUNTER — Other Ambulatory Visit: Payer: Self-pay | Admitting: Infectious Diseases

## 2019-09-29 ENCOUNTER — Encounter: Payer: Self-pay | Admitting: Internal Medicine

## 2019-09-29 ENCOUNTER — Telehealth: Payer: Self-pay | Admitting: Infectious Diseases

## 2019-09-29 DIAGNOSIS — I10 Essential (primary) hypertension: Secondary | ICD-10-CM

## 2019-09-29 DIAGNOSIS — E113299 Type 2 diabetes mellitus with mild nonproliferative diabetic retinopathy without macular edema, unspecified eye: Secondary | ICD-10-CM

## 2019-09-29 DIAGNOSIS — Z794 Long term (current) use of insulin: Secondary | ICD-10-CM

## 2019-09-29 DIAGNOSIS — U071 COVID-19: Secondary | ICD-10-CM

## 2019-09-29 NOTE — Telephone Encounter (Signed)
Called to discuss with patient about Covid symptoms and the use of Regeneron, a monoclonal antibody infusion for those with mild to moderate Covid symptoms and at a high risk of hospitalization.  Pt is qualified for this infusion at the [facility] infusion center due to Age > 49 and Hypertension.    Symptoms started Thursday 8/05 - had some chest congestion today and using Symbicort. Mostly just fatigued and "dragging". She is eating and drinking well. Has no taste or smell.   She was fully vaccinated with both doses.  Her husband has received the monoclonal antibody and recommended to her for consideration.    Alyssa Madeira, MSN, NP-C [facility] for Infectious Disease [email or web address] Pager: [phone] Office: [phone] [place]: [phone]

## 2019-09-29 NOTE — Progress Notes (Signed)
I connected by phone with Alyssa Haynes on 09/29/2019 at 3:43 PM to discuss the potential use of a new treatment for mild to moderate COVID-19 viral infection in non-hospitalized patients.  This patient is a 74 y.o. female that meets the FDA criteria for Emergency Use Authorization of COVID monoclonal antibody casirivimab/imdevimab.  Has a (+) direct SARS-CoV-2 viral test result  Has mild or moderate COVID-19   Is NOT hospitalized due to COVID-19  Is within 10 days of symptom onset  Has at least one of the high risk factor(s) for progression to severe COVID-19 and/or hospitalization as defined in EUA.  Specific high risk criteria : Older age (>/= 72 yo), DM, HTN   I have spoken and communicated the following to the patient or parent/caregiver regarding COVID monoclonal antibody treatment:  1. FDA has authorized the emergency use for the treatment of mild to moderate COVID-19 in adults and pediatric patients with positive results of direct SARS-CoV-2 viral testing who are 69 years of age and older weighing at least 40 kg, and who are at high risk for progressing to severe COVID-19 and/or hospitalization.  2. The significant known and potential risks and benefits of COVID monoclonal antibody, and the extent to which such potential risks and benefits are unknown.  3. Information on available alternative treatments and the risks and benefits of those alternatives, including clinical trials.  4. Patients treated with COVID monoclonal antibody should continue to self-isolate and use infection control measures (e.g., wear mask, isolate, social distance, avoid sharing personal items, clean and disinfect "high touch" surfaces, and frequent handwashing) according to CDC guidelines.   5. The patient or parent/caregiver has the option to accept or refuse COVID monoclonal antibody treatment.  After reviewing this information with the patient, The patient agreed to proceed with receiving  casirivimab\imdevimab infusion and will be provided a copy of the Fact sheet prior to receiving the infusion. [name] 09/29/2019 3:43 PM

## 2019-09-30 MED ORDER — SODIUM CHLORIDE 0.9 % IV SOLN
1200.0000 mg | Freq: Once | INTRAVENOUS | Status: AC
  Administered 2019-10-01: 1200 mg via INTRAVENOUS
  Filled 2019-09-30: qty 1200

## 2019-10-01 ENCOUNTER — Ambulatory Visit (HOSPITAL_COMMUNITY)
Admission: RE | Admit: 2019-10-01 | Discharge: 2019-10-01 | Disposition: A | Discharge status: Home / Self Care | Payer: Medicare Other | Source: Ambulatory Visit | Attending: Pulmonary Disease | Admitting: Pulmonary Disease

## 2019-10-01 DIAGNOSIS — I10 Essential (primary) hypertension: Secondary | ICD-10-CM | POA: Diagnosis present

## 2019-10-01 DIAGNOSIS — Z23 Encounter for immunization: Secondary | ICD-10-CM | POA: Diagnosis not present

## 2019-10-01 DIAGNOSIS — U071 COVID-19: Secondary | ICD-10-CM | POA: Diagnosis present

## 2019-10-01 DIAGNOSIS — Z794 Long term (current) use of insulin: Secondary | ICD-10-CM | POA: Diagnosis present

## 2019-10-01 DIAGNOSIS — E113299 Type 2 diabetes mellitus with mild nonproliferative diabetic retinopathy without macular edema, unspecified eye: Secondary | ICD-10-CM | POA: Insufficient documentation

## 2019-10-01 MED ORDER — SODIUM CHLORIDE 0.9 % IV SOLN: INTRAVENOUS | Status: DC | PRN

## 2019-10-01 MED ORDER — FAMOTIDINE IN NACL 20-0.9 MG/50ML-% IV SOLN: 20.0000 mg | Freq: Once | INTRAVENOUS | Status: DC | PRN

## 2019-10-01 MED ORDER — DIPHENHYDRAMINE HCL 50 MG/ML IJ SOLN: 50.0000 mg | Freq: Once | INTRAMUSCULAR | Status: DC | PRN

## 2019-10-01 MED ORDER — ALBUTEROL SULFATE HFA 108 (90 BASE) MCG/ACT IN AERS: 2.0000 | INHALATION_SPRAY | Freq: Once | RESPIRATORY_TRACT | Status: DC | PRN

## 2019-10-01 MED ORDER — EPINEPHRINE 0.3 MG/0.3ML IJ SOAJ: 0.3000 mg | Freq: Once | INTRAMUSCULAR | Status: DC | PRN

## 2019-10-01 MED ORDER — METHYLPREDNISOLONE SODIUM SUCC 125 MG IJ SOLR: 125.0000 mg | Freq: Once | INTRAMUSCULAR | Status: DC | PRN

## 2019-10-01 NOTE — Discharge Instructions (Signed)

## 2019-10-01 NOTE — Progress Notes (Signed)
  Diagnosis: COVID-19  Physician: Asencion Noble   Procedure: Covid Infusion Clinic Med: casirivimab\imdevimab infusion - Provided patient with casirivimab\imdevimab fact sheet for patients, parents and caregivers prior to infusion.  Complications: No immediate complications noted.  Discharge: Discharged home   Heide Scales 10/01/2019

## 2019-10-04 ENCOUNTER — Encounter: Payer: Self-pay | Admitting: Internal Medicine

## 2019-10-07 ENCOUNTER — Telehealth: Payer: Self-pay

## 2019-10-07 NOTE — Telephone Encounter (Signed)
Agree with this plan.

## 2019-10-07 NOTE — Telephone Encounter (Signed)
Requesting to speak with a nurse about covid test, please call pt back.

## 2019-10-07 NOTE — Telephone Encounter (Signed)
Spoke w/ p, reassured her, she states she and her husband got the vaccine and now they have tested POSITIVE, had the infusion at [facility],  states she didn't feel real bad it is now day 44, her husband is back at work in the prison system and they have a new grandbaby they want to see, what should they do. Suggested waiting until it has been 21 days and then retesting and based on that seeing grandbaby but even then follow COVID care guidelines, masks, wash hands often, do not touch face of self or others even baby. Keep surfaces clean. Drink lots, eat well, rest as much as possible.she expresses she feels much better and she was thinking along these suggestions. Do you agree?

## 2019-10-12 DIAGNOSIS — Z8616 Personal history of COVID-19: Secondary | ICD-10-CM | POA: Diagnosis not present

## 2019-10-15 ENCOUNTER — Other Ambulatory Visit: Payer: Self-pay

## 2019-10-15 ENCOUNTER — Ambulatory Visit (HOSPITAL_COMMUNITY)
Admission: RE | Admit: 2019-10-15 | Discharge: 2019-10-15 | Disposition: A | Discharge status: Home / Self Care | Payer: Medicare Other | Source: Ambulatory Visit | Attending: Student in an Organized Health Care Education/Training Program | Admitting: Student in an Organized Health Care Education/Training Program

## 2019-10-15 DIAGNOSIS — K7460 Unspecified cirrhosis of liver: Secondary | ICD-10-CM | POA: Diagnosis not present

## 2019-10-15 DIAGNOSIS — R7401 Elevation of levels of liver transaminase levels: Secondary | ICD-10-CM | POA: Insufficient documentation

## 2019-10-18 ENCOUNTER — Telehealth: Payer: Self-pay | Admitting: Internal Medicine

## 2019-10-18 NOTE — Telephone Encounter (Signed)
Contact Alyssa Haynes this afternoon to discuss her ultrasound findings.  Patient is concerned about the identification of cirrhosis on results.  We discussed that this is likely a progressive, gradual development of liver disease that is not related to alcohol use or viral hepatitis, as both hepatitis B and C have been ruled out.  I recommended she continue to work on weight loss, healthy diet, and diabetes control, as these do play a role in the development and progression of liver disease, especially if the liver disease is a metabolic associated.  Recommended patient make a office visit to follow-up in regards to results and discuss next steps.

## 2019-10-18 NOTE — Telephone Encounter (Signed)
Pls is requesting a call back regarding results; she is not understanding them [phone]

## 2019-10-18 NOTE — Telephone Encounter (Signed)
I am on inpatient.  Will attempt to call her tomorrow.   Gilles Chiquito, MD

## 2019-10-18 NOTE — Telephone Encounter (Signed)
Pt requesting a call back from her PCP.  Pt states she was called today by [facility] about her Results.  Pt wants to know if Dr. Daryll Drown could give her a call instead. Pt is currently sch with Dr Daryll Drown for 12/10/2019 to f/u.

## 2019-10-18 NOTE — Telephone Encounter (Signed)
Patient contacted.  Thank you.

## 2019-10-19 ENCOUNTER — Encounter: Payer: Self-pay | Admitting: Internal Medicine

## 2019-10-21 ENCOUNTER — Other Ambulatory Visit: Payer: Self-pay | Admitting: *Deleted

## 2019-10-21 ENCOUNTER — Other Ambulatory Visit: Payer: Medicare Other

## 2019-10-21 ENCOUNTER — Other Ambulatory Visit: Payer: Self-pay

## 2019-10-21 DIAGNOSIS — Z0000 Encounter for general adult medical examination without abnormal findings: Secondary | ICD-10-CM

## 2019-10-21 DIAGNOSIS — Z1211 Encounter for screening for malignant neoplasm of colon: Secondary | ICD-10-CM

## 2019-10-21 NOTE — Addendum Note (Signed)
Addended by: Truddie Crumble on: 10/21/2019 11:43 AM   Modules accepted: Orders

## 2019-10-21 NOTE — Addendum Note (Signed)
Addended by: Truddie Crumble on: 10/21/2019 11:53 AM   Modules accepted: Orders

## 2019-10-21 NOTE — Telephone Encounter (Signed)
Called and spoke with Alyssa Haynes.   Reviewed chart - -   Patient with elevated LFTs now since 2018.  Work up has included evaluation for hepatitis (negative), hemachromotosis (negative), thyroid disease (normal).  She had a RUQ [place] which showed cirrhosis.  She has a history of Hyperlipidemia, DM2 and obesity (now overweight and has successfully lost weight) making NAFLD likely.  Her LFTs at most recent check are ~ 2X ULN which is within range for NAFLD.  She does take one supplement which has been associated with liver disease (ashwaghanda) but only for the last 2 months.  She delayed her RUQ [place] due to cost and then was not seen in person for a majority of the [place] pandemic.  She could benefit from further work up including ANA (low liklihood for autoimmune hepatitis, but would change management), Fibrosure to evaluate cirrhosis further, lipid panel update (she is on atorvastatin) and repeat CMET.  She has an appointment in resident clinic on 9/8 and I would like her to get these done at that time.   She further requests a change to her Jardiance.  She is in the "donut hole" and cannot afford.  Will ask Regino Schultze to see if she can discover if there are any SGLT2 medications which are less expensive for her?  If not, will prescribe a sulfonylurea and then change back to jardiance in January.   She is also requesting transitioning her care to a Geriatrician.  She would like to stay in our clinic so I advised that she can start to see Dr. Jimmye Norman.  She is due for a DM follow up and has an appointment with me on 10/22.  She can either keep that appointment, or get a similar one with Dr. Jimmye Norman.   Dr. Gilford Rile - -  When you see Alyssa Haynes on 9/8, please order the following labwork PT/INR Fibrosure Lipid CMET ANA  Thank you!  Please send any questions.

## 2019-10-22 ENCOUNTER — Telehealth: Payer: Self-pay | Admitting: *Deleted

## 2019-10-22 LAB — FECAL OCCULT BLOOD, IMMUNOCHEMICAL: Fecal Occult Bld: POSITIVE — AB

## 2019-10-22 NOTE — Telephone Encounter (Signed)
That would be perfect 

## 2019-10-22 NOTE — Telephone Encounter (Signed)
Left voicemail for return call. Will message patient via Mychart

## 2019-10-22 NOTE — Telephone Encounter (Signed)
We have samples of Jardiance that she can have while we find out from Cheryle Horsfall if she can help her get Jardiance from the  company's patient assistance program.

## 2019-10-22 NOTE — Telephone Encounter (Signed)
Tracie,lab, received a positive fecal occult blood report on this pt. Thanks

## 2019-10-26 ENCOUNTER — Encounter: Payer: Self-pay | Admitting: Internal Medicine

## 2019-10-26 DIAGNOSIS — R195 Other fecal abnormalities: Secondary | ICD-10-CM

## 2019-10-26 NOTE — Telephone Encounter (Signed)
I am going to communicate with her through mychart.  Thank you!

## 2019-10-27 ENCOUNTER — Encounter: Payer: Self-pay | Admitting: Internal Medicine

## 2019-10-27 ENCOUNTER — Other Ambulatory Visit: Payer: Self-pay

## 2019-10-27 ENCOUNTER — Ambulatory Visit (INDEPENDENT_AMBULATORY_CARE_PROVIDER_SITE_OTHER): Payer: Medicare Other | Admitting: Internal Medicine

## 2019-10-27 VITALS — BP 123/62 | HR 67 | Temp 98.1°F | Ht 67.0 in | Wt 175.1 lb

## 2019-10-27 DIAGNOSIS — Z0000 Encounter for general adult medical examination without abnormal findings: Secondary | ICD-10-CM | POA: Diagnosis not present

## 2019-10-27 DIAGNOSIS — K7460 Unspecified cirrhosis of liver: Secondary | ICD-10-CM | POA: Diagnosis not present

## 2019-10-27 DIAGNOSIS — K7589 Other specified inflammatory liver diseases: Secondary | ICD-10-CM | POA: Diagnosis not present

## 2019-10-27 LAB — PROTIME-INR
INR: 1.1 (ref 0.8–1.2)
Prothrombin Time: 14.1 seconds (ref 11.4–15.2)

## 2019-10-27 NOTE — Progress Notes (Signed)
   CC: Cirrhosis   HPI:Ms.Reneisha D Broner is a 74 y.o. female who presents for evaluation of Cirrhosis. Please see individual problem based A/P for details.   Past Medical History:  Diagnosis Date  . Anxiety attack 08/22/2017  . Asthma   . Diabetes mellitus   . GERD (gastroesophageal reflux disease)   . Hyperlipidemia   . Hypertension    Review of Systems:   Review of Systems  Constitutional: Negative for chills and fever.  Cardiovascular: Negative for chest pain and leg swelling.     Physical Exam: Vitals:   10/27/19 1029 10/27/19 1034  BP: (!) 142/72 123/62  Pulse: 78 67  Temp: 98.1 F (36.7 C)   TempSrc: Oral   SpO2: 99%   Weight: 175 lb 1.6 oz (79.4 kg)   Height: 5\' 7"  (1.702 m)      General: NAD, nl appearance HEENT: Normocephalic, atraumatic , antiicteric sclera , no sublingual icterus Cardiovascular: Normal rate, regular rhythm.  No murmurs, rubs, or gallops Pulmonary : Equal breath sounds, No wheezes, rales, or rhonchi Abdominal: soft, nontender,  bowel sounds present   Assessment & Plan:   See Encounters Tab for problem based charting.  Patient discussed with Dr. Dareen Piano

## 2019-10-27 NOTE — Patient Instructions (Addendum)
Thank you for trusting me with your care. To recap, today we discussed the following:  1. Cirrhosis of liver without ascites, unspecified hepatic cirrhosis type (HCC)  - Lipid Profile - CMP14 + Anion Gap - ANA, IFA (with reflex) - Protime-INR - [place] ELASTOGRAPHY LIVER; Future  I will follow up with results.

## 2019-10-27 NOTE — Telephone Encounter (Signed)
The MD portion will be placed in Dr. Doristine Section box on 10/28/19.

## 2019-10-28 ENCOUNTER — Encounter: Payer: Self-pay | Admitting: Dietician

## 2019-10-28 ENCOUNTER — Telehealth: Payer: Self-pay | Admitting: *Deleted

## 2019-10-28 ENCOUNTER — Encounter: Payer: Self-pay | Admitting: Internal Medicine

## 2019-10-28 ENCOUNTER — Ambulatory Visit (INDEPENDENT_AMBULATORY_CARE_PROVIDER_SITE_OTHER): Payer: Medicare Other | Admitting: Dietician

## 2019-10-28 DIAGNOSIS — Z794 Long term (current) use of insulin: Secondary | ICD-10-CM | POA: Diagnosis not present

## 2019-10-28 DIAGNOSIS — E113299 Type 2 diabetes mellitus with mild nonproliferative diabetic retinopathy without macular edema, unspecified eye: Secondary | ICD-10-CM

## 2019-10-28 DIAGNOSIS — K7460 Unspecified cirrhosis of liver: Secondary | ICD-10-CM

## 2019-10-28 LAB — CMP14 + ANION GAP
ALT: 42 IU/L — ABNORMAL HIGH (ref 0–32)
AST: 47 IU/L — ABNORMAL HIGH (ref 0–40)
Albumin/Globulin Ratio: 1.6 (ref 1.2–2.2)
Albumin: 4.5 g/dL (ref 3.7–4.7)
Alkaline Phosphatase: 129 IU/L — ABNORMAL HIGH (ref 48–121)
Anion Gap: 15 mmol/L (ref 10.0–18.0)
BUN/Creatinine Ratio: 19 (ref 12–28)
BUN: 15 mg/dL (ref 8–27)
Bilirubin Total: 0.4 mg/dL (ref 0.0–1.2)
CO2: 21 mmol/L (ref 20–29)
Calcium: 9.7 mg/dL (ref 8.7–10.3)
Chloride: 105 mmol/L (ref 96–106)
Creatinine, Ser: 0.77 mg/dL (ref 0.57–1.00)
GFR calc Af Amer: 89 mL/min/{1.73_m2} (ref >59)
GFR calc non Af Amer: 77 mL/min/{1.73_m2} (ref >59)
Globulin, Total: 2.8 g/dL (ref 1.5–4.5)
Glucose: 106 mg/dL — ABNORMAL HIGH (ref 65–99)
Potassium: 4.4 mmol/L (ref 3.5–5.2)
Sodium: 141 mmol/L (ref 134–144)
Total Protein: 7.3 g/dL (ref 6.0–8.5)

## 2019-10-28 LAB — LIPID PANEL
Chol/HDL Ratio: 2.3 ratio (ref 0.0–4.4)
Cholesterol, Total: 139 mg/dL (ref 100–199)
HDL: 61 mg/dL (ref >39)
LDL Chol Calc (NIH): 60 mg/dL (ref 0–99)
Triglycerides: 96 mg/dL (ref 0–149)
VLDL Cholesterol Cal: 18 mg/dL (ref 5–40)

## 2019-10-28 LAB — ANTINUCLEAR ANTIBODIES, IFA: ANA Titer 1: NEGATIVE

## 2019-10-28 NOTE — Progress Notes (Signed)
Medical Nutrition Therapy Via Telemedicine ( Video Assisted Platform):  Appt start time: 2595 end time:  6387. Total time: 35 Visit # 3  Telephone visit due to COVID-19.  This is a video encounter between Maizy D Amparan  and Debera Lat  on 10/28/2019 for Medical Nutrition Therapy . The visit was conducted with the patient located at home and Debera Lat  at [facility]. The patient's identity was confirmed using their DOB and current address. The patient has consented to being evaluated through a video encounter and understands the associated risks / benefits (allows the patient to remain at home, decreasing exposure to coronavirus). I personally spent 35 minutes on medical nutrition therapy discussion.   Patient and/or legal guardian consented to video/telephone visit: yes  Assessment:  Primary concerns today: supplements.  Ms. Dills has questions about her supplements that were discussed today. She talked to Dr. Daryll Drown and has already stopped the ashwaganda supplement. She hs a history of Type 2 diabetes and hypertension and was recently informed that she has cirrhosis of her liver. She has lost ~ 40# in the past 2 years and seems to be in good spirits. She states that she is being referred to a GI specialist for her liver disease and a geriatrician in our office. Preferred Learning Style: No preference indicated  Learning Readiness: Ready and Change in progress  ANTHROPOMETRICS: Estimated body mass index is 27.42 kg/m as calculated from the following:   Height as of 10/27/19: 5\' 7"  (1.702 m).   Weight as of 10/27/19: 175 lb 1.6 oz (79.4 kg).  WEIGHT HISTORY: Wt Readings from Last 10 Encounters:  10/27/19 175 lb 1.6 oz (79.4 kg)  09/16/19 174 lb 12.8 oz (79.3 kg)  06/02/19 169 lb (76.7 kg)  03/26/19 173 lb (78.5 kg)  10/05/18 181 lb 4.8 oz (82.2 kg)  03/04/18 194 lb 1.6 oz (88 kg)  02/26/18 194 lb 14.4 oz (88.4 kg)  11/05/17 208 lb 1 oz (94.4 kg)  10/08/17 219 lb 3.2 oz (99.4 kg)   09/16/17 217 lb 14.4 oz (98.8 kg)    MEDICATIONS: metformin " for years", vitamin C 1000 mg/day, mag oxide 400 mg daily, 270 mg vit C w/ 15 mg zinc daily,  Mega multivitamin daily that contains 1200 mg vitamin C, 250 mcg biotin, 80 mcg B12, trace amounts of minerals, [place] apple cider vinegar supplement daily, cold pressed black seed oil, elderberry syrup , biotin 1 mg daily  BLOOD SUGAR: Lab Results  Component Value Date   HGBA1C 8.6 (A) 09/16/2019   HGBA1C 8.3 (A) 03/26/2019   HGBA1C 7.9 (A) 03/04/2018   HGBA1C 7.9 (A) 10/08/2017   HGBA1C 7.7 (A) 07/23/2017    DIETARY INTAKE: Usual eating pattern includes  At least 2 meals a day Dining Out (times/week): [place] and other restaurants Salmon, chicken, mixed veggies and quinoa Beverages- drinks green tea   Progress Towards Goal(s):  Some progress.   Nutritional Diagnosis:  NB-1.1 Food and nutrition-related knowledge deficit As related to lack of suffient education on nutrition.  As evidenced by her questions about supplementation.   NB-1.4 Self-monitoring deficit As related to not aware of caloric or nutrient content of foods or blood sugars As evidenced by her report of not reading labels or being aware of what is in the foods she eats and supplements she is taking.     Intervention:  Nutrition education about best diet for liver disease is low in salt and fat with balanced meals/snacks with moderate portions spread out  throughout the day. Education about vitamins and minerals and the safe amounts to take. Action Goal:stop the vitamin C and the vitamin C and zinc supplement  Outcome goal: increased knowledge about supplements, vitamins, minerals and a healthy diet  Coordination of care: none needed  Teaching Method Utilized: Visual, Auditory,Hands on Handouts given during visit include:avs, NIH supplement safety handouts Barriers to learning/adherence to lifestyle change: none Demonstrated degree of understanding via:  Teach Back    Monitoring/Evaluation:  Dietary intake, exercise, meter, and body weight in as needed year(s)  Debera Lat, RD 10/28/2019 11:36 AM. .

## 2019-10-28 NOTE — Assessment & Plan Note (Addendum)
Patient presents today for further workup of etiology of cirrhosis. Dr.Mullen's telephone note outlined workup to date and has been pasted in overview.We will check ANA for autoimmune hepatitis. In addition we will check lipid panel and CMET. We will schedule for fibroscan to better quantify degree of cirrhosis.  Assessment/Plan: Cirrhosis of liver without ascites, unspecified hepatic cirrhosis type ([place]) - most likely etiology is NAFLD given risk factors of HLD, DM2 , and obesity - Lipid Profile - CMP14 + Anion Gap - ANA, IFA (with reflex) - Protime-INR - [place] ELASTOGRAPHY LIVER; Future

## 2019-10-28 NOTE — Telephone Encounter (Signed)
Please call patient.  Called requesting to speak directly to you.  States she got lab results via [place].

## 2019-10-28 NOTE — Patient Instructions (Addendum)
Hello Ms. Handel,   Thank you for your video visit today!   Here are some highlights of what we discussed today:   The recommended amount of Vitamin C a day for women is 75 mg and men it is 90 mg. Most of [place] get this from the fruits and vegetables we eat on a daily basis.   We discussed that you are getting plenty of Vitamin C in your Mega Multivitamin so you could stop the other two supplements you are taking that also contain vitamin C ( the 15 mg zinc with 270 mg vitamin C and the vitamin C 1000 mg).  We also discussed that the best diet for liver disease is low in salt and fat with balanced meals/snacks with moderate portions spread out throughout the day.  What to avoid: Don't eat foods high in fat, sugar and salt. Stay away from a lot of fried foods including fast food restaurant meals. Raw or undercooked shellfish such as oysters and clams are a definite no-no.  Eat a balanced diet: Select foods from all food groups: Grains, fruits, vegetables, meat and beans, milk, and oil.  Eat food with fiber: Fiber helps your liver work at an optimal level. Fruits, vegetables, whole grain breads, rice and cereals can take care of your body's fiber needs.  Drink lots of water: It prevents dehydration and it helps your liver to function better.  I am happy to follow up with you as needed.  Feel free to send me a MyChart message or call anytime!  Butch Penny [phone]

## 2019-10-29 NOTE — Telephone Encounter (Signed)
Called patient and discussed her blood work and continued plan for work up of liver disease and positive FIT test.  All questions answered.  Gilles Chiquito, MD

## 2019-11-01 ENCOUNTER — Encounter: Payer: Self-pay | Admitting: Internal Medicine

## 2019-11-01 NOTE — Progress Notes (Signed)
Internal Medicine Clinic Attending  Case discussed with Dr. Steen  At the time of the visit.  We reviewed the resident's history and exam and pertinent patient test results.  I agree with the assessment, diagnosis, and plan of care documented in the resident's note.  

## 2019-11-02 ENCOUNTER — Encounter: Payer: Self-pay | Admitting: *Deleted

## 2019-11-02 DIAGNOSIS — H524 Presbyopia: Secondary | ICD-10-CM | POA: Diagnosis not present

## 2019-11-02 DIAGNOSIS — H35033 Hypertensive retinopathy, bilateral: Secondary | ICD-10-CM | POA: Diagnosis not present

## 2019-11-02 DIAGNOSIS — H40013 Open angle with borderline findings, low risk, bilateral: Secondary | ICD-10-CM | POA: Diagnosis not present

## 2019-11-02 DIAGNOSIS — E113291 Type 2 diabetes mellitus with mild nonproliferative diabetic retinopathy without macular edema, right eye: Secondary | ICD-10-CM | POA: Diagnosis not present

## 2019-11-02 DIAGNOSIS — H2513 Age-related nuclear cataract, bilateral: Secondary | ICD-10-CM | POA: Diagnosis not present

## 2019-11-02 LAB — HM DIABETES EYE EXAM

## 2019-11-05 ENCOUNTER — Encounter: Payer: Self-pay | Admitting: Gastroenterology

## 2019-11-15 ENCOUNTER — Encounter: Payer: Self-pay | Admitting: Internal Medicine

## 2019-11-21 ENCOUNTER — Encounter: Payer: Self-pay | Admitting: Internal Medicine

## 2019-11-22 ENCOUNTER — Encounter: Payer: Self-pay | Admitting: Internal Medicine

## 2019-11-22 ENCOUNTER — Ambulatory Visit (HOSPITAL_COMMUNITY)
Admission: RE | Admit: 2019-11-22 | Discharge: 2019-11-22 | Disposition: A | Discharge status: Home / Self Care | Payer: Medicare Other | Source: Ambulatory Visit | Attending: Internal Medicine | Admitting: Internal Medicine

## 2019-11-22 ENCOUNTER — Other Ambulatory Visit: Payer: Self-pay

## 2019-11-22 DIAGNOSIS — K7460 Unspecified cirrhosis of liver: Secondary | ICD-10-CM | POA: Diagnosis not present

## 2019-11-24 ENCOUNTER — Telehealth: Payer: Self-pay | Admitting: Internal Medicine

## 2019-11-24 NOTE — Telephone Encounter (Signed)
Called patient to discuss ultrasound results and leg aching from previous messages.   We discussed her [place] results and I answered all questions.   Advised checking vitamin D and B12 at next visit.   Advised she take B complex vitamins for leg aching.   Plan Follow up as planned later this month and we will further discuss.   Gilles Chiquito, MD

## 2019-11-27 ENCOUNTER — Other Ambulatory Visit: Payer: Self-pay | Admitting: Internal Medicine

## 2019-11-27 DIAGNOSIS — Z794 Long term (current) use of insulin: Secondary | ICD-10-CM

## 2019-11-27 DIAGNOSIS — E119 Type 2 diabetes mellitus without complications: Secondary | ICD-10-CM

## 2019-11-29 NOTE — Telephone Encounter (Signed)
Next appt scheduled 10/22 with PCP.

## 2019-12-10 ENCOUNTER — Encounter: Payer: Self-pay | Admitting: Internal Medicine

## 2019-12-10 ENCOUNTER — Other Ambulatory Visit: Payer: Self-pay

## 2019-12-10 ENCOUNTER — Ambulatory Visit (INDEPENDENT_AMBULATORY_CARE_PROVIDER_SITE_OTHER): Payer: Medicare Other | Admitting: Internal Medicine

## 2019-12-10 VITALS — BP 139/68 | HR 71 | Temp 98.3°F | Ht 67.0 in | Wt 174.9 lb

## 2019-12-10 DIAGNOSIS — K7460 Unspecified cirrhosis of liver: Secondary | ICD-10-CM

## 2019-12-10 DIAGNOSIS — M8580 Other specified disorders of bone density and structure, unspecified site: Secondary | ICD-10-CM

## 2019-12-10 DIAGNOSIS — E1169 Type 2 diabetes mellitus with other specified complication: Secondary | ICD-10-CM

## 2019-12-10 DIAGNOSIS — I10 Essential (primary) hypertension: Secondary | ICD-10-CM | POA: Diagnosis not present

## 2019-12-10 DIAGNOSIS — E1159 Type 2 diabetes mellitus with other circulatory complications: Secondary | ICD-10-CM

## 2019-12-10 DIAGNOSIS — E113299 Type 2 diabetes mellitus with mild nonproliferative diabetic retinopathy without macular edema, unspecified eye: Secondary | ICD-10-CM

## 2019-12-10 DIAGNOSIS — Z794 Long term (current) use of insulin: Secondary | ICD-10-CM

## 2019-12-10 DIAGNOSIS — E785 Hyperlipidemia, unspecified: Secondary | ICD-10-CM

## 2019-12-10 DIAGNOSIS — E119 Type 2 diabetes mellitus without complications: Secondary | ICD-10-CM

## 2019-12-10 DIAGNOSIS — I872 Venous insufficiency (chronic) (peripheral): Secondary | ICD-10-CM

## 2019-12-10 DIAGNOSIS — Z23 Encounter for immunization: Secondary | ICD-10-CM

## 2019-12-10 LAB — POCT GLYCOSYLATED HEMOGLOBIN (HGB A1C): Hemoglobin A1C: 7.9 % — AB (ref 4.0–5.6)

## 2019-12-10 LAB — GLUCOSE, CAPILLARY: Glucose-Capillary: 222 mg/dL — ABNORMAL HIGH (ref 70–99)

## 2019-12-10 MED ORDER — METFORMIN HCL 500 MG PO TABS: 500.0000 mg | ORAL_TABLET | Freq: Two times a day (BID) | ORAL | 3 refills | Status: DC

## 2019-12-10 MED ORDER — ATORVASTATIN CALCIUM 20 MG PO TABS: 20.0000 mg | ORAL_TABLET | Freq: Every day | ORAL | 3 refills | Status: DC

## 2019-12-10 MED ORDER — EMPAGLIFLOZIN 10 MG PO TABS: 10.0000 mg | ORAL_TABLET | Freq: Every day | ORAL | 3 refills | Status: DC

## 2019-12-10 NOTE — Progress Notes (Signed)
   Subjective:    Patient ID: Alyssa Haynes, female    DOB: [birth year], 74 y.o.   MRN: [id number]  CC: Follow up of leg pain  HPI  [name] is a 74 year old woman with PMH of DM2, HTN, osteopenia who presents for follow up.   [name] has been having leg cramping.  We had discussed treatment over the phone.  Today she reports that the symptoms largely happen overnight. She notices them when she gets up to urinate, she will have pain in her shins and at the top of her achilles tendon in the back of her leg.  She will stretch and shake them out and they seem to return to normal.  Mustard helps on occasion.  She is taking B vitamins.  She does not have any point tenderness.    Her BP is well controlled.  She is due for A1C today.  Her diabetes has not been as well controlled lately with most recent A1C 8.6 and today is improved to 7.9 which is great!  She is on jardiance and metformin.  She is on an ARB, has not had any renal dysfunction.  She is due for MAU/Cr today.   She is staying active by taking care of her 41 month old grandchild (21st grandchild!)  Review of Systems  Constitutional: Negative for activity change, diaphoresis and fatigue.  Respiratory: Negative for cough and choking.   Cardiovascular: Positive for leg swelling. Negative for chest pain.  Musculoskeletal: Positive for arthralgias and myalgias. Negative for gait problem and joint swelling.  Skin: Negative for color change and rash.  Neurological: Negative for weakness.       Objective:   Physical Exam Vitals and nursing note reviewed.  Constitutional:      General: She is not in acute distress.    Appearance: Normal appearance. She is not toxic-appearing.  HENT:     Head: Normocephalic and atraumatic.  Cardiovascular:     Rate and Rhythm: Normal rate and regular rhythm.     Pulses: Normal pulses.     Heart sounds: No murmur heard.   Pulmonary:     Effort: Pulmonary effort is normal. No respiratory  distress.     Breath sounds: Normal breath sounds. No wheezing.  Musculoskeletal:        General: No swelling, tenderness, deformity or signs of injury.     Right lower leg: No edema.     Left lower leg: No edema.     Comments: She has no pain with palpation of tibial ridge or achilles insertion bilaterally.    Skin:    General: Skin is warm and dry.     Comments: She has thickened toenails, no rash.   Neurological:     Mental Status: She is alert and oriented to person, place, and time. Mental status is at baseline.  Psychiatric:        Mood and Affect: Mood normal.        Behavior: Behavior normal.     MAU/Cr today.       Assessment & Plan:  Follow up in 3 months.

## 2019-12-10 NOTE — Assessment & Plan Note (Signed)
Her A1C is improved to 7.9 today which was very exciting!  She has good blood pressure.  She is on a statin.  MAU/Cr collected today, her renal function is stable and normal on check in September.  She is on an ARB.  She has thickened nails today which I am concerned is a dermatophyte infection.  Given her DM, she would benefit from closer foot care.  Will refer to podiatry.   Plan Continue metformin and jardiance Referral to [place] MAU/Cr today Eye exam showed retinopathy at last check, continue good follow up.

## 2019-12-10 NOTE — Assessment & Plan Note (Signed)
She is doing well today.  We have discussed all of her results.  We will continue to manage her DM and HTN and monitor her liver function every 6 months or so.  Consider follow up ultrasound as well in the future.

## 2019-12-10 NOTE — Assessment & Plan Note (Signed)
No swelling noted on exam today.  She has warm extremities and intact pulses in the lower extremities.  We will continue to monitor.

## 2019-12-10 NOTE — Patient Instructions (Signed)
Congrats, your A1C is down to 7.9!  Keep taking your medications as you are.    For your feet - I have sent a referral to Podiatry.   For your legs - we will check a vitamin D today.   Come back and see me in 3 months.   Thank you!

## 2019-12-10 NOTE — Assessment & Plan Note (Signed)
She has had no broken bones.  She does have some shin pain, will plan to check vitamin D level and repeat a DEXA today.

## 2019-12-10 NOTE — Assessment & Plan Note (Signed)
BP is well controlled today.  She is taking olmesartan without issue and recent blood work has shown a normal K and good renal function.  She denies chest pain, lightheadedness or other issues taking her medications.   Plan Continue olmesartan at current dose.

## 2019-12-11 LAB — MICROALBUMIN / CREATININE URINE RATIO
Creatinine, Urine: 25.2 mg/dL
Microalb/Creat Ratio: <12 mg/g creat (ref 0–29)
Microalbumin, Urine: <3 ug/mL

## 2019-12-13 ENCOUNTER — Encounter: Payer: Self-pay | Admitting: Internal Medicine

## 2019-12-14 ENCOUNTER — Ambulatory Visit
Admission: RE | Admit: 2019-12-14 | Discharge: 2019-12-14 | Disposition: A | Discharge status: Home / Self Care | Payer: Medicare Other | Source: Ambulatory Visit | Attending: Internal Medicine | Admitting: Internal Medicine

## 2019-12-14 ENCOUNTER — Other Ambulatory Visit: Payer: Self-pay

## 2019-12-14 DIAGNOSIS — M8580 Other specified disorders of bone density and structure, unspecified site: Secondary | ICD-10-CM

## 2019-12-14 DIAGNOSIS — M810 Age-related osteoporosis without current pathological fracture: Secondary | ICD-10-CM | POA: Diagnosis not present

## 2019-12-14 DIAGNOSIS — Z780 Asymptomatic menopausal state: Secondary | ICD-10-CM | POA: Diagnosis not present

## 2019-12-14 DIAGNOSIS — M85852 Other specified disorders of bone density and structure, left thigh: Secondary | ICD-10-CM | POA: Diagnosis not present

## 2019-12-17 ENCOUNTER — Other Ambulatory Visit: Payer: Self-pay

## 2019-12-17 ENCOUNTER — Ambulatory Visit (AMBULATORY_SURGERY_CENTER): Payer: Self-pay | Admitting: *Deleted

## 2019-12-17 ENCOUNTER — Encounter: Payer: Self-pay | Admitting: Internal Medicine

## 2019-12-17 VITALS — Ht 65.0 in | Wt 174.6 lb

## 2019-12-17 DIAGNOSIS — R195 Other fecal abnormalities: Secondary | ICD-10-CM

## 2019-12-17 MED ORDER — PLENVU 140 G PO SOLR: 1.0000 | Freq: Once | ORAL | 0 refills | Status: AC

## 2019-12-17 NOTE — Progress Notes (Signed)
Completed covid vaccines 04-19-19  Pt is aware that care partner will wait in the car during procedure; if they feel like they will be too hot or cold to wait in the car; they may wait in the 4 th floor lobby. Patient is aware to bring only one care partner. We want them to wear a mask (we do not have any that we can provide them), practice social distancing, and we will check their temperatures when they get here.  I did remind the patient that their care partner needs to stay in the parking lot the entire time and have a cell phone available, we will call them when the pt is ready for discharge. Patient will wear mask into building.  Pt states "one time only, with my c section- I threw up green bile."   No other issues with other procedures  No difficulty with intubation or hx/fam hx of malignant hyperthermia per pt   No egg or soy allergy  No home oxygen use   No medications for weight loss taken  emmi information given  Pt denies constipation issues  Plenvu coupon for Medicare pts given

## 2019-12-20 ENCOUNTER — Other Ambulatory Visit: Payer: Self-pay | Admitting: Internal Medicine

## 2019-12-21 ENCOUNTER — Encounter: Payer: Self-pay | Admitting: Gastroenterology

## 2019-12-23 ENCOUNTER — Encounter: Payer: Self-pay | Admitting: Dermatology

## 2019-12-23 ENCOUNTER — Ambulatory Visit (INDEPENDENT_AMBULATORY_CARE_PROVIDER_SITE_OTHER): Payer: Medicare Other | Admitting: Dermatology

## 2019-12-23 ENCOUNTER — Other Ambulatory Visit: Payer: Self-pay

## 2019-12-23 DIAGNOSIS — L720 Epidermal cyst: Secondary | ICD-10-CM

## 2019-12-23 NOTE — Patient Instructions (Signed)

## 2019-12-23 NOTE — Progress Notes (Signed)
DAUGHTER WITH PATIENT

## 2019-12-29 ENCOUNTER — Ambulatory Visit: Payer: Medicare Other | Admitting: Podiatry

## 2019-12-29 ENCOUNTER — Encounter: Payer: Self-pay | Admitting: Podiatry

## 2019-12-29 ENCOUNTER — Other Ambulatory Visit: Payer: Self-pay

## 2019-12-29 DIAGNOSIS — M79675 Pain in left toe(s): Secondary | ICD-10-CM

## 2019-12-29 DIAGNOSIS — E113299 Type 2 diabetes mellitus with mild nonproliferative diabetic retinopathy without macular edema, unspecified eye: Secondary | ICD-10-CM

## 2019-12-29 DIAGNOSIS — M2042 Other hammer toe(s) (acquired), left foot: Secondary | ICD-10-CM

## 2019-12-29 DIAGNOSIS — M2041 Other hammer toe(s) (acquired), right foot: Secondary | ICD-10-CM | POA: Diagnosis not present

## 2019-12-29 DIAGNOSIS — Z794 Long term (current) use of insulin: Secondary | ICD-10-CM | POA: Diagnosis not present

## 2019-12-29 DIAGNOSIS — M79674 Pain in right toe(s): Secondary | ICD-10-CM

## 2019-12-29 DIAGNOSIS — B351 Tinea unguium: Secondary | ICD-10-CM | POA: Diagnosis not present

## 2019-12-29 NOTE — Progress Notes (Signed)
  Subjective:  Patient ID: Alyssa Haynes, female    DOB: 10-14-1945,  MRN: [id number]  Chief Complaint  Patient presents with  . Nail Problem    pt states she has bilateral toenail fungus and creates pain  . diabetic foot exam    rfc    74 y.o. female returns for the above complaint.  Patient presents with thickened elongated dystrophic mycotic toenails x10.  Patient states there is pain to it.  Patient is a diabetic with last A1c of 7.9.  She also would like to get diabetic shoes as she has hammertoe contractures 2 through 5 bilaterally that seems to be putting some excessive pressure as well as underlying uncontrolled diabetes.  She denies any other acute complaints.  She has not seen anyone else prior to seeing me.  Objective:  There were no vitals filed for this visit. Podiatric Exam: Vascular: dorsalis pedis and posterior tibial pulses are palpable bilateral. Capillary return is immediate. Temperature gradient is WNL. Skin turgor WNL  Sensorium: Normal Semmes Weinstein monofilament test. Normal tactile sensation bilaterally. Nail Exam: Pt has thick disfigured discolored nails with subungual debris noted bilateral entire nail hallux through fifth toenails.  Pain on palpation to the nails. Ulcer Exam: There is no evidence of ulcer or pre-ulcerative changes or infection. Orthopedic Exam: Muscle tone and strength are WNL. No limitations in general ROM. No crepitus or effusions noted. HAV  B/L.  Hammer toes 2-5  B/L. Skin: No Porokeratosis. No infection or ulcers    Assessment & Plan:   1. Type 2 diabetes mellitus with mild nonproliferative retinopathy without macular edema, with long-term current use of insulin, unspecified laterality (HCC)   2. Pain due to onychomycosis of toenails of both feet   3. Hammertoe, bilateral     Patient was evaluated and treated and all questions answered.  Bilateral hammertoe contracture with underlying uncontrolled diabetes -I explained to patient  the etiology of hammertoe contracture versus treatment options were discussed.  Given the foot deformities that she has patient is a high risk of developing ulceration and therefore I believe she will benefit from diabetic shoes.  I discussed this with the patient extensive detail.  Patient states understanding and would like to obtain diabetic shoes.  Onychomycosis with pain  -Nails palliatively debrided as below. -Educated on self-care  Procedure: Nail Debridement Rationale: pain  Type of Debridement: manual, sharp debridement. Instrumentation: Nail nipper, rotary burr. Number of Nails: 10  Procedures and Treatment: Consent by patient was obtained for treatment procedures. The patient understood the discussion of treatment and procedures well. All questions were answered thoroughly reviewed. Debridement of mycotic and hypertrophic toenails, 1 through 5 bilateral and clearing of subungual debris. No ulceration, no infection noted.  Return Visit-Office Procedure: Patient instructed to return to the office for a follow up visit 3 months for continued evaluation and treatment.  Boneta Lucks, DPM    No follow-ups on file.

## 2019-12-31 ENCOUNTER — Encounter: Payer: Self-pay | Admitting: Gastroenterology

## 2019-12-31 ENCOUNTER — Other Ambulatory Visit: Payer: Self-pay

## 2019-12-31 ENCOUNTER — Ambulatory Visit (AMBULATORY_SURGERY_CENTER): Payer: Medicare Other | Admitting: Gastroenterology

## 2019-12-31 VITALS — BP 144/66 | HR 65 | Temp 97.1°F | Resp 13 | Ht 65.0 in | Wt 174.0 lb

## 2019-12-31 DIAGNOSIS — D123 Benign neoplasm of transverse colon: Secondary | ICD-10-CM | POA: Diagnosis not present

## 2019-12-31 DIAGNOSIS — R195 Other fecal abnormalities: Secondary | ICD-10-CM | POA: Diagnosis not present

## 2019-12-31 DIAGNOSIS — K5730 Diverticulosis of large intestine without perforation or abscess without bleeding: Secondary | ICD-10-CM | POA: Diagnosis not present

## 2019-12-31 MED ORDER — SODIUM CHLORIDE 0.9 % IV SOLN: 500.0000 mL | INTRAVENOUS | Status: DC

## 2019-12-31 NOTE — Op Note (Signed)
[facility] Patient Name: Alyssa Haynes Procedure Date: 12/31/2019 10:35 AM MRN: [id number] Endoscopist: Mallie Mussel L. Loletha Carrow , MD Age: 74 Referring MD:  Date of Birth: [birth year] Gender: Female Account #: [id number] Procedure:                Colonoscopy Indications:              Heme positive stool (done for screening two months                            ago,Hgb 11.9 a month prior, which was a stable                            value for the patient). reported prior screening                            colonoscopy 8-9 yrs prior. Patient denies chronic                            upper or lower GI symptoms. Medicines:                Monitored Anesthesia Care Procedure:                Pre-Anesthesia Assessment:                           - Prior to the procedure, a History and Physical                            was performed, and patient medications and                            allergies were reviewed. The patient's tolerance of                            previous anesthesia was also reviewed. The risks                            and benefits of the procedure and the sedation                            options and risks were discussed with the patient.                            All questions were answered, and informed consent                            was obtained. Prior Anticoagulants: The patient has                            taken no previous anticoagulant or antiplatelet                            agents. ASA Grade Assessment: II - A patient with  mild systemic disease. After reviewing the risks                            and benefits, the patient was deemed in                            satisfactory condition to undergo the procedure.                           After obtaining informed consent, the colonoscope                            was passed under direct vision. Throughout the                            procedure, the patient's blood  pressure, pulse, and                            oxygen saturations were monitored continuously. The                            Colonoscope was introduced through the anus and                            advanced to the the cecum, identified by                            appendiceal orifice and ileocecal valve. The                            colonoscopy was performed without difficulty. The                            patient tolerated the procedure well. The quality                            of the bowel preparation was excellent. The                            ileocecal valve, appendiceal orifice, and rectum                            were photographed. Scope In: 10:59:48 AM Scope Out: 11:12:02 AM Scope Withdrawal Time: 0 hours 8 minutes 41 seconds  Total Procedure Duration: 0 hours 12 minutes 14 seconds  Findings:                 The perianal and digital rectal examinations were                            normal.                           Multiple small-mouthed diverticula were found in  the left colon and right colon.                           A diminutive polyp was found in the transverse                            colon. The polyp was sessile. The polyp was removed                            with a cold snare. Resection and retrieval were                            complete.                           The exam was otherwise without abnormality on                            direct and retroflexion views. Complications:            No immediate complications. Estimated Blood Loss:     Estimated blood loss was minimal. Impression:               - Diverticulosis in the left colon and in the right                            colon.                           - One diminutive polyp in the transverse colon,                            removed with a cold snare. Resected and retrieved.                           - The examination was otherwise normal on direct                             and retroflexion views.                           Apparent false positive stool test. Recommendation:           - Patient has a contact number available for                            emergencies. The signs and symptoms of potential                            delayed complications were discussed with the                            patient. Return to normal activities tomorrow.                            Written discharge instructions were provided to  the                            patient.                           - Resume previous diet.                           - Continue present medications.                           - Await pathology results.                           - Based on current guidelines, no repeat                            surveillance colonoscopy recommended. No future                            routine stool FOBT test recommended.                           - Return to primary care physician as previously                            scheduled. Eagan Shifflett L. Loletha Carrow, MD 12/31/2019 11:21:34 AM This report has been signed electronically.

## 2019-12-31 NOTE — Progress Notes (Signed)
Report to PACU, RN, vss, BBS= Clear.  

## 2019-12-31 NOTE — Progress Notes (Signed)
V/s HC I have reviewed the patient's medical history in detail and updated the computerized patient record.

## 2019-12-31 NOTE — Patient Instructions (Signed)
Handouts Provided:  Diverticulosis and Polyps  YOU HAD AN ENDOSCOPIC PROCEDURE TODAY AT [facility]:   Refer to the procedure report that was given to you for any specific questions about what was found during the examination.  If the procedure report does not answer your questions, please call your gastroenterologist to clarify.  If you requested that your care partner not be given the details of your procedure findings, then the procedure report has been included in a sealed envelope for you to review at your convenience later.  YOU SHOULD EXPECT: Some feelings of bloating in the abdomen. Passage of more gas than usual.  Walking can help get rid of the air that was put into your GI tract during the procedure and reduce the bloating. If you had a lower endoscopy (such as a colonoscopy or flexible sigmoidoscopy) you may notice spotting of blood in your stool or on the toilet paper. If you underwent a bowel prep for your procedure, you may not have a normal bowel movement for a few days.  Please Note:  You might notice some irritation and congestion in your nose or some drainage.  This is from the oxygen used during your procedure.  There is no need for concern and it should clear up in a day or so.  SYMPTOMS TO REPORT IMMEDIATELY:   Following lower endoscopy (colonoscopy or flexible sigmoidoscopy):  Excessive amounts of blood in the stool  Significant tenderness or worsening of abdominal pains  Swelling of the abdomen that is new, acute  Fever of 100F or higher  For urgent or emergent issues, a gastroenterologist can be reached at any hour by calling [phone]. Do not use MyChart messaging for urgent concerns.    DIET:  We do recommend a small meal at first, but then you may proceed to your regular diet.  Drink plenty of fluids but you should avoid alcoholic beverages for 24 hours.  ACTIVITY:  You should plan to take it easy for the rest of today and you should NOT DRIVE  or use heavy machinery until tomorrow (because of the sedation medicines used during the test).    FOLLOW UP: Our staff will call the number listed on your records 48-72 hours following your procedure to check on you and address any questions or concerns that you may have regarding the information given to you following your procedure. If we do not reach you, we will leave a message.  We will attempt to reach you two times.  During this call, we will ask if you have developed any symptoms of COVID 19. If you develop any symptoms (ie: fever, flu-like symptoms, shortness of breath, cough etc.) before then, please call [phone].  If you test positive for Covid 19 in the 2 weeks post procedure, please call and report this information to [place].    If any biopsies were taken you will be contacted by phone or by letter within the next 1-3 weeks.  Please call us at [phone] if you have not heard about the biopsies in 3 weeks.    SIGNATURES/CONFIDENTIALITY: You and/or your care partner have signed paperwork which will be entered into your electronic medical record.  These signatures attest to the fact that that the information above on your After Visit Summary has been reviewed and is understood.  Full responsibility of the confidentiality of this discharge information lies with you and/or your care-partner.

## 2019-12-31 NOTE — Progress Notes (Signed)
Vs CW I have reviewed the patient's medical history in detail and updated the computerized patient record.   

## 2020-01-04 ENCOUNTER — Telehealth: Payer: Self-pay | Admitting: *Deleted
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
## 2020-01-04 NOTE — Telephone Encounter (Signed)
  Follow up Call-  Call back number 12/31/2019  Post procedure Call Back phone  # [phone]  Permission to leave phone message Yes  Some recent data might be hidden     Patient questions:  Do you have a fever, pain , or abdominal swelling? No. Pain Score  0 *  Have you tolerated food without any problems? Yes.    Have you been able to return to your normal activities? Yes.    Do you have any questions about your discharge instructions: Diet   No. Medications  No. Follow up visit  No.  Do you have questions or concerns about your Care? No.  Actions: * If pain score is 4 or above: No action needed, pain <4.  1. Have you developed a fever since your procedure? no  2.   Have you had an respiratory symptoms (SOB or cough) since your procedure? no  3.   Have you tested positive for COVID 19 since your procedure no  4.   Have you had any family members/close contacts diagnosed with the COVID 19 since your procedure?  no   If yes to any of these questions please route to Joylene John, RN and Joella Prince, RN

## 2020-01-06 ENCOUNTER — Ambulatory Visit (INDEPENDENT_AMBULATORY_CARE_PROVIDER_SITE_OTHER): Payer: Medicare Other | Admitting: *Deleted

## 2020-01-06 ENCOUNTER — Encounter: Payer: Self-pay | Admitting: Gastroenterology

## 2020-01-06 ENCOUNTER — Other Ambulatory Visit: Payer: Self-pay

## 2020-01-06 DIAGNOSIS — L729 Follicular cyst of the skin and subcutaneous tissue, unspecified: Secondary | ICD-10-CM

## 2020-01-06 NOTE — Progress Notes (Signed)
Here for suture removal x 3.  No sign or symptom of infection. Path to patient.

## 2020-01-07 ENCOUNTER — Ambulatory Visit (INDEPENDENT_AMBULATORY_CARE_PROVIDER_SITE_OTHER): Payer: Medicare Other | Admitting: Orthotics

## 2020-01-07 DIAGNOSIS — E113299 Type 2 diabetes mellitus with mild nonproliferative diabetic retinopathy without macular edema, unspecified eye: Secondary | ICD-10-CM

## 2020-01-07 DIAGNOSIS — Z794 Long term (current) use of insulin: Secondary | ICD-10-CM

## 2020-01-13 ENCOUNTER — Encounter: Payer: Self-pay | Admitting: Dermatology

## 2020-01-13 NOTE — Progress Notes (Signed)
   Follow-Up Visit   Subjective  Alyssa Haynes is a 74 y.o. female who presents for the following: Cyst (ON CHEST).  Cyst Location: Abdomen Duration:  Quality:  Associated Signs/Symptoms: Modifying Factors:  Severity:  Timing: Context: Patient requests removal  Objective  Well appearing patient in no apparent distress; mood and affect are within normal limits.  A focused examination was performed including Abdomen. Relevant physical exam findings are noted in the Assessment and Plan.   Assessment & Plan    Epidermal cyst Chest - Medial (Center)  Skin excision - Chest - Medial (Center)  Lesion length (cm):  1.3 Lesion width (cm):  1 Margin per side (cm):  0 Total excision diameter (cm):  1.3 Informed consent: discussed and consent obtained   Anesthesia: the lesion was anesthetized in a standard fashion   Anesthetic:  1% lidocaine w/ epinephrine 1-100,000 local infiltration Instrument used: #15 blade   Hemostasis achieved with: suture and electrodesiccation   Outcome: patient tolerated procedure well with no complications   Post-procedure details: sterile dressing applied and wound care instructions given   Dressing type: bandage, petrolatum and pressure dressing   Additional details:  NYLON LOOK 4-0 X 3   Specimen 1 - Surgical pathology Differential Diagnosis:CYST Check Margins: No      I, Lavonna Monarch, MD, have reviewed all documentation for this visit.  The documentation on 01/13/20 for the exam, diagnosis, procedures, and orders are all accurate and complete.

## 2020-01-15 ENCOUNTER — Other Ambulatory Visit: Payer: Self-pay | Admitting: Internal Medicine

## 2020-01-18 NOTE — Telephone Encounter (Signed)
Has taken for panic attacks in the past.  Will refill.  PDMP appropriate for very limited refills.

## 2020-01-27 ENCOUNTER — Encounter: Payer: Self-pay | Admitting: Internal Medicine

## 2020-01-28 ENCOUNTER — Encounter: Payer: Self-pay | Admitting: Internal Medicine

## 2020-02-01 ENCOUNTER — Ambulatory Visit (INDEPENDENT_AMBULATORY_CARE_PROVIDER_SITE_OTHER): Payer: Medicare Other | Admitting: Pharmacist

## 2020-02-01 ENCOUNTER — Other Ambulatory Visit: Payer: Self-pay

## 2020-02-01 DIAGNOSIS — E113299 Type 2 diabetes mellitus with mild nonproliferative diabetic retinopathy without macular edema, unspecified eye: Secondary | ICD-10-CM

## 2020-02-01 DIAGNOSIS — Z794 Long term (current) use of insulin: Secondary | ICD-10-CM

## 2020-02-01 NOTE — Patient Instructions (Signed)
It was nice meeting you today!   Please continue to take your medications and check your blood sugars.  If you have any questions please call the clinic.

## 2020-02-01 NOTE — Progress Notes (Signed)
Alyssa Haynes presents for empagliflozin (jardiance) 10mg  samples and to review her blood glucose readings.  Patient last saw her PCP, Dr. Daryll Drown, on 12/10/2019.   Patient states that lately her sugars have been "a little high." She mentions usually she eats exactly how she is supposed to and her sugars are down, but during the holiday season it is harder to do so.   She forgot her meter today but checks her FBG and the range has been 130-289, with most of her readings in the 100's vs the 200's.   She denies any signs of hypoglycemia and hyperglycemia, besides polyuria, which she states is not abnormal. She mentions that she typically has to pee every two hours and during the night she wears a pad. We discussed how this could be a sign of high blood glucose as well as the empagliflozin (jardiance). Patient stated she will check her blood glucose on occasion during episodes of increased urination to see if her blood glucose is high.   Current diabetes medications: empagliflozin (jardiance) 10 mg, metformin 500mg  BID  Medication Samples have been provided to the patient.  Drug name: empagliflozin (jardiance)       Strength: 10mg         Qty: 21 tablets        LOT: [id number]    Exp.Date: 08/23  Dosing instructions: take one tablet by mouth daily before breakfast  The patient has been instructed regarding the correct time, dose, and frequency of taking this medication, including desired effects and most common side effects.   Hughes Better 10:00 AM 02/01/2020

## 2020-02-02 NOTE — Addendum Note (Signed)
Addended by: Gilles Chiquito B on: 02/02/2020 02:49 PM   Modules accepted: Orders

## 2020-02-02 NOTE — Progress Notes (Signed)
I have reviewed Dr. [name] note and discussed the care of this patient with her in person.  I agree with sample for Jardiance.  Will see [name] at next visit.

## 2020-02-08 DIAGNOSIS — M545 Low back pain, unspecified: Secondary | ICD-10-CM | POA: Diagnosis not present

## 2020-02-08 DIAGNOSIS — M25562 Pain in left knee: Secondary | ICD-10-CM | POA: Diagnosis not present

## 2020-03-08 DIAGNOSIS — Z20822 Contact with and (suspected) exposure to covid-19: Secondary | ICD-10-CM | POA: Diagnosis not present

## 2020-03-14 ENCOUNTER — Other Ambulatory Visit: Payer: Self-pay

## 2020-03-14 ENCOUNTER — Ambulatory Visit: Payer: Medicare Other | Admitting: Orthotics

## 2020-03-14 DIAGNOSIS — M2042 Other hammer toe(s) (acquired), left foot: Secondary | ICD-10-CM | POA: Diagnosis not present

## 2020-03-14 DIAGNOSIS — E1140 Type 2 diabetes mellitus with diabetic neuropathy, unspecified: Secondary | ICD-10-CM | POA: Diagnosis not present

## 2020-03-14 DIAGNOSIS — M2041 Other hammer toe(s) (acquired), right foot: Secondary | ICD-10-CM | POA: Diagnosis not present

## 2020-03-14 NOTE — Progress Notes (Signed)
ff

## 2020-03-30 ENCOUNTER — Encounter: Payer: Self-pay | Admitting: Internal Medicine

## 2020-03-31 ENCOUNTER — Ambulatory Visit: Payer: Medicare Other | Admitting: Podiatry

## 2020-03-31 MED ORDER — GUAIFENESIN-CODEINE 100-10 MG/5ML PO SOLN
5.0000 mL | Freq: Three times a day (TID) | ORAL | 0 refills | Status: DC | PRN
Start: 2020-03-31 — End: 2020-10-26

## 2020-04-07 ENCOUNTER — Ambulatory Visit: Payer: Medicare Other | Admitting: Podiatry

## 2020-04-18 ENCOUNTER — Encounter: Payer: Self-pay | Admitting: Student

## 2020-04-18 ENCOUNTER — Ambulatory Visit (INDEPENDENT_AMBULATORY_CARE_PROVIDER_SITE_OTHER): Payer: Medicare Other | Admitting: Student

## 2020-04-18 ENCOUNTER — Other Ambulatory Visit: Payer: Self-pay

## 2020-04-18 DIAGNOSIS — J40 Bronchitis, not specified as acute or chronic: Secondary | ICD-10-CM | POA: Diagnosis not present

## 2020-04-18 MED ORDER — PREDNISONE 5 MG PO TABS: 40.0000 mg | ORAL_TABLET | Freq: Every day | ORAL | 0 refills | Status: DC

## 2020-04-18 MED ORDER — AZITHROMYCIN 250 MG PO TABS: ORAL_TABLET | ORAL | 0 refills | Status: DC

## 2020-04-18 NOTE — Assessment & Plan Note (Signed)
Patient endorses persistent coughing with mucus.  Patient states that before Covid, she would have bronchitis twice a year, usually in the fall season, last 2-3 weeks.  This time, she has persistent productive cough and congestion that has been going on for 4 weeks.  She is taking Mucinex and Robitussin with minimal relief.  She tested negative for Covid.  She is vaccinated and boosted for Covid.  She denies fever, shortness of breath, sore throat, change in taste/smell, sick contact, acid reflux.  She endorses sinus drainage, thick yellow mucus.  She typically uses her rescue inhaler once at night, but currently using her inhaler more often.  In her last bronchitis, she was typically prescribed Z-Pak and prednisone which helped speed of the recovery process.  Assessment and plan Her history and symptom is consistent with a acute bronchitis episode: Increased productive cough and congestion.  This does not seems like a bad infection.  Covid test was negative.  Will prescribe Z-Pak and prednisone 40 mg for 5 days.  Patient will call back and let us know about her improvement. -Z-Pak for 5 days -Prednisone 40 mg for 5 days

## 2020-04-18 NOTE — Progress Notes (Signed)
  [facility] Internal Medicine Residency Telephone Encounter Continuity Care Appointment  HPI:   This telephone encounter was created for Ms. Rene D Klaus on 04/18/2020 for the following purpose/cc persistent productive cough.   Past Medical History:  Past Medical History:  Diagnosis Date  . Anxiety attack 08/22/2017  . Arthritis   . Asthma   . Cataract   . Cirrhosis ([place])   . Diabetes mellitus   . GERD (gastroesophageal reflux disease)   . Hyperlipidemia   . Hypertension   . Osteoporosis       ROS:   Negative for fever, shortness of breath, sore throat, change of taste or smell, sick contact, acid reflux.  Positive for sinus drainage, productive cough, thick yellow mucus   Assessment / Plan / Recommendations:   Please see A&P under problem oriented charting for assessment of the patient's acute and chronic medical conditions.   As always, pt is advised that if symptoms worsen or new symptoms arise, they should go to an urgent care facility or to to ER for further evaluation.   Consent and Medical Decision Making:   Patient discussed with Dr. Jimmye Norman  This is a telephone encounter between Londin D Joshi and Gaylan Gerold on 04/18/2020 for 9 minutes. The visit was conducted with the patient located at home and Gaylan Gerold at [facility]. The patient's identity was confirmed using their DOB and current address. The patient has consented to being evaluated through a telephone encounter and understands the associated risks (an examination cannot be done and the patient may need to come in for an appointment) / benefits (allows the patient to remain at home, decreasing exposure to coronavirus). I personally spent 9 minutes on medical discussion.

## 2020-04-19 NOTE — Progress Notes (Signed)
Internal Medicine Clinic Attending  Case discussed with Dr. Alfonse Spruce  at the time of the telehealth visit.  We reviewed the resident's history and exam and pertinent patient test results.  I agree with the assessment, diagnosis, and plan of care documented in the resident's note. Pt. Has not improved with appropriate symptomatic care and treating for an acute bronchitis exacerbation is reasonable.

## 2020-04-20 ENCOUNTER — Telehealth: Payer: Self-pay | Admitting: *Deleted

## 2020-04-20 DIAGNOSIS — J40 Bronchitis, not specified as acute or chronic: Secondary | ICD-10-CM

## 2020-04-20 MED ORDER — PREDNISONE 5 MG PO TABS
40.0000 mg | ORAL_TABLET | Freq: Every day | ORAL | 0 refills | Status: AC
Start: 2020-04-20 — End: 2020-04-25

## 2020-04-20 MED ORDER — AZITHROMYCIN 250 MG PO TABS: ORAL_TABLET | ORAL | 0 refills | Status: AC

## 2020-04-20 NOTE — Telephone Encounter (Signed)
Done

## 2020-04-20 NOTE — Telephone Encounter (Signed)
Patient called in stating she is in [facility] and forgot to bring the erythromycin and prednisone with her. She is not returning to [place] till Sunday, 04/23/2020. She is requesting enough tabs of both meds be sent to [place] at [place]. [place] in [place] to get her to Sunday.

## 2020-04-27 ENCOUNTER — Telehealth: Payer: Self-pay

## 2020-04-27 NOTE — Telephone Encounter (Signed)
Pt is requesting a call back about her medication and if she she still take it  For her cough

## 2020-04-27 NOTE — Telephone Encounter (Signed)
RTC, patient states she finished her 5 day course of prednisone and zpack and did get relief from the medication.  However, she now has started with the persistent, productive cough and congestion again.  She is asking if she should take another cycle of the prednisone and zpack?  Pt states she had a negative Covid test before starting treatment.  RN advised patient since her symptoms have returned, it is recommended she come in for evaluation.  Appt offered today, she declined because she is keeping her [place].  Appt made for tomorrow morning at 0945 with Dr. Alfonse Spruce. SChaplin, RN,BSN

## 2020-04-27 NOTE — Telephone Encounter (Signed)
Thank you for the update!

## 2020-04-28 ENCOUNTER — Encounter: Payer: Medicare Other | Admitting: Student

## 2020-05-04 ENCOUNTER — Encounter: Payer: Self-pay | Admitting: Student

## 2020-05-04 ENCOUNTER — Other Ambulatory Visit: Payer: Self-pay

## 2020-05-04 ENCOUNTER — Ambulatory Visit (INDEPENDENT_AMBULATORY_CARE_PROVIDER_SITE_OTHER): Payer: Medicare Other | Admitting: Student

## 2020-05-04 DIAGNOSIS — J40 Bronchitis, not specified as acute or chronic: Secondary | ICD-10-CM | POA: Diagnosis not present

## 2020-05-04 NOTE — Progress Notes (Signed)
   CC: chronic cough  HPI:  [name] is a 75 y.o. with history as listed below presenting to clinic today for chronic cough.  Please see problem-based list for further details, assessments, and plans.  Past Medical History:  Diagnosis Date  . Anxiety attack 08/22/2017  . Arthritis   . Asthma   . Cataract   . Cirrhosis ([place])   . Diabetes mellitus   . GERD (gastroesophageal reflux disease)   . Hyperlipidemia   . Hypertension   . Osteoporosis    Review of Systems:  As per HPI  Physical Exam:  Vitals:   05/04/20 1554  BP: (!) 157/83  Pulse: 87  Temp: 99 F (37.2 C)  TempSrc: Oral  SpO2: 98%  Weight: 171 lb 9.6 oz (77.8 kg)  Height: 5\' 6"  (1.676 m)   General: Pleasant, sitting comfortably in chair in no acute distress CV: Regular rate, rhythm. No m/r/g Pulm: Normal work of breathing, no use accessory muscles. Clear to auscultation bilaterally. No wheezing, rales, rhonchi appreciated. Neuro: Awake, alert, moving extremities appropriately.   Assessment & Plan:   See Encounters Tab for problem based charting.  Patient discussed with Dr. Rebeca Alert

## 2020-05-04 NOTE — Patient Instructions (Signed)
Alyssa Haynes,  It was a pleasure seeing you today!  Today we discussed the bronchitis you've been experiencing. Go ahead and finish the prednisone and azithromycin that you have been prescribed. In addition, you can take your albuterol inhaler every four hour as needed for the cough. Return to clinic if your symptoms do not improve.  We look forward to seeing you next time. Please call our clinic at [phone] if you have any questions or concerns. The best time to call is Monday-Friday from 9am-4pm, but there is someone available 24/7 at the same number. If you need medication refills, please notify your pharmacy one week in advance and they will send [place] a request.  Thank you for letting us take part in your care. Wishing you the best!  Thank you, Dr. Sanjuan Dame, MD

## 2020-05-05 NOTE — Assessment & Plan Note (Signed)
Patient presenting to clinic today for further evaluation of cough. She had an appointment with Dr. Alfonse Spruce earlier this month and was prescribed 5d steroids and antibiotics. Patient reports she was only able to complete a couple days of these medications prior to her vacation. She accidentally left these medications at home and had her PCP, Dr. Daryll Drown, [place]. She only had one more dose of each before stopping them again due to improvement of symptoms. Since then, her symptoms have returned. She endorses productive cough with thick mucous. Denies fevers, chills, rhinorrhea, sore throat, sinus pain, otorrhea, ear pain, dyspnea, wheezing, loss of smell/taste. She mentions a couple of her grandchildren (she has 22 total) have had intermittent cough but not overtly sick.   A/P: History consistent with bronchitis. Will have patient finish the rest of the steroids and antibiotics (she has 6-7 days left). Also encouraged her to use her albuterol inhaler every four hours as needed to help reduce the cough. Patient instructed to return to clinic if symptoms do not improve with these interventions. - Finish prednisone 40mg , azithromycin as prescribed - Albuterol 1 puff q4h PRN for cough - RTC if symptoms fail to improve

## 2020-05-07 NOTE — Progress Notes (Signed)
Internal Medicine Clinic Attending  Case discussed with Dr. Braswell at the time of the visit.  We reviewed the resident's history and exam and pertinent patient test results.  I agree with the assessment, diagnosis, and plan of care documented in the resident's note.  Alexander Raines, M.D., Ph.D.  

## 2020-05-20 DIAGNOSIS — J45909 Unspecified asthma, uncomplicated: Secondary | ICD-10-CM | POA: Diagnosis not present

## 2020-05-20 DIAGNOSIS — R059 Cough, unspecified: Secondary | ICD-10-CM | POA: Diagnosis not present

## 2020-05-23 ENCOUNTER — Telehealth: Payer: Self-pay | Admitting: Internal Medicine

## 2020-05-23 NOTE — Telephone Encounter (Signed)
Called / informed pt of Dr Doristine Section response to take another course of abx if symptoms do not improved. And to call if she has not improvement or return of symptoms; may need cxr - she voiced understanding.

## 2020-05-23 NOTE — Telephone Encounter (Signed)
Pt seen @ [facility] for a recent Covid Test on 05/20/2020 and all test can back negative.  Patient still not feeling well, having congestion, stopped up nose, slight cough and has some questions about medications given to here.  Please call hte patient back.

## 2020-05-23 NOTE — Telephone Encounter (Signed)
Stated she went [employer] - covid test negative. So the doctor order a z-pak, prednisone, erythromycin and a cough med. Stated she has not started taking any of these meds yet; she was wondering if it's too soon from the last time ( see ov 05/04/20)? Stated she feels better for a few days after taking the medications then she starts wheezing, and congestion returns. Thanks

## 2020-05-23 NOTE — Telephone Encounter (Signed)
It is okay to do another course if symptoms not improved.  If continues to be an issue, she will need to come in and possibly have a chest xray if not done.

## 2020-06-09 DIAGNOSIS — H40013 Open angle with borderline findings, low risk, bilateral: Secondary | ICD-10-CM | POA: Diagnosis not present

## 2020-06-09 LAB — HM DIABETES EYE EXAM

## 2020-06-12 ENCOUNTER — Encounter: Payer: Self-pay | Admitting: *Deleted

## 2020-06-26 ENCOUNTER — Other Ambulatory Visit: Payer: Self-pay

## 2020-06-26 ENCOUNTER — Telehealth: Payer: Self-pay | Admitting: Internal Medicine

## 2020-06-26 ENCOUNTER — Ambulatory Visit (HOSPITAL_COMMUNITY)
Admission: RE | Admit: 2020-06-26 | Discharge: 2020-06-26 | Disposition: A | Discharge status: Home / Self Care | Payer: Medicare Other | Source: Ambulatory Visit | Attending: Internal Medicine | Admitting: Internal Medicine

## 2020-06-26 DIAGNOSIS — R059 Cough, unspecified: Secondary | ICD-10-CM

## 2020-06-26 NOTE — Telephone Encounter (Signed)
RTC, patient states she will take a z-pack and her cough/congestion clears up for 2-3 weeks, but then comes back.  She now has a productive cough again with thick, yellow sputum. Congestion in her chest, slight SOB which she states she has needed to increase the use of her rescue inhaler from QHS to BID and some wheezing (No wheezing noted during conversation with RN). Per Dr. Doristine Section last phone note, if s/s restarted, patient is to come in for evaluation and possible chest XRAY.  No current openings available.  Will talk to attendings. SChaplin, RN,BSN

## 2020-06-26 NOTE — Telephone Encounter (Signed)
Call to patient to inform her on that MD has placed order for CXR.  Pt unfamiliar with [place] location and requested CXR be done at [facility].  Location changed to [facility] also given f/u appt with [facility] on 05/11 @1015 

## 2020-06-26 NOTE — Telephone Encounter (Signed)
Pt calling to report her cough and congestion is no better X 1 month and was told to call back to have a chest X-ray ordered.   Please call patient back

## 2020-06-26 NOTE — Addendum Note (Signed)
Addended by: Marcelino Duster on: 06/26/2020 10:27 AM   Modules accepted: Orders

## 2020-06-28 ENCOUNTER — Other Ambulatory Visit: Payer: Self-pay

## 2020-06-28 ENCOUNTER — Encounter: Payer: Self-pay | Admitting: Student

## 2020-06-28 ENCOUNTER — Ambulatory Visit (INDEPENDENT_AMBULATORY_CARE_PROVIDER_SITE_OTHER): Payer: Medicare Other | Admitting: Student

## 2020-06-28 VITALS — BP 150/75 | HR 75 | Temp 98.2°F | Ht 66.0 in | Wt 170.8 lb

## 2020-06-28 DIAGNOSIS — E1100 Type 2 diabetes mellitus with hyperosmolarity without nonketotic hyperglycemic-hyperosmolar coma (NKHHC): Secondary | ICD-10-CM | POA: Diagnosis not present

## 2020-06-28 DIAGNOSIS — G2581 Restless legs syndrome: Secondary | ICD-10-CM

## 2020-06-28 DIAGNOSIS — E113299 Type 2 diabetes mellitus with mild nonproliferative diabetic retinopathy without macular edema, unspecified eye: Secondary | ICD-10-CM

## 2020-06-28 DIAGNOSIS — I10 Essential (primary) hypertension: Secondary | ICD-10-CM

## 2020-06-28 DIAGNOSIS — J40 Bronchitis, not specified as acute or chronic: Secondary | ICD-10-CM

## 2020-06-28 DIAGNOSIS — J45909 Unspecified asthma, uncomplicated: Secondary | ICD-10-CM

## 2020-06-28 DIAGNOSIS — Z0000 Encounter for general adult medical examination without abnormal findings: Secondary | ICD-10-CM

## 2020-06-28 DIAGNOSIS — Z794 Long term (current) use of insulin: Secondary | ICD-10-CM | POA: Diagnosis not present

## 2020-06-28 DIAGNOSIS — M859 Disorder of bone density and structure, unspecified: Secondary | ICD-10-CM | POA: Diagnosis not present

## 2020-06-28 DIAGNOSIS — K7460 Unspecified cirrhosis of liver: Secondary | ICD-10-CM

## 2020-06-28 MED ORDER — CETIRIZINE HCL 10 MG PO TABS: 10.0000 mg | ORAL_TABLET | Freq: Every day | ORAL | 0 refills | Status: DC

## 2020-06-28 MED ORDER — FLUTICASONE PROPIONATE HFA 44 MCG/ACT IN AERO: 1.0000 | INHALATION_SPRAY | Freq: Two times a day (BID) | RESPIRATORY_TRACT | 2 refills | Status: DC

## 2020-06-28 MED ORDER — OLMESARTAN MEDOXOMIL 20 MG PO TABS
20.0000 mg | ORAL_TABLET | Freq: Every day | ORAL | 1 refills | Status: DC
Start: 2020-06-28 — End: 2020-09-20

## 2020-06-28 NOTE — Patient Instructions (Addendum)
Ms. Cacho,  It was nice seeing you in the clinic today.  Here is a summary of what we talked about:  1.  Bronchitis: I am glad your symptoms are improving.  Please continue taking guaifenesin and dextromethorphan for your cough and congestion.  I sent a prescription of Flovent (steroid inhaler) to pharmacy, please use it once daily.  It can help reduce the amount of rescue inhaler use a day.  Please also take allergy medication such as Claritin or Zyrtec.  2.  High blood pressure: I will increase the olmesartan to 20 mg daily.  Please come back in 4 weeks for blood pressure recheck and blood work.  3.  Leg aching: I will check your blood count, electrolytes and vitamin D level.  4.  Diabetes: I will check A1c today.  Please come back in 4 weeks  Thank you,  Dr. Alfonse Spruce

## 2020-06-28 NOTE — Assessment & Plan Note (Addendum)
Patient reports bilateral lower leg aching that has been going on for 6 months.  States the pain only happens at night.  Better with leg elevation.  Worse with walking.  She denies any shooting pain from her hips.  Denies numbness tingling or loss of sensation in bilateral lower extremities.  Assessment and plan Doubt DVT given normal physical exam.  Patient was on ferrous sulfate for IDA.  Will check CBC, ferritin, CMP and vitamin D to rule out any underlying causes.  Addendum All blood work came back unremarkable.  No clear cause at this time.  Continue to monitor.  She will contact us if symptoms are worse.

## 2020-06-28 NOTE — Assessment & Plan Note (Addendum)
Home regimen: Metformin 500 mg twice daily and Jardiance 10 mg daily Last A1c was 7.9 in 11/2019.  -Repeat A1c today  Addendum Repeat A1c of 8.9.  We had a discussion about healthier food choices which include cut back on sweet food, sugary drinks or starch.  Patient states that she was on a higher dose of metformin in the past but could not tolerate to due to side effects.  We will increase Jardiance to 25 mg daily.  -Increase Jardiance to 25 mg daily -A1C in 3 months

## 2020-06-28 NOTE — Assessment & Plan Note (Signed)
Patient received 2 shots of COVID-vaccine and the first booster

## 2020-06-28 NOTE — Progress Notes (Signed)
   CC: Follow up on bronchitis   HPI:  [name] is a 75 y.o. with past medical history of asthma, type 2 diabetes, hypertension, cirrhosis, who presented to clinic to follow-up on bronchitis and legs aching.  Please see problem based charting for further detailed  Past Medical History:  Diagnosis Date  . Anxiety attack 08/22/2017  . Arthritis   . Asthma   . Cataract   . Cirrhosis ([place])   . Diabetes mellitus   . GERD (gastroesophageal reflux disease)   . Hyperlipidemia   . Hypertension   . Osteoporosis    Review of Systems: As per HPI  Physical Exam:  Vitals:   06/28/20 1039 06/28/20 1125  BP: (!) 154/76 (!) 150/75  Pulse: 78 75  Temp: 98.2 F (36.8 C)   TempSrc: Oral   SpO2: 92%   Weight: 170 lb 12.8 oz (77.5 kg)   Height: 5\' 6"  (1.676 m)    Physical Exam Constitutional:      General: She is not in acute distress.    Appearance: She is not ill-appearing or toxic-appearing.  HENT:     Head: Normocephalic.  Eyes:     General: No scleral icterus.       Right eye: No discharge.        Left eye: No discharge.     Conjunctiva/sclera: Conjunctivae normal.  Cardiovascular:     Rate and Rhythm: Normal rate and regular rhythm.     Heart sounds: No murmur heard.     Comments: No LE edema Pulmonary:     Effort: Pulmonary effort is normal. No respiratory distress.     Breath sounds: Normal breath sounds. No wheezing.  Musculoskeletal:        General: Normal range of motion.     Comments: Bilat lower LE appear normal, no edema, erythema or pain to palpation. +2 pedis dorsalis pulse palpated bilat  Skin:    General: Skin is warm.     Coloration: Skin is not jaundiced.  Neurological:     General: No focal deficit present.     Mental Status: She is alert and oriented to person, place, and time.  Psychiatric:        Mood and Affect: Mood normal.        Thought Content: Thought content normal.        Judgment: Judgment normal.     Assessment & Plan:    See Encounters Tab for problem based charting.  Patient discussed with Dr. Jimmye Norman

## 2020-06-28 NOTE — Assessment & Plan Note (Signed)
Her bronchitis symptoms started in March.  She was on 2 courses of Z-Pak and prednisone.  She finished the last course of antibiotic on April 11.  States that her symptoms get better with Z-Pak and worsens after finishing course.  She states overall she is feeling better.  Still report persistent cough, congestion and runny nose.  She denies any fever or pain.  States that she has to use albuterol inhaler 3 times a day.  Reports wheezing with laying down or laying on the left side.  Patient reports seasonal allergy which she used a Nettie pot long-term ago.  Currently not taking any allergy medication.  Assessment and plan Overall patient is improving.  Chest x-ray was normal without any infiltrates.  Do not think that patient will need any additional antibiotic or steroid.  Recommend continue one half a single dextromethorphan for cough.  Advised starting antihistamine for her seasonal allergy.

## 2020-06-28 NOTE — Assessment & Plan Note (Signed)
Blood pressure elevated today at 154/76, repeat blood pressure 150/70.  Patient is only on low-dose of olmesartan 10 mg daily.  -We will increase olmesartan to 20 mg daily -Follow-up in 4 weeks for blood pressure recheck and BMP

## 2020-06-28 NOTE — Assessment & Plan Note (Signed)
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  ANA was negative.  -Monitor CMP today

## 2020-06-28 NOTE — Assessment & Plan Note (Signed)
Patient reports using albuterol inhaler 3 times a day since March.  Her worsened respiratory status is likely due to persistent bronchitis.  Will add inhaled corticosteroid to reduce the frequency of albuterol use.  -Start Flovent -Continue albuterol as needed

## 2020-06-29 LAB — VITAMIN D 25 HYDROXY (VIT D DEFICIENCY, FRACTURES): Vit D, 25-Hydroxy: 37.4 ng/mL (ref 30.0–100.0)

## 2020-06-29 LAB — CMP14 + ANION GAP
ALT: 62 IU/L — ABNORMAL HIGH (ref 0–32)
AST: 67 IU/L — ABNORMAL HIGH (ref 0–40)
Albumin/Globulin Ratio: 1.7 (ref 1.2–2.2)
Albumin: 4.5 g/dL (ref 3.7–4.7)
Alkaline Phosphatase: 105 IU/L (ref 44–121)
Anion Gap: 16 mmol/L (ref 10.0–18.0)
BUN/Creatinine Ratio: 14 (ref 12–28)
BUN: 11 mg/dL (ref 8–27)
Bilirubin Total: 0.6 mg/dL (ref 0.0–1.2)
CO2: 21 mmol/L (ref 20–29)
Calcium: 9.3 mg/dL (ref 8.7–10.3)
Chloride: 106 mmol/L (ref 96–106)
Creatinine, Ser: 0.76 mg/dL (ref 0.57–1.00)
Globulin, Total: 2.7 g/dL (ref 1.5–4.5)
Glucose: 137 mg/dL — ABNORMAL HIGH (ref 65–99)
Potassium: 4.1 mmol/L (ref 3.5–5.2)
Sodium: 143 mmol/L (ref 134–144)
Total Protein: 7.2 g/dL (ref 6.0–8.5)
eGFR: 83 mL/min/{1.73_m2} (ref >59)

## 2020-06-29 LAB — FERRITIN: Ferritin: 34 ng/mL (ref 15–150)

## 2020-06-29 LAB — HEMOGLOBIN A1C
Est. average glucose Bld gHb Est-mCnc: 209 mg/dL
Hgb A1c MFr Bld: 8.9 % — ABNORMAL HIGH (ref 4.8–5.6)

## 2020-06-29 LAB — CBC
Hematocrit: 40.4 % (ref 34.0–46.6)
Hemoglobin: 13.4 g/dL (ref 11.1–15.9)
MCH: 29.9 pg (ref 26.6–33.0)
MCHC: 33.2 g/dL (ref 31.5–35.7)
MCV: 90 fL (ref 79–97)
Platelets: 159 10*3/uL (ref 150–450)
RBC: 4.48 x10E6/uL (ref 3.77–5.28)
RDW: 13.7 % (ref 11.7–15.4)
WBC: 4.6 10*3/uL (ref 3.4–10.8)

## 2020-06-30 MED ORDER — EMPAGLIFLOZIN 25 MG PO TABS: 25.0000 mg | ORAL_TABLET | Freq: Every day | ORAL | 2 refills | Status: DC

## 2020-06-30 NOTE — Addendum Note (Signed)
Addended byGaylan Gerold on: 06/30/2020 11:48 AM   Modules accepted: Orders

## 2020-07-02 NOTE — Progress Notes (Signed)
Internal Medicine Clinic Attending  Case discussed with Dr. Nguyen  At the time of the visit.  We reviewed the resident's history and exam and pertinent patient test results.  I agree with the assessment, diagnosis, and plan of care documented in the resident's note. 

## 2020-07-10 ENCOUNTER — Other Ambulatory Visit: Payer: Self-pay

## 2020-07-10 ENCOUNTER — Encounter: Payer: Self-pay | Admitting: Podiatry

## 2020-07-10 ENCOUNTER — Ambulatory Visit: Payer: Medicare Other | Admitting: Podiatry

## 2020-07-10 DIAGNOSIS — Z794 Long term (current) use of insulin: Secondary | ICD-10-CM | POA: Diagnosis not present

## 2020-07-10 DIAGNOSIS — R1032 Left lower quadrant pain: Secondary | ICD-10-CM | POA: Insufficient documentation

## 2020-07-10 DIAGNOSIS — M2041 Other hammer toe(s) (acquired), right foot: Secondary | ICD-10-CM

## 2020-07-10 DIAGNOSIS — M2042 Other hammer toe(s) (acquired), left foot: Secondary | ICD-10-CM

## 2020-07-10 DIAGNOSIS — K5730 Diverticulosis of large intestine without perforation or abscess without bleeding: Secondary | ICD-10-CM | POA: Insufficient documentation

## 2020-07-10 DIAGNOSIS — L309 Dermatitis, unspecified: Secondary | ICD-10-CM | POA: Insufficient documentation

## 2020-07-10 DIAGNOSIS — E113299 Type 2 diabetes mellitus with mild nonproliferative diabetic retinopathy without macular edema, unspecified eye: Secondary | ICD-10-CM | POA: Diagnosis not present

## 2020-07-10 NOTE — Progress Notes (Signed)
This patient returns to my office for at risk foot care.  This patient requires this care by a professional since this patient will be at risk due to having type 2 diabetes and chronic venous insufficiency.  This patient states that she has peeling noted under the balls of both feet.  She says that she feels sharp rocklike sensations on the bottom of her forefeet.  She says she has already received insoles but says they are very hard for the bottom of her feet.   this patient presents for an evaluation and treatment of her feet.  She is diabetic.  She presents to the office with her daughter.  General Appearance  Alert, conversant and in no acute stress.  Vascular  Dorsalis pedis and posterior tibial  pulses are palpable  bilaterally.  Capillary return is within normal limits  bilaterally. Temperature is within normal limits  bilaterally.  Neurologic  Senn-Weinstein monofilament wire test within normal limits  bilaterally. Muscle power within normal limits bilaterally.  Nails Thick disfigured discolored nails with subungual debris  from hallux to fifth toes bilaterally. No evidence of bacterial infection or drainage bilaterally.  Orthopedic  No limitations of motion  feet .  No crepitus or effusions noted.  No bony pathology or digital deformities noted.  HAV  B/L.  Hammer toes 2-5  B/L.  Skin  normotropic skin with no porokeratosis noted bilaterally except under the foreeft  B/L.  No redness pr callus formation forefeet  B/L.Marland Kitchen  No signs of infections or ulcers noted.     Dermatitis.Forefeet  B/L.   Consent was obtained for treatment procedures.  ROV.  Told her to use vaseline.  Prescirbed spenco insoles instead of her hard orthoses.   Return office visit      4 months                Told patient to return for periodic foot care and evaluation due to potential at risk complications.   Gardiner Barefoot DPM

## 2020-07-25 ENCOUNTER — Encounter: Payer: Self-pay | Admitting: Internal Medicine

## 2020-07-28 ENCOUNTER — Encounter: Payer: Self-pay | Admitting: Internal Medicine

## 2020-07-28 DIAGNOSIS — J40 Bronchitis, not specified as acute or chronic: Secondary | ICD-10-CM

## 2020-08-08 MED ORDER — CETIRIZINE HCL 10 MG PO TABS: 10.0000 mg | ORAL_TABLET | Freq: Every day | ORAL | 3 refills | Status: DC

## 2020-08-22 ENCOUNTER — Encounter: Payer: Self-pay | Admitting: *Deleted

## 2020-08-22 NOTE — Progress Notes (Signed)
Patient was seen in office today for diabetic shoe measurement and selection by EJ with [facility]. Patient selected shoes and was casted with a foam box at this time. Dr. Daryll Drown treats patient's diabetes. Patient advised that the office will call when the shoes are available for pick-up. Patient verbalized understanding.

## 2020-08-29 ENCOUNTER — Encounter: Payer: Self-pay | Admitting: Internal Medicine

## 2020-09-12 ENCOUNTER — Encounter: Payer: Self-pay | Admitting: Internal Medicine

## 2020-09-13 ENCOUNTER — Telehealth: Payer: Self-pay

## 2020-09-13 NOTE — Progress Notes (Signed)
Medication Samples have been provided to the patient.   Drug name: JARDIANCE       Strength: '25MG'$         Qty: #28  LOTPR:[id number]  Exp.Date: 10/17/2020  Dosing instructions: TAKE 1 TABLET BEFORE BREAKFAST  Sagan Wurzel N Aryaan Persichetti 2:02 PM 09/13/2020   **A [employer] PT ASSISTANCE APPLICATION IS ALSO ATTACHED FOR THE PT TO SIGN OR TAKE HOME & RETURN. PT AWARE**

## 2020-09-13 NOTE — Telephone Encounter (Signed)
Created in error

## 2020-09-14 NOTE — Progress Notes (Signed)
Pt picked up samples and PAP paperwork

## 2020-09-18 ENCOUNTER — Other Ambulatory Visit: Payer: Self-pay | Admitting: Internal Medicine

## 2020-09-18 DIAGNOSIS — I10 Essential (primary) hypertension: Secondary | ICD-10-CM

## 2020-09-19 ENCOUNTER — Other Ambulatory Visit: Payer: Self-pay | Admitting: Internal Medicine

## 2020-09-20 ENCOUNTER — Other Ambulatory Visit: Payer: Self-pay | Admitting: Internal Medicine

## 2020-09-20 ENCOUNTER — Encounter: Payer: Self-pay | Admitting: Internal Medicine

## 2020-09-28 DIAGNOSIS — M545 Low back pain, unspecified: Secondary | ICD-10-CM | POA: Diagnosis not present

## 2020-10-13 ENCOUNTER — Encounter: Payer: Self-pay | Admitting: Internal Medicine

## 2020-10-18 ENCOUNTER — Ambulatory Visit: Payer: Medicare Other | Admitting: Podiatry

## 2020-10-24 NOTE — Progress Notes (Signed)
Provided pt more samples of jardiance '25mg'$ . Pt should be returning PAP paperwork as soon as possible.   Medication also needs to be added to pt's med list  Medication Samples have been provided to the patient.  Drug name: JARDIANCE       Strength: '25MG'$         Qty: 28 TABS  LOT: [id number]  Exp.Date: 11/2020  Dosing instructions: TAKE 1 TABLET AT [place] 9:03 AM 10/24/2020

## 2020-10-26 ENCOUNTER — Ambulatory Visit (INDEPENDENT_AMBULATORY_CARE_PROVIDER_SITE_OTHER): Payer: Medicare Other | Admitting: Pharmacist

## 2020-10-26 ENCOUNTER — Encounter: Payer: Self-pay | Admitting: Pharmacist

## 2020-10-26 VITALS — BP 151/75 | Wt 176.5 lb

## 2020-10-26 DIAGNOSIS — Z0000 Encounter for general adult medical examination without abnormal findings: Secondary | ICD-10-CM

## 2020-10-26 NOTE — Progress Notes (Signed)
This AWV is being conducted in-person at [facility]  Subjective:   Alyssa Haynes is a 75 y.o. female who presents for a Medicare Annual Wellness Visit.  The following items have been reviewed and updated today in the appropriate area in the EMR.   Health Risk Assessment  Height, weight, BMI, and BP Visual acuity if needed Depression screen Fall risk / safety level Advance directive discussion Medical and family history were reviewed and updated Updating list of other providers & suppliers Medication reconciliation, including over the counter medicines Cognitive screen Written screening schedule Risk Factor list Personalized health advice, risky behaviors, and treatment advice  Social History   Social History Narrative   Current Social History 10/26/2020       Patient lives with spouse in a one level home with 2-3 outside steps with handrails       Patient's method of transportation is personal car.      The highest level of education was Bachelor's Degree      The patient currently retired from the [employer]      Identified important Relationships are "My husband and all my children."       Pets : 1 dog, Scientist, research (physical sciences) / Fun: "Going to [employer], watching old movies, working in the [employer] garden, feed my birds"      Current Stressors: "My husband."       Religious / Personal Beliefs: "Baptist"             Objective:    Vitals: BP (!) 151/75   Wt 176 lb 8 oz (80.1 kg)   BMI 28.49 kg/m  Vitals are [facility] performed -- Activities of Daily Living In your present state of health, do you have any difficulty performing the following activities: 10/26/2020 06/28/2020  Hearing? N N  Vision? N N  Comment - SEEN BY EYE DR RECENTLY  Difficulty concentrating or making decisions? Y N  Walking or climbing stairs? N Y  Comment - AT TIMES  Dressing or bathing? N N  Doing errands, shopping? N N  Preparing Food and eating ? N -  Using the Toilet? N -  In the  past six months, have you accidently leaked urine? Y -  Do you have problems with loss of bowel control? N -  Managing your Medications? N -  Managing your Finances? N -  Housekeeping or managing your Housekeeping? N -  Some recent data might be hidden    Goals  Goals       Blood Pressure < 140/90      Continue walking 3x per week (45 min per time) (pt-stated)      Also, begin seated and standing exercises with exercise band.       HEMOGLOBIN A1C < 7.0      LDL CALC < 100      Plan meals- 50-60 grams carb for meals 40-45 for snack        Fall Risk Fall Risk  10/26/2020 06/28/2020 05/04/2020 12/10/2019 10/27/2019  Falls in the past year? 0 0 0 0 0  Number falls in past yr: 0 - - - -  Comment - - - - -  Injury with Fall? 0 - - - -  Comment - - - - -  Risk Factor Category  - - - - -  Risk for fall due to : No Fall Risks - - No Fall Risks No Fall Risks  Follow up Falls evaluation completed;Falls prevention discussed - - - Falls prevention discussed    Depression Screen PHQ 2/9 Scores 10/26/2020 06/28/2020 05/04/2020 12/10/2019  PHQ - 2 Score 0 0 0 0  PHQ- 9 Score - 1 1 -     Cognitive Testing Six-Item Cognitive Screener   "I would like to ask you some questions that ask you to use your memory. I am going to name three objects. Please wait until I say all three words, then repeat them. Remember what they are  because I am going to ask you to name them again in a few minutes. Please repeat these words for me: APPLE--TABLE--PENNY." (Interviewer may repeat names 3 times if necessary but repetition not scored.)  Did patient correctly repeat all three words? Yes - may proceed with screen  What year is this? Correct What month is this? Correct What day of the week is this? Correct  What were the three objects I asked you to remember? Apple Correct Table Correct Penny Correct  Score one point for each incorrect answer.  A score of 2 or more points warrants additional investigation.   Patient's score 0     Assessment and Plan:    During the course of the visit the patient was educated and counseled about appropriate screening and preventive services as documented in the assessment and plan.   Recommended Shingles, Tetanus, and pneumonia vaccines. You can get the Shingles at your local pharmacy. Patient to discuss with PCP  Patient instructed to schedule appointment with PCP for lipid panel and A1C.  Patient's blood pressure was elevated in clinic. Instructed patient to check blood pressure at home and record readings to bring to next visit.  The printed AVS was given to the patient and included an updated screening schedule, a list of risk factors, and personalized health advice.       Hughes Better, RPH-CPP  11/06/2020

## 2020-10-26 NOTE — Patient Instructions (Addendum)
Annual Wellness Visit   Medicare Covered Preventative Screenings and Services  Services & Screenings Men and Women Who How Often Need? Date of Last Service Action  Abdominal Aortic Aneurysm Adults with AAA risk factors Once     Alcohol Misuse and Counseling All Adults Screening once a year if no alcohol misuse. Counseling up to 4 face to face sessions.     Bone Density Measurement  Adults at risk for osteoporosis Once every 2 yrs     Lipid Panel Z13.6 All adults without CV disease Once every 5 yrs     Colorectal Cancer  Stool sample or Colonoscopy All adults 45 and older  Once every year Every 10 years     Depression All Adults Once a year  Today   Diabetes Screening Blood glucose, post glucose load, or GTT Z13.1 All adults at risk Pre-diabetics Once per year Twice per year     Diabetes  Self-Management Training All adults Diabetics 10 hrs first year; 2 hours subsequent years. Requires Copay     Glaucoma Diabetics Family history of glaucoma African Americans 83 yrs + Hispanic Americans 34 yrs + Annually - requires coppay     Hepatitis C Z72.89 or F19.20 High Risk for HCV Born between 1945 and 1965 Annually Once     HIV Z11.4 All adults based on risk Annually btw ages 74 & 79 regardless of risk Annually > 65 yrs if at increased risk     Lung Cancer Screening Asymptomatic adults aged 39-77 with 30 pack yr history and current smoker OR quit within the last 15 yrs Annually Must have counseling and shared decision making documentation before first screen     Medical Nutrition Therapy Adults with  Diabetes Renal disease Kidney transplant within past 3 yrs 3 hours first year; 2 hours subsequent years     Obesity and Counseling All adults Screening once a year Counseling if BMI 30 or higher  Today   Tobacco Use Counseling Adults who use tobacco  Up to 8 visits in one year     Vaccines Z23 Hepatitis B Influenza  Pneumonia  Adults  Once Once every flu season Two different  vaccines separated by one year     Next Annual Wellness Visit People with Medicare Every year  Today     Services & Screenings Women Who How Often Need  Date of Last Service Action  Mammogram  Z12.31 Women over 22 One baseline ages 62-39. Annually ager 40 yrs+     Pap tests All women Annually if high risk. Every 2 yrs for normal risk women     Screening for cervical cancer with  Pap (Z01.419 nl or Z01.411abnl) & HPV Z11.51 Women aged 67 to 27 Once every 5 yrs     Screening pelvic and breast exams All women Annually if high risk. Every 2 yrs for normal risk women     Sexually Transmitted Diseases Chlamydia Gonorrhea Syphilis All at risk adults Annually for non pregnant females at increased risk         [place] Men Who How Ofter Need  Date of Last Service Action  Prostate Cancer - DRE & PSA Men over 50 Annually.  DRE might require a copay.     Sexually Transmitted Diseases Syphilis All at risk adults Annually for men at increased risk         Things That May Be Affecting Your Health:  Alcohol  Hearing loss  Pain    Depression  Home  Safety  Sexual Health   Diabetes X Lack of physical activity  Stress   Difficulty with daily activities  Loneliness  Tiredness   Drug use  Medicines  Tobacco use   Falls  Motor Vehicle Safety  Weight   Food choices  Oral Health  Other    YOUR PERSONALIZED HEALTH PLAN : 1. Schedule your next subsequent Medicare Wellness visit in one year 2. Attend all of your regular appointments to address your medical issues 3. Complete the preventative screenings and services 4. Recommended Shingles, Tetanus, and pneumonia vaccines. You can get the Shingles at your local pharmacy. 5. Schedule an appointment with your doctor when you get a chance as you are due for lipids and A1C.     Fall Prevention in the Home, Adult Falls can cause injuries and can happen to people of all ages. There are many things you can do to make your home safe and  to help prevent falls. Ask for help when making these changes. What actions can I take to prevent falls? General Instructions Use good lighting in all rooms. Replace any light bulbs that burn out. Turn on the lights in dark areas. Use night-lights. Keep items that you use often in easy-to-reach places. Lower the shelves around your home if needed. Set up your furniture so you have a clear path. Avoid moving your furniture around. Do not have throw rugs or other things on the floor that can make you trip. Avoid walking on wet floors. If any of your floors are uneven, fix them. Add color or contrast paint or tape to clearly mark and help you see: Grab bars or handrails. First and last steps of staircases. Where the edge of each step is. If you use a stepladder: Make sure that it is fully opened. Do not climb a closed stepladder. Make sure the sides of the stepladder are locked in place. Ask someone to hold the stepladder while you use it. Know where your pets are when moving through your home. What can I do in the bathroom?   Keep the floor dry. Clean up any water on the floor right away. Remove soap buildup in the tub or shower. Use nonskid mats or decals on the floor of the tub or shower. Attach bath mats securely with double-sided, nonslip rug tape. If you need to sit down in the shower, use a plastic, nonslip stool. Install grab bars by the toilet and in the tub and shower. Do not use towel bars as grab bars. What can I do in the bedroom? Make sure that you have a light by your bed that is easy to reach. Do not use any sheets or blankets for your bed that hang to the floor. Have a firm chair with side arms that you can use for support when you get dressed. What can I do in the kitchen? Clean up any spills right away. If you need to reach something above you, use a step stool with a grab bar. Keep electrical cords out of the way. Do not use floor polish or wax that makes floors  slippery. What can I do with my stairs? Do not leave any items on the stairs. Make sure that you have a light switch at the top and the bottom of the stairs. Make sure that there are handrails on both sides of the stairs. Fix handrails that are broken or loose. Install nonslip stair treads on all your stairs. Avoid having throw rugs at the top  or bottom of the stairs. Choose a carpet that does not hide the edge of the steps on the stairs. Check carpeting to make sure that it is firmly attached to the stairs. Fix carpet that is loose or worn. What can I do on the outside of my home? Use bright outdoor lighting. Fix the edges of walkways and driveways and fix any cracks. Remove anything that might make you trip as you walk through a door, such as a raised step or threshold. Trim any bushes or trees on paths to your home. Check to see if handrails are loose or broken and that both sides of all steps have handrails. Install guardrails along the edges of any raised decks and porches. Clear paths of anything that can make you trip, such as tools or rocks. Have leaves, snow, or ice cleared regularly. Use sand or salt on paths during winter. Clean up any spills in your garage right away. This includes grease or oil spills. What other actions can I take? Wear shoes that: Have a low heel. Do not wear high heels. Have rubber bottoms. Feel good on your feet and fit well. Are closed at the toe. Do not wear open-toe sandals. Use tools that help you move around if needed. These include: Canes. Walkers. Scooters. Crutches. Review your medicines with your doctor. Some medicines can make you feel dizzy. This can increase your chance of falling. Ask your doctor what else you can do to help prevent falls. Where to find more information Centers for Disease Control and Prevention, STEADI: http://www.wolf.info/ National Institute on Aging: http://kim-miller.com/ Contact a doctor if: You are afraid of falling at  home. You feel weak, drowsy, or dizzy at home. You fall at home. Summary There are many simple things that you can do to make your home safe and to help prevent falls. Ways to make your home safe include removing things that can make you trip and installing grab bars in the bathroom. Ask for help when making these changes in your home. This information is not intended to replace advice given to you by your health care provider. Make sure you discuss any questions you have with your health care provider. Document Revised: 09/08/2019 Document Reviewed: 09/08/2019 Elsevier Patient Education  [place] Maintenance, Female Adopting a healthy lifestyle and getting preventive care are important in promoting health and wellness. Ask your health care provider about: The right schedule for you to have regular tests and exams. Things you can do on your own to prevent diseases and keep yourself healthy. What should I know about diet, weight, and exercise? Eat a healthy diet  Eat a diet that includes plenty of vegetables, fruits, low-fat dairy products, and lean protein. Do not eat a lot of foods that are high in solid fats, added sugars, or sodium. Maintain a healthy weight Body mass index (BMI) is used to identify weight problems. It estimates body fat based on height and weight. Your health care provider can help determine your BMI and help you achieve or maintain a healthy weight. Get regular exercise Get regular exercise. This is one of the most important things you can do for your health. Most adults should: Exercise for at least 150 minutes each week. The exercise should increase your heart rate and make you sweat (moderate-intensity exercise). Do strengthening exercises at least twice a week. This is in addition to the moderate-intensity exercise. Spend less time sitting. Even light physical activity can be beneficial. Watch cholesterol and  blood lipids Have your blood tested  for lipids and cholesterol at 73 years of age, then have this test every 5 years. Have your cholesterol levels checked more often if: Your lipid or cholesterol levels are high. You are older than 73 years of age. You are at high risk for heart disease. What should I know about cancer screening? Depending on your health history and family history, you may need to have cancer screening at various ages. This may include screening for: Breast cancer. Cervical cancer. Colorectal cancer. Skin cancer. Lung cancer. What should I know about heart disease, diabetes, and high blood pressure? Blood pressure and heart disease High blood pressure causes heart disease and increases the risk of stroke. This is more likely to develop in people who have high blood pressure readings, are of African descent, or are overweight. Have your blood pressure checked: Every 3-5 years if you are 68-1 years of age. Every year if you are 26 years old or older. Diabetes Have regular diabetes screenings. This checks your fasting blood sugar level. Have the screening done: Once every three years after age 61 if you are at a normal weight and have a low risk for diabetes. More often and at a younger age if you are overweight or have a high risk for diabetes. What should I know about preventing infection? Hepatitis B If you have a higher risk for hepatitis B, you should be screened for this virus. Talk with your health care provider to find out if you are at risk for hepatitis B infection. Hepatitis C Testing is recommended for: Everyone born from [birth year] through 1965. Anyone with known risk factors for hepatitis C. Sexually transmitted infections (STIs) Get screened for STIs, including gonorrhea and chlamydia, if: You are sexually active and are younger than 73 years of age. You are older than 73 years of age and your health care provider tells you that you are at risk for this type of infection. Your sexual activity  has changed since you were last screened, and you are at increased risk for chlamydia or gonorrhea. Ask your health care provider if you are at risk. Ask your health care provider about whether you are at high risk for HIV. Your health care provider may recommend a prescription medicine to help prevent HIV infection. If you choose to take medicine to prevent HIV, you should first get tested for HIV. You should then be tested every 3 months for as long as you are taking the medicine. Pregnancy If you are about to stop having your period (premenopausal) and you may become pregnant, seek counseling before you get pregnant. Take 400 to 800 micrograms (mcg) of folic acid every day if you become pregnant. Ask for birth control (contraception) if you want to prevent pregnancy. Osteoporosis and menopause Osteoporosis is a disease in which the bones lose minerals and strength with aging. This can result in bone fractures. If you are 63 years old or older, or if you are at risk for osteoporosis and fractures, ask your health care provider if you should: Be screened for bone loss. Take a calcium or vitamin D supplement to lower your risk of fractures. Be given hormone replacement therapy (HRT) to treat symptoms of menopause. Follow these instructions at home: Lifestyle Do not use any products that contain nicotine or tobacco, such as cigarettes, e-cigarettes, and chewing tobacco. If you need help quitting, ask your health care provider. Do not use street drugs. Do not share needles. Ask your health  care provider for help if you need support or information about quitting drugs. Alcohol use Do not drink alcohol if: Your health care provider tells you not to drink. You are pregnant, may be pregnant, or are planning to become pregnant. If you drink alcohol: Limit how much you use to 0-1 drink a day. Limit intake if you are breastfeeding. Be aware of how much alcohol is in your drink. In the [place] one drink  equals one 12 oz bottle of beer (355 mL), one 5 oz glass of wine (148 mL), or one 1 oz glass of hard liquor (44 mL). General instructions Schedule regular health, dental, and eye exams. Stay current with your vaccines. Tell your health care provider if: You often feel depressed. You have ever been abused or do not feel safe at home. Summary Adopting a healthy lifestyle and getting preventive care are important in promoting health and wellness. Follow your health care provider's instructions about healthy diet, exercising, and getting tested or screened for diseases. Follow your health care provider's instructions on monitoring your cholesterol and blood pressure. This information is not intended to replace advice given to you by your health care provider. Make sure you discuss any questions you have with your health care provider. Document Revised: 04/14/2020 Document Reviewed: 01/28/2018 Elsevier Patient Education  2022 [employer].

## 2020-10-27 ENCOUNTER — Other Ambulatory Visit: Payer: Self-pay | Admitting: Internal Medicine

## 2020-10-27 DIAGNOSIS — E113299 Type 2 diabetes mellitus with mild nonproliferative diabetic retinopathy without macular edema, unspecified eye: Secondary | ICD-10-CM

## 2020-10-27 DIAGNOSIS — Z794 Long term (current) use of insulin: Secondary | ICD-10-CM

## 2020-10-27 MED ORDER — EMPAGLIFLOZIN 25 MG PO TABS: 25.0000 mg | ORAL_TABLET | Freq: Every day | ORAL | 0 refills | Status: DC

## 2020-11-13 NOTE — Progress Notes (Signed)
I discussed the AWV findings with the provider who conducted the visit. I was present in the office suite and immediately available to provide assistance and direction throughout the time the service was provided.  ?

## 2020-11-20 ENCOUNTER — Encounter: Payer: Self-pay | Admitting: Internal Medicine

## 2020-11-28 NOTE — Progress Notes (Signed)
AWV completed by Dr. Georgina Peer has been reviewed with Dr. Lisabeth Devoid and agree with findings and recommendations.

## 2020-12-13 ENCOUNTER — Other Ambulatory Visit: Payer: Self-pay

## 2020-12-13 ENCOUNTER — Emergency Department (HOSPITAL_BASED_OUTPATIENT_CLINIC_OR_DEPARTMENT_OTHER)
Admission: EM | Admit: 2020-12-13 | Discharge: 2020-12-13 | Disposition: A | Discharge status: Home / Self Care | Payer: Medicare Other | Attending: Emergency Medicine | Admitting: Emergency Medicine

## 2020-12-13 ENCOUNTER — Encounter (HOSPITAL_BASED_OUTPATIENT_CLINIC_OR_DEPARTMENT_OTHER): Payer: Self-pay

## 2020-12-13 DIAGNOSIS — R519 Headache, unspecified: Secondary | ICD-10-CM | POA: Insufficient documentation

## 2020-12-13 DIAGNOSIS — R739 Hyperglycemia, unspecified: Secondary | ICD-10-CM | POA: Insufficient documentation

## 2020-12-13 DIAGNOSIS — Z5321 Procedure and treatment not carried out due to patient leaving prior to being seen by health care provider: Secondary | ICD-10-CM | POA: Diagnosis not present

## 2020-12-13 LAB — URINALYSIS, ROUTINE W REFLEX MICROSCOPIC
Bilirubin Urine: NEGATIVE
Glucose, UA: >1000 mg/dL — AB
Hgb urine dipstick: NEGATIVE
Ketones, ur: NEGATIVE mg/dL
Leukocytes,Ua: NEGATIVE
Nitrite: NEGATIVE
Protein, ur: NEGATIVE mg/dL
Specific Gravity, Urine: 1.028 (ref 1.005–1.030)
pH: 6.5 (ref 5.0–8.0)

## 2020-12-13 LAB — BASIC METABOLIC PANEL
Anion gap: 10 (ref 5–15)
BUN: 14 mg/dL (ref 8–23)
CO2: 27 mmol/L (ref 22–32)
Calcium: 9.7 mg/dL (ref 8.9–10.3)
Chloride: 98 mmol/L (ref 98–111)
Creatinine, Ser: 0.77 mg/dL (ref 0.44–1.00)
GFR, Estimated: >60 mL/min (ref >60)
Glucose, Bld: 368 mg/dL — ABNORMAL HIGH (ref 70–99)
Potassium: 4 mmol/L (ref 3.5–5.1)
Sodium: 135 mmol/L (ref 135–145)

## 2020-12-13 LAB — CBC
HCT: 37.9 % (ref 36.0–46.0)
Hemoglobin: 12.4 g/dL (ref 12.0–15.0)
MCH: 29.4 pg (ref 26.0–34.0)
MCHC: 32.7 g/dL (ref 30.0–36.0)
MCV: 89.8 fL (ref 80.0–100.0)
Platelets: 125 10*3/uL — ABNORMAL LOW (ref 150–400)
RBC: 4.22 MIL/uL (ref 3.87–5.11)
RDW: 13.7 % (ref 11.5–15.5)
WBC: 4.4 10*3/uL (ref 4.0–10.5)
nRBC: 0 % (ref 0.0–0.2)

## 2020-12-13 LAB — CBG MONITORING, ED: Glucose-Capillary: 376 mg/dL — ABNORMAL HIGH (ref 70–99)

## 2020-12-13 NOTE — ED Notes (Signed)
Patient not in Waiting Area for EDP Assessment or VS Reassessment. Patient to be discharged appropriately.

## 2020-12-13 NOTE — ED Triage Notes (Signed)
Patient here POV from Home with Hyperglycemia.  Patient Highest CBG today was 474. No Acute Symptoms besides a Mild Headache. No Difficulty recently in controlled BG.  No N/V/D. No CP. No SOB.

## 2021-01-09 ENCOUNTER — Other Ambulatory Visit: Payer: Self-pay

## 2021-01-09 ENCOUNTER — Ambulatory Visit
Admission: EM | Admit: 2021-01-09 | Discharge: 2021-01-09 | Disposition: A | Discharge status: Home / Self Care | Payer: Medicare Other | Attending: Physician Assistant | Admitting: Physician Assistant

## 2021-01-09 DIAGNOSIS — J101 Influenza due to other identified influenza virus with other respiratory manifestations: Secondary | ICD-10-CM | POA: Diagnosis not present

## 2021-01-09 LAB — POCT INFLUENZA A/B
Influenza A, POC: POSITIVE — AB
Influenza B, POC: POSITIVE — AB

## 2021-01-09 MED ORDER — OSELTAMIVIR PHOSPHATE 75 MG PO CAPS: 75.0000 mg | ORAL_CAPSULE | Freq: Two times a day (BID) | ORAL | 0 refills | Status: DC

## 2021-01-09 NOTE — ED Provider Notes (Signed)
[facility]    CSN: [id number] Arrival date & time: 01/09/21  1749      History   Chief Complaint Chief Complaint  Patient presents with   Cough    HPI Alyssa Haynes is a 75 y.o. female.   Patient here today for evaluation of nasal congestion, cough for one week. She states she developed sore throat about 4 days ago. Cough seems worse at night. She has tried OTC meds without relief.   The history is provided by the patient.  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   Left lower quadrant pain 07/10/2020   Dermatitis 07/10/2020   Cirrhosis ([place]) 10/27/2019   Restless leg syndrome 09/18/2019   Osteopenia 11/05/2017   Anxiety attack 08/22/2017   Osteoarthritis of left hip 07/23/2017   Obesity (BMI 30.0-34.9) 04/04/2017   Epidermal inclusion cyst 12/25/2016   Mixed incontinence urge and stress 09/25/2016   Ovarian cyst 07/10/2016   Healthcare maintenance 04/01/2016   Chronic venous insufficiency 03/04/2014   Bronchitis 06/22/2009   Intrinsic asthma 09/02/2008   T2DM (type 2 diabetes mellitus) ([place]) 03/12/2006   Hyperlipidemia associated with type 2 diabetes mellitus ([place]) 03/12/2006   Essential hypertension 03/12/2006   GERD (gastroesophageal reflux disease) 12/28/2005    Past Surgical History:  Procedure Laterality Date   CESAREAN SECTION  1988   CHOLECYSTECTOMY  2006   COLONOSCOPY     8-9- years ago- unsure of MD   FRACTURE SURGERY  1992   lEFT ANKLE orif   plantar fasciitis Right    release    TUBAL LIGATION  1988    OB History   No obstetric history on file.      Home Medications    Prior to Admission medications   Medication Sig Start Date End Date Taking? Authorizing Provider  oseltamivir (TAMIFLU) 75 MG capsule Take 1 capsule (75 mg total) by mouth every 12 (twelve) hours. 01/09/21  Yes Francene Finders, PA-C  Apple Cid Vn-Grn Tea-Bit Or-Cr (APPLE CIDER VINEGAR PLUS) TABS Take as stated on bottle 03/26/19   Sid Falcon, MD  Ascorbic Acid (VITAMIN C PO) Take by mouth. 1000 mg po    [provider]  atorvastatin (LIPITOR) 20 MG tablet Take 1 tablet (20 mg total) by mouth daily. 12/10/19   Sid Falcon, MD  Biotin 1 MG CAPS Take 1 mg by mouth daily. 03/26/19   Sid Falcon, MD  Blood Glucose Monitoring Suppl (ACCU-CHEK AVIVA PLUS) w/Device KIT Use to check blood sugar 2 times daily before meals. diag code E11.9. noninsulin dependent 07/17/17   Annia Belt, MD  cetirizine (ZYRTEC ALLERGY) 10 MG tablet Take 1 tablet (10 mg total) by mouth daily. 08/08/20 09/07/20  Sid Falcon, MD  Cholecalciferol (VITAMIN D3 PO) Take by mouth.    [provider]  diazepam (VALIUM) 5 MG tablet TAKE 1/2 TO 1 TABLET BY MOUTH EVERY 12 HOURS AS NEEDED FOR ANXIETY  09/20/20   Sid Falcon, MD  Elderberry 575 MG/5ML SYRP Take 1 drop by mouth daily.    [provider]  empagliflozin (JARDIANCE) 25 MG TABS tablet Take 1 tablet (25 mg total) by mouth daily before breakfast. 10/24/20   Angelica Pou, MD  ferrous sulfate 325 (65 FE) MG tablet Take 1 tablet (325 mg total) by mouth at bedtime. 09/18/19 10/26/20  Harvie Heck, MD  fluticasone (FLOVENT HFA) 44 MCG/ACT inhaler Inhale 1 puff into the lungs 2 (two) times daily. 06/28/20 06/28/21  Gaylan Gerold, DO  glucose blood (ACCU-CHEK AVIVA PLUS) test strip Use to check blood sugar 2 times daily. diag code E11.9. noninsulin dependent 11/10/18   Sid Falcon, MD  Magnesium 400 MG CAPS Take by mouth daily.    [provider]  metFORMIN (GLUCOPHAGE) 500 MG tablet Take 1 tablet (500 mg total) by mouth 2 (two) times daily with a meal. 12/10/19   Sid Falcon, MD  Multiple Vitamin (MULTIVITAMIN) LIQD Take 5 mLs by mouth daily.    [provider]  olmesartan (BENICAR) 20 MG tablet Take 1 tablet (20 mg total) by mouth daily. 09/20/20   Sid Falcon, MD    Family History Family History  Problem Relation Age of Onset   Cancer Mother 12       lUNG CANCER   Osteoporosis Mother    Heart disease Father 52   Heart attack Father    Gout Father    Alcoholism Brother    Diabetes Daughter    Diabetes Daughter    Asthma Daughter    Asthma Son    Breast cancer Neg Hx    Colon cancer Neg Hx    Esophageal cancer Neg Hx    Rectal cancer Neg Hx    Stomach cancer Neg Hx     Social History Social History   Tobacco Use   Smoking status: Former    Types: Cigarettes    Quit date: 02/19/1995    Years since quitting: 25.9   Smokeless tobacco: Never  Vaping Use   Vaping Use: Never used  Substance Use Topics   Alcohol use: No    Alcohol/week: 0.0 standard drinks   Drug use: No     Allergies   Other   Review of Systems Review of Systems  Constitutional:  Negative for chills and fever.  HENT:  Positive for congestion, sinus pressure and sore throat. Negative for ear pain.   Eyes:  Negative for discharge and redness.  Respiratory:  Positive for cough. Negative for shortness of breath and wheezing.   Gastrointestinal:  Negative for abdominal pain, diarrhea, nausea and vomiting.    Physical Exam Triage Vital Signs ED Triage Vitals [01/09/21 1026]  Enc Vitals Group     BP 115/75     Pulse Rate (!) 104     Resp 18     Temp 98.5 F (36.9 C)     Temp Source Oral     SpO2 95 %     Weight      Height      Head Circumference      Peak Flow      Pain Score 4     Pain Loc      Pain Edu?      Excl. in [place]?    No data found.  Updated Vital Signs BP 115/75 (BP Location: Left Arm)    Pulse (!) 104   Temp 98.5 F (36.9 C) (  Oral)   Resp 18   SpO2 95%      Physical Exam Vitals and nursing note reviewed.  Constitutional:      General: She is not in acute distress.    Appearance: Normal appearance. She is not ill-appearing.  HENT:     Head: Normocephalic and atraumatic.     Right Ear: Tympanic membrane normal.     Left Ear: Tympanic membrane normal.     Nose: Congestion present.     Mouth/Throat:     Mouth: Mucous membranes are moist.     Pharynx: No oropharyngeal exudate or posterior oropharyngeal erythema.  Eyes:     Conjunctiva/sclera: Conjunctivae normal.  Cardiovascular:     Rate and Rhythm: Normal rate and regular rhythm.     Heart sounds: Normal heart sounds. No murmur heard. Pulmonary:     Effort: Pulmonary effort is normal. No respiratory distress.     Breath sounds: Normal breath sounds. No wheezing, rhonchi or rales.  Skin:    General: Skin is warm and dry.  Neurological:     Mental Status: She is alert.  Psychiatric:        Mood and Affect: Mood normal.        Thought Content: Thought content normal.     UC Treatments / Results  Labs (all labs ordered are listed, but only abnormal results are displayed) Labs Reviewed  POCT INFLUENZA A/B - Abnormal; Notable for the following components:      Result Value   Influenza A, POC Positive (*)    Influenza B, POC Positive (*)    All other components within normal limits    EKG   Radiology No results found.  Procedures Procedures (including critical care time)  Medications Ordered in UC Medications - No data to display  Initial Impression / Assessment and Plan / UC Course  I have reviewed the triage vital signs and the nursing notes.  Pertinent labs & imaging results that were available during my care of the patient were reviewed by me and considered in my medical decision making (see chart for details).    Flu test positive. Tamiflu prescribed given onset of new symptoms 4 days  ago. Encouraged symptomatic treatment, increased fluids and rest otherwise.Recommend follow up if symptoms fail to improve or worsen.   Final Clinical Impressions(s) / UC Diagnoses   Final diagnoses:  Influenza A   Discharge Instructions   None    ED Prescriptions     Medication Sig Dispense Auth. Provider   oseltamivir (TAMIFLU) 75 MG capsule Take 1 capsule (75 mg total) by mouth every 12 (twelve) hours. 10 capsule Francene Finders, PA-C      PDMP not reviewed this encounter.   Francene Finders, PA-C 01/09/21 1252

## 2021-01-09 NOTE — ED Triage Notes (Signed)
Pt c/o cough and congestion x1wk. States having a sore throat x4 days after cough up sputum. States cough is worse at night. States taking OTC meds with no relief.

## 2021-01-12 ENCOUNTER — Other Ambulatory Visit: Payer: Self-pay | Admitting: Student

## 2021-01-12 DIAGNOSIS — Z794 Long term (current) use of insulin: Secondary | ICD-10-CM

## 2021-01-12 DIAGNOSIS — E113299 Type 2 diabetes mellitus with mild nonproliferative diabetic retinopathy without macular edema, unspecified eye: Secondary | ICD-10-CM

## 2021-01-22 ENCOUNTER — Encounter: Payer: Self-pay | Admitting: Pharmacist

## 2021-01-24 ENCOUNTER — Telehealth: Payer: Self-pay

## 2021-01-24 NOTE — Telephone Encounter (Addendum)
3 boxes of Jardiance 25 mg for a total of 21 tabs handed to patient by Dr. Collene Gobble. Instructions to take 1 tab daily before breakfast written on bag prepared by Ellis Savage, and discussed with patient.

## 2021-01-24 NOTE — Telephone Encounter (Signed)
Returned pt phone call  Pt needs jardiance samples due to copay being high at pharmacy. Pharmacy told pt her insurance was in the "catastrophic category". We attempted to do PAP with Jardiance back in Sept but pt never rec'd LIS/Extra Help denial letter from social security. I will provide a few weeks worth of samples to get her into the new year.  Medication Samples have been provided to the patient.  Drug name: JARDIANCE       Strength: 25MG         Qty: 21 TABS  LOT: [id number]  Exp.Date: 09/2021  Dosing instructions: TAKE 1 TABLET BEFORE BREAKFAST.   Talbert Cage Graysyn Bache 1:08 PM 01/24/2021

## 2021-02-08 ENCOUNTER — Other Ambulatory Visit: Payer: Self-pay | Admitting: Internal Medicine

## 2021-02-13 MED ORDER — EMPAGLIFLOZIN 25 MG PO TABS: 25.0000 mg | ORAL_TABLET | Freq: Every day | ORAL | 0 refills | Status: AC

## 2021-02-13 NOTE — Addendum Note (Signed)
Addended by: Dorian Pod A on: 02/13/2021 04:21 PM   Modules accepted: Orders

## 2021-02-14 NOTE — Telephone Encounter (Signed)
Called pt - stated she doesn't take Valium too often but during this time of year, she definitely needs it. Next appt scheduled 03/01/21 with PCP.

## 2021-02-16 ENCOUNTER — Encounter: Payer: Self-pay | Admitting: Pharmacist

## 2021-02-28 ENCOUNTER — Encounter: Payer: Self-pay | Admitting: Internal Medicine

## 2021-02-28 DIAGNOSIS — D696 Thrombocytopenia, unspecified: Secondary | ICD-10-CM | POA: Insufficient documentation

## 2021-02-28 HISTORY — DX: Thrombocytopenia, unspecified: D69.6

## 2021-02-28 NOTE — Assessment & Plan Note (Signed)
Resolved - BMI < 30

## 2021-02-28 NOTE — Progress Notes (Addendum)
Interim visits since last [facility] OV: Two weeks ago had a steroid shot in R thumb, trigger ED 01/09/21 Influenza, treated ED 12/13/20, hyperglycemia, left w/o being seen 10/26/20, AWV in the [facility] Has not seen prior PCP Dr Daryll Drown since 02/08/20 though several my chart communications, and has had visits with other providers.  Alyssa Haynes is 76 yo established patient in [facility] and is transitioning to me as new PCP as she requested to be under a geriatrician's care.  When queried, she reported that there have been a number of instances with various doctors when she felt her symptoms were not being taken seriously, or were being passed off as "just related to age".  Her thinking was that if aging was causing so many new problems, she should probably be evaluated by an aging specialist.  She emphasizes that she has nothing but praise for Dr. Daryll Drown, her former PCP.  I know she has been under excellent care.  The concern she raises today are: 1.  Chronic (began about a year ago) left side discomfort in the mid axillary line: Positional, occurs after moving from a L side leaning or L side laying position.  Lasts for a while until she moves around a bit.  No pain with full breath, cough, or laugh. Feels like it's deeper than chest wall.  Non radiating, no associated dyspnea or nausea.  Has attacks of hard coughing during episodes of recurrent bronchitis, though doesn't recall a particular sudden pain with cough, nor an injury.  2.   Follow-up of cirrhosis  3.  Anxiety:  she feels like it is hereditary, stays anxious.  Uses self talk.  The valium is her security - she can usually talk herself down.  Had a therapist for over a year, just started an 8 wk therapy through [facility] " [facility] Program" - therapist was very nice.  She doesn't want to revisit the old traumas.  Valium for years.  Just gets a few, keeps them for a long time without using.  More of a psychological crutch.  At one point was on Effexor, and another  couple of meds she doesn't recall, but felt "ditzy" and not herself.  "I didn't like the feeling and stopped taking".  Felt much better and more aware afterward.    4.  Leg pain: - has been evaluated several times, diagnosis has been elusive.  5.  Fatigue:  "I just can't keep up with the grands like I once could, I feel tired all the time.  I don't do much. No positional dizziness, no dyspnea.  Health maintenance: Due for mammogram - will order Shots? Plans to get the Covid booster.  Advised to get flu shot.    Colonoscopy 2021 nml  Patient Active Problem List   Diagnosis Date Noted   Trigger finger of right thumb 03/01/2021   Osteoarthritis of right hand 03/01/2021   Thrombocytopenia ([place]) 02/28/2021   Diverticular disease of colon, incidental, asymptomatic 07/10/2020   Cirrhosis, non-alcoholic ([place]) 76/81/1572   Leg cramps/leg pain (documentation is under various problems) 09/18/2019   Osteopenia 11/05/2017   Generalized anxiety disorder with panic attacks 08/22/2017   Osteoarthritis of left hip 07/23/2017   Mixed incontinence urge and stress 09/25/2016   Benign ovarian cyst 07/10/2016   Healthcare maintenance 04/01/2016   Chronic venous insufficiency 03/04/2014   Intrinsic asthma 09/02/2008   Type 2 diabetes mellitus without complications ([place]) 62/04/5595   Hyperlipidemia associated with type 2 diabetes mellitus ([place]) 03/12/2006   Essential hypertension  03/12/2006   GERD (gastroesophageal reflux disease) 12/28/2005    Current Outpatient Medications:    Apple Cid Vn-Grn Tea-Bit Or-Cr (APPLE CIDER VINEGAR PLUS) TABS, Take as stated on bottle, Disp: 60 tablet, Rfl: 0   Ascorbic Acid (VITAMIN C PO), Take by mouth. 1000 mg po, Disp: , Rfl:    atorvastatin (LIPITOR) 20 MG tablet, Take 1 tablet (20 mg total) by mouth daily., Disp: 90 tablet, Rfl: 3   Biotin 1 MG CAPS, Take 1 mg by mouth daily., Disp: 30 capsule, Rfl: 0   Blood Glucose Monitoring Suppl (ACCU-CHEK AVIVA PLUS) w/Device  KIT, Use to check blood sugar 2 times daily before meals. diag code E11.9. noninsulin dependent, Disp: 1 kit, Rfl: 0   cetirizine (ZYRTEC ALLERGY) 10 MG tablet, Take 1 tablet (10 mg total) by mouth daily., Disp: 90 tablet, Rfl: 3   Cholecalciferol (VITAMIN D3 PO), Take by mouth., Disp: , Rfl:    diazepam (VALIUM) 5 MG tablet, TAKE 1/2 TO 1 (ONE-HALF TO ONE) TABLET BY MOUTH EVERY 12 HOURS AS NEEDED FOR ANXIETY, Disp: 5 tablet, Rfl: 0   Elderberry 575 MG/5ML SYRP, Take 1 drop by mouth daily., Disp: , Rfl:    empagliflozin (JARDIANCE) 25 MG TABS tablet, Take 1 tablet (25 mg total) by mouth daily before breakfast., Disp: 21 tablet, Rfl: 0   ferrous sulfate 325 (65 FE) MG tablet, Take 1 tablet (325 mg total) by mouth at bedtime., Disp: 90 tablet, Rfl: 3   fluticasone (FLOVENT HFA) 44 MCG/ACT inhaler, Inhale 1 puff into the lungs 2 (two) times daily., Disp: 1 each, Rfl: 2   glucose blood (ACCU-CHEK AVIVA PLUS) test strip, Use to check blood sugar 2 times daily. diag code E11.9. noninsulin dependent, Disp: 200 each, Rfl: 1   Magnesium 400 MG CAPS, Take by mouth daily., Disp: , Rfl:    metFORMIN (GLUCOPHAGE) 500 MG tablet, Take 1 tablet (500 mg total) by mouth 2 (two) times daily with a meal., Disp: 180 tablet, Rfl: 3   Multiple Vitamin (MULTIVITAMIN) LIQD, Take 5 mLs by mouth daily., Disp: , Rfl:    olmesartan (BENICAR) 20 MG tablet, Take 1 tablet (20 mg total) by mouth daily., Disp: 90 tablet, Rfl: 1  BP (!) 141/70    Pulse 76    Temp 98.2 F (36.8 C) (Oral)    Ht _0  (1.676 m)    Wt 173 lb 3.2 oz (78.6 kg)    SpO2 99%    BMI 27.96 kg/m   Exam:  Attractive and well spoken woman, comfortable-appearing.  R hand with minimally enlarged DIPs of fingers 2-4.  Thumb with mild tenderness of PIP, no catching of flexor tendon.  Remainder of exam will occur at next visit.   Assessment and plan (see also problem based documenting):    Visit today primarily for acquaintance and to better understand her primary  concerns.  We will get together in 2 to 3 months to continue the conversations and make decisions about whether or not to add a GLP-1 agonist, whether or not her BP regimen needs to be adjusted, whether she would be interested in beginning an SSRI such as low-dose sertraline and transitioning from the long-acting Valium to a shorter acting as needed benzodiazepine.  We will order surveillance right upper quadrant ultrasound and check liver test today.  Due for lipid panel, BMP, CBC.  She is very interested in an exercise program-we will refer her to a [facility] based provider exercise program

## 2021-03-01 ENCOUNTER — Encounter: Payer: Self-pay | Admitting: Internal Medicine

## 2021-03-01 ENCOUNTER — Ambulatory Visit (INDEPENDENT_AMBULATORY_CARE_PROVIDER_SITE_OTHER): Payer: Medicare Other | Admitting: Internal Medicine

## 2021-03-01 VITALS — BP 141/70 | HR 76 | Temp 98.2°F | Ht 66.0 in | Wt 173.2 lb

## 2021-03-01 DIAGNOSIS — F411 Generalized anxiety disorder: Secondary | ICD-10-CM

## 2021-03-01 DIAGNOSIS — J45909 Unspecified asthma, uncomplicated: Secondary | ICD-10-CM

## 2021-03-01 DIAGNOSIS — M1612 Unilateral primary osteoarthritis, left hip: Secondary | ICD-10-CM | POA: Diagnosis not present

## 2021-03-01 DIAGNOSIS — E669 Obesity, unspecified: Secondary | ICD-10-CM | POA: Diagnosis not present

## 2021-03-01 DIAGNOSIS — M19041 Primary osteoarthritis, right hand: Secondary | ICD-10-CM | POA: Diagnosis not present

## 2021-03-01 DIAGNOSIS — N83209 Unspecified ovarian cyst, unspecified side: Secondary | ICD-10-CM

## 2021-03-01 DIAGNOSIS — F410 Panic disorder [episodic paroxysmal anxiety] without agoraphobia: Secondary | ICD-10-CM

## 2021-03-01 DIAGNOSIS — K5730 Diverticulosis of large intestine without perforation or abscess without bleeding: Secondary | ICD-10-CM | POA: Diagnosis not present

## 2021-03-01 DIAGNOSIS — E1169 Type 2 diabetes mellitus with other specified complication: Secondary | ICD-10-CM | POA: Diagnosis not present

## 2021-03-01 DIAGNOSIS — Z1231 Encounter for screening mammogram for malignant neoplasm of breast: Secondary | ICD-10-CM

## 2021-03-01 DIAGNOSIS — D696 Thrombocytopenia, unspecified: Secondary | ICD-10-CM | POA: Diagnosis not present

## 2021-03-01 DIAGNOSIS — K7460 Unspecified cirrhosis of liver: Secondary | ICD-10-CM

## 2021-03-01 DIAGNOSIS — R252 Cramp and spasm: Secondary | ICD-10-CM

## 2021-03-01 DIAGNOSIS — M65311 Trigger thumb, right thumb: Secondary | ICD-10-CM

## 2021-03-01 DIAGNOSIS — E119 Type 2 diabetes mellitus without complications: Secondary | ICD-10-CM

## 2021-03-01 DIAGNOSIS — Z0000 Encounter for general adult medical examination without abnormal findings: Secondary | ICD-10-CM

## 2021-03-01 DIAGNOSIS — E785 Hyperlipidemia, unspecified: Secondary | ICD-10-CM

## 2021-03-01 DIAGNOSIS — I10 Essential (primary) hypertension: Secondary | ICD-10-CM

## 2021-03-01 LAB — GLUCOSE, CAPILLARY: Glucose-Capillary: 178 mg/dL — ABNORMAL HIGH (ref 70–99)

## 2021-03-01 LAB — POCT GLYCOSYLATED HEMOGLOBIN (HGB A1C): Hemoglobin A1C: 8.8 % — AB (ref 4.0–5.6)

## 2021-03-01 NOTE — Assessment & Plan Note (Addendum)
Two days ago was getting popping sensations in the quads; notices a sharp burning pain down the L anterior quad,   and today feels it extending further down into the anterior lower leg 1/2 down; If she massages leg and moves it, it feels a bit better.   There is no diagnosis to her knowledge.  I'll need to review chart and examine at next visit.  THis is a fairly chronic problem.

## 2021-03-01 NOTE — Assessment & Plan Note (Signed)
A1c today 8.8. Much is dietary.  Recommended reducing desserts (difficult, as her husband enjoys desserts and they are frequently in the home), beginning [facility], and adding GLP1 (I'll discuss the med change with her at next visit, planned soon). Investigate eye evaluation hx and complete foot exam next visit also.

## 2021-03-01 NOTE — Assessment & Plan Note (Signed)
Asymptomatic.  Check liver tests today. Begin routine [place] surveillance with semiannual [place].  Discussed with patient who agrees.  While not overweight, she may benefit from a GLP1 agonist for this and for DM2 control.

## 2021-03-01 NOTE — Assessment & Plan Note (Signed)
"  I've always been this way, I worry all the time, and I think it's hereditary because I grew up in an anxious house and I can see it in my kids also".  Lately has been successful in calming herself without use of prn Valium (which she's had available for infrequent use for years), and recognizes that the medicine is more of a mental security than necessary.  She has just begun an insurance-provided mental health program.  Doesn't want to revisit traumas of the past, but does want to work on healthy coping.  Poor experience with Effexor and perhaps other meds in the past.  Would be open to trying a medication.  Discuss next visit.

## 2021-03-01 NOTE — Assessment & Plan Note (Signed)
Not at goal (< 130) though it was a couple of months ago on same regimen.  We didn't have an opportunity to recheck before she left; no changes today.

## 2021-03-01 NOTE — Assessment & Plan Note (Signed)
On statin tolerated well.  Lipid panel today.

## 2021-03-01 NOTE — Assessment & Plan Note (Signed)
Most recent injection about a year ago, no further pain - doing well!

## 2021-03-01 NOTE — Patient Instructions (Signed)
Wonderful to meet you today!  I enjoyed our conversation and look forward to working with you on your healthcare goals.   Today we visited about various topics, updated your blood work, and I have ordered an ultrasound and mammogram.    Let's get together in 1-2 months to continue our conversation and complete your physical.  Take care and stay well!  Dr. Jimmye Norman

## 2021-03-02 ENCOUNTER — Telehealth: Payer: Self-pay

## 2021-03-02 NOTE — Telephone Encounter (Signed)
Called pt reference referral to [facility] program to pt. Interested in participating.  Prefers daytime, [place] location is okay (lives closest to [facility]) Advised will call her as soon as the date and time of class is identified at [facility].

## 2021-03-03 LAB — CMP14 + ANION GAP
ALT: 54 IU/L — ABNORMAL HIGH (ref 0–32)
AST: 66 IU/L — ABNORMAL HIGH (ref 0–40)
Albumin/Globulin Ratio: 1.6 (ref 1.2–2.2)
Albumin: 4.6 g/dL (ref 3.7–4.7)
Alkaline Phosphatase: 125 IU/L — ABNORMAL HIGH (ref 44–121)
Anion Gap: 14 mmol/L (ref 10.0–18.0)
BUN/Creatinine Ratio: 11 — ABNORMAL LOW (ref 12–28)
BUN: 9 mg/dL (ref 8–27)
Bilirubin Total: 0.6 mg/dL (ref 0.0–1.2)
CO2: 23 mmol/L (ref 20–29)
Calcium: 9.7 mg/dL (ref 8.7–10.3)
Chloride: 103 mmol/L (ref 96–106)
Creatinine, Ser: 0.81 mg/dL (ref 0.57–1.00)
Globulin, Total: 2.8 g/dL (ref 1.5–4.5)
Glucose: 168 mg/dL — ABNORMAL HIGH (ref 70–99)
Potassium: 4.2 mmol/L (ref 3.5–5.2)
Sodium: 140 mmol/L (ref 134–144)
Total Protein: 7.4 g/dL (ref 6.0–8.5)
eGFR: 77 mL/min/{1.73_m2} (ref >59)

## 2021-03-03 LAB — CBC
Hematocrit: 40.4 % (ref 34.0–46.6)
Hemoglobin: 13.1 g/dL (ref 11.1–15.9)
MCH: 29.6 pg (ref 26.6–33.0)
MCHC: 32.4 g/dL (ref 31.5–35.7)
MCV: 91 fL (ref 79–97)
Platelets: 140 10*3/uL — ABNORMAL LOW (ref 150–450)
RBC: 4.42 x10E6/uL (ref 3.77–5.28)
RDW: 13.4 % (ref 11.7–15.4)
WBC: 4.2 10*3/uL (ref 3.4–10.8)

## 2021-03-03 LAB — LIPID PANEL
Chol/HDL Ratio: 2.1 ratio (ref 0.0–4.4)
Cholesterol, Total: 149 mg/dL (ref 100–199)
HDL: 71 mg/dL (ref >39)
LDL Chol Calc (NIH): 63 mg/dL (ref 0–99)
Triglycerides: 76 mg/dL (ref 0–149)
VLDL Cholesterol Cal: 15 mg/dL (ref 5–40)

## 2021-03-06 ENCOUNTER — Ambulatory Visit
Admission: RE | Admit: 2021-03-06 | Discharge: 2021-03-06 | Disposition: A | Discharge status: Home / Self Care | Payer: Medicare Other | Source: Ambulatory Visit | Attending: Internal Medicine | Admitting: Internal Medicine

## 2021-03-06 DIAGNOSIS — K7460 Unspecified cirrhosis of liver: Secondary | ICD-10-CM | POA: Diagnosis not present

## 2021-03-15 ENCOUNTER — Ambulatory Visit
Admission: RE | Admit: 2021-03-15 | Discharge: 2021-03-15 | Disposition: A | Discharge status: Home / Self Care | Payer: Medicare Other | Source: Ambulatory Visit | Attending: Internal Medicine | Admitting: Internal Medicine

## 2021-03-15 ENCOUNTER — Other Ambulatory Visit: Payer: Self-pay

## 2021-03-15 DIAGNOSIS — Z1231 Encounter for screening mammogram for malignant neoplasm of breast: Secondary | ICD-10-CM | POA: Diagnosis not present

## 2021-03-17 ENCOUNTER — Other Ambulatory Visit: Payer: Self-pay | Admitting: Internal Medicine

## 2021-03-17 DIAGNOSIS — Z794 Long term (current) use of insulin: Secondary | ICD-10-CM

## 2021-03-17 DIAGNOSIS — E119 Type 2 diabetes mellitus without complications: Secondary | ICD-10-CM

## 2021-03-17 DIAGNOSIS — E1169 Type 2 diabetes mellitus with other specified complication: Secondary | ICD-10-CM

## 2021-03-17 DIAGNOSIS — E785 Hyperlipidemia, unspecified: Secondary | ICD-10-CM

## 2021-03-30 ENCOUNTER — Telehealth: Payer: Self-pay

## 2021-03-30 NOTE — Telephone Encounter (Signed)
Call to patient reference next class at [facility] on 04/09/21 M/W 1030a to 1145am Confirmed she wants to participate.  Intake scheduled for 04/02/21 915am

## 2021-04-02 NOTE — Progress Notes (Signed)
[employer] PREP Evaluation  Patient Details  Name: Alyssa Haynes MRN: [id number] Date of Birth: May 25, 1945 Age: 76 y.o. PCP: Angelica Pou, MD  Vitals:   04/02/21 0930  BP: 132/70  Pulse: 63  SpO2: 98%  Weight: 174 lb 12.8 oz (79.3 kg)     [facility] Eval - 04/02/21 1000       [facility] "PREP" Location   [facility] "PREP" Location [facility]      Referral    Referring Provider Dr Jimmye Norman    Reason for referral Diabetes;High Cholesterol;Hypertension;Inactivity    Program Start Date 04/09/21   M/W 1030am to 1145am x 12 wks     Measurement   Waist Circumference 40 inches    Hip Circumference 42 inches    Body fat 40.3 percent      Information for Trainer   Goals Get stronger, get more flexible, be able to move around easier    Current Exercise none    Orthopedic Concerns OA hips    Pertinent Medical History DM2, HTN, High chol, asthma, fatty liver    Current Barriers none    Restrictions/Precautions Diabetic snack before exercise   Eat breakfast   Medications that affect exercise Medication causing dizziness/drowsiness;Asthma inhaler      Timed Up and Go (TUGS)   Timed Up and Go Low risk <9 seconds      Mobility and Daily Activities   I find it easy to walk up or down two or more flights of stairs. 1    I have no trouble taking out the trash. 3    I do housework such as vacuuming and dusting on my own without difficulty. 4    I can easily lift a gallon of milk (8lbs). 3    I can easily walk a mile. 3    I have no trouble reaching into high cupboards or reaching down to pick up something from the floor. 1    I do not have trouble doing out-door work such as Armed forces logistics/support/administrative officer, raking leaves, or gardening. 3      Mobility and Daily Activities   I feel younger than my age. 1    I feel independent. 3    I feel energetic. 1    I live an active life.  1    I feel strong. 1    I feel healthy. 3    I feel active as other people my age. 1      How fit and strong are you.    Fit and Strong Total Score 29            Past Medical History:  Diagnosis Date   Anxiety attack 08/22/2017   Arthritis    Asthma    Benign ovarian cyst 07/10/2016   Bronchitis 06/22/2009   Qualifier: Diagnosis of  By: Eyvonne Mechanic MD, Vijay     Cataract    Cirrhosis ([place])    Diabetes mellitus    GERD (gastroesophageal reflux disease)    Hyperlipidemia    Hypertension    Osteoporosis    Thrombocytopenia ([place]) 02/28/2021   Associated with cirrhosis   Past Surgical History:  Procedure Laterality Date   [place]   CHOLECYSTECTOMY  2006   COLONOSCOPY     8-9- years ago- unsure of MD   FRACTURE SURGERY  1992   lEFT ANKLE orif   plantar fasciitis Right    release   TUBAL LIGATION  1988   Social History  Tobacco Use  Smoking Status Former   Types: Cigarettes   Quit date: 02/19/1995   Years since quitting: 26.1  Smokeless Tobacco Never    Barnett Hatter 04/02/2021, 10:20 AM

## 2021-04-23 ENCOUNTER — Other Ambulatory Visit: Payer: Self-pay

## 2021-04-27 ENCOUNTER — Other Ambulatory Visit: Payer: Self-pay | Admitting: Internal Medicine

## 2021-04-27 DIAGNOSIS — E119 Type 2 diabetes mellitus without complications: Secondary | ICD-10-CM

## 2021-05-01 DIAGNOSIS — M25572 Pain in left ankle and joints of left foot: Secondary | ICD-10-CM | POA: Diagnosis not present

## 2021-05-03 ENCOUNTER — Telehealth: Payer: Self-pay

## 2021-05-03 NOTE — Telephone Encounter (Signed)
RTC to patient stated that she had spoken with Dr. Daryll Drown before about getting some for her Incontinence.  Patient stated that when she gets the urge she has to go then.  Hs not made it to the bathroom.  Patient is requesting a pill to help with the urge and immediately having to void. ?

## 2021-05-03 NOTE — Telephone Encounter (Signed)
Pt is requesting a call back ... she stated that at her last OV  Dr Jimmye Norman had asked if she was doing ok with incontinence and she told her she was getting by .. pt stated that now she is not able to hold her urine and is wanting to have her PCP get her some supplies .. he next appt with PCP is 5/18  ?

## 2021-05-07 NOTE — Progress Notes (Signed)
[employer] PREP Weekly Session ? ?Patient Details  ?Name: Alyssa Haynes ?MRN: [id number] ?Date of Birth: 03-27-1945 ?Age: 76 y.o. ?PCP: Angelica Pou, MD ? ?Vitals:  ? 05/07/21 1615  ?Weight: 175 lb (79.4 kg)  ? ? ? [employer] Weekly seesion - 05/07/21 1600   ? ?  ? [facility] "PREP" Location  ? [facility] "PREP[facility] Location [place]   ?  ? Weekly Session  ? Topic Discussed Health habits   ? Minutes exercised this week 10 minutes   ? Classes attended to date 5   ? ?  ?  ? ?  ? ? ?Barnett Hatter ?05/07/2021, 4:16 PM ? ? ?

## 2021-05-15 DIAGNOSIS — H524 Presbyopia: Secondary | ICD-10-CM | POA: Diagnosis not present

## 2021-05-15 DIAGNOSIS — E119 Type 2 diabetes mellitus without complications: Secondary | ICD-10-CM | POA: Diagnosis not present

## 2021-05-15 DIAGNOSIS — H25813 Combined forms of age-related cataract, bilateral: Secondary | ICD-10-CM | POA: Diagnosis not present

## 2021-05-15 DIAGNOSIS — H40013 Open angle with borderline findings, low risk, bilateral: Secondary | ICD-10-CM | POA: Diagnosis not present

## 2021-05-15 DIAGNOSIS — H35033 Hypertensive retinopathy, bilateral: Secondary | ICD-10-CM | POA: Diagnosis not present

## 2021-05-29 ENCOUNTER — Other Ambulatory Visit: Payer: Self-pay | Admitting: Internal Medicine

## 2021-06-04 NOTE — Progress Notes (Signed)
[facility] PRE[facility] Weekly Session ? ?Patient Details  ?Name: Alyssa Haynes ?MRN: [id number] ?Date of Birth: 03/09/1945 ?Age: 76 y.o. ?PCP: Angelica Pou, MD ? ?Vitals:  ? 06/04/21 1359  ?Weight: 176 lb (79.8 kg)  ? ? ? [facility] - 06/04/21 1400   ? ?  ? [facility]  ? [facility] [place]   ?  ? Weekly Session  ? Topic Discussed Other   portion size, label reading  ? Minutes exercised this week 55 minutes   ? Classes attended to date 79   ? ?  ?  ? ?  ? ? ?Barnett Hatter ?06/04/2021, 2:00 PM ? ? ?

## 2021-07-05 ENCOUNTER — Ambulatory Visit (INDEPENDENT_AMBULATORY_CARE_PROVIDER_SITE_OTHER): Payer: Medicare Other | Admitting: Internal Medicine

## 2021-07-05 ENCOUNTER — Other Ambulatory Visit: Payer: Self-pay | Admitting: Internal Medicine

## 2021-07-05 VITALS — BP 131/64 | HR 70 | Temp 98.3°F | Ht 66.0 in | Wt 174.6 lb

## 2021-07-05 DIAGNOSIS — E119 Type 2 diabetes mellitus without complications: Secondary | ICD-10-CM | POA: Diagnosis not present

## 2021-07-05 DIAGNOSIS — F410 Panic disorder [episodic paroxysmal anxiety] without agoraphobia: Secondary | ICD-10-CM

## 2021-07-05 DIAGNOSIS — R4189 Other symptoms and signs involving cognitive functions and awareness: Secondary | ICD-10-CM

## 2021-07-05 DIAGNOSIS — Z7984 Long term (current) use of oral hypoglycemic drugs: Secondary | ICD-10-CM

## 2021-07-05 DIAGNOSIS — I10 Essential (primary) hypertension: Secondary | ICD-10-CM | POA: Diagnosis not present

## 2021-07-05 DIAGNOSIS — Z87891 Personal history of nicotine dependence: Secondary | ICD-10-CM | POA: Diagnosis not present

## 2021-07-05 DIAGNOSIS — N3941 Urge incontinence: Secondary | ICD-10-CM

## 2021-07-05 DIAGNOSIS — K7460 Unspecified cirrhosis of liver: Secondary | ICD-10-CM | POA: Diagnosis not present

## 2021-07-05 DIAGNOSIS — F411 Generalized anxiety disorder: Secondary | ICD-10-CM

## 2021-07-05 LAB — POCT GLYCOSYLATED HEMOGLOBIN (HGB A1C): Hemoglobin A1C: 10.2 % — AB (ref 4.0–5.6)

## 2021-07-05 LAB — GLUCOSE, CAPILLARY: Glucose-Capillary: 165 mg/dL — ABNORMAL HIGH (ref 70–99)

## 2021-07-05 MED ORDER — TRULICITY 0.75 MG/0.5ML ~~LOC~~ SOAJ: 0.7500 mg | SUBCUTANEOUS | 4 refills | Status: DC

## 2021-07-05 NOTE — Assessment & Plan Note (Addendum)
Continues to have irritability.  Rare use of valium for sense of impending panic.  She recalls having tried Effexor, Zoloft, and Lexapro in the past, and remembers no improvement but also experiencing brain fog side effect.  SHe stopped it on her own, with resolution of her side effects.  She isn't yet interested in medication though we will continue to discuss.  Benefiting from behavioral health referral.  Recognizing triggers. Spousal relationship is strained. She has recently resumed reading, which is a positive.

## 2021-07-05 NOTE — Assessment & Plan Note (Signed)
Occurs only at night (1x nocturia) and is managed by pad.  No problems during the day.  She isn't yet interested in a medication, though options were discussed.

## 2021-07-05 NOTE — Assessment & Plan Note (Signed)
Asymptomatic.  No HCC on [place].  Discussed weight loss as most important step in therapy.  Poor eating habits have been interfering with her goals.  SHe will recommit to exercise and healthy food choices.  CZG4H Trulicity (on [facility] formulary; Ozempic isn't) will be prescribed.  She is ok to take occasional (few times a week) tylenol and NSAIDs for her OA joint pain. Discussed how such medicines must be monitored carefully, as high doses would be contraindicated in chronic liver disease.

## 2021-07-05 NOTE — Progress Notes (Signed)
Alyssa Haynes is here for 18M f/u of DM2, HTN, NAFLD and mobility concerns.  At last visit, she was referred to a sponsored program at the [facility], received mammogram (nml), and liver US (heterogenous, cw cirrhosis).  Goals are to lose weight, improve activity level and mobility, and control chronic conditions to minimize risk of decline in health.    Today we will revisit her anxiety, assess progress with lifestyle changes, recheck liver tests and A1C,  and address urinary incontinence.    SHe is doing well.   She is caregiver for relative receiving chemo which is preventing attending some sessions [facility], but she enjoyed the program very much and has noticeably improved in strength and flexibility.  She did have a fall in the yard (tripped over a stump), and noticed that not only did she not get hurt , but she found it much easier to get up from the ground than before she attended the program.  She is pleased.  Starting a chair yoga class at [facility] next week!    Behavioral therapy has been tried, was beneficial but she hasn't allowed it to progress to the deep concerns.  She has been told by her family hat she has been irritable, snappy, mean.    Other problems in problem list below.  Patient Active Problem List   Diagnosis Date Noted   Cognitive decline 07/05/2021   Trigger finger of right thumb 03/01/2021   Osteoarthritis of right hand 03/01/2021   Thrombocytopenia ([place]) 02/28/2021   Diverticular disease of colon, incidental, asymptomatic 07/10/2020   Cirrhosis, non-alcoholic ([place]) 61/60/7371   Leg cramps/leg pain (documentation is under various problems) 09/18/2019   Osteopenia 11/05/2017   Generalized anxiety disorder with panic attacks 08/22/2017   Osteoarthritis of left hip 07/23/2017   Urinary incontinence, urge (nocturnal) 09/25/2016   Benign ovarian cyst 07/10/2016   Chronic venous insufficiency 03/04/2014   Intrinsic asthma 09/02/2008   Type 2 diabetes  mellitus without complications ([place]) 08/14/9483   Hyperlipidemia associated with type 2 diabetes mellitus ([place]) 03/12/2006   Essential hypertension 03/12/2006   Current Outpatient Medications:    Apple Cid Vn-Grn Tea-Bit Or-Cr (APPLE CIDER VINEGAR PLUS) TABS, Take as stated on bottle, Disp: 60 tablet, Rfl: 0   Ascorbic Acid (VITAMIN C PO), Take by mouth. 1000 mg po, Disp: , Rfl:    atorvastatin (LIPITOR) 20 MG tablet, Take 1 tablet by mouth once daily, Disp: 90 tablet, Rfl: 3   Biotin 1 MG CAPS, Take 1 mg by mouth daily., Disp: 30 capsule, Rfl: 0   Blood Glucose Monitoring Suppl (ACCU-CHEK AVIVA PLUS) w/Device KIT, Use to check blood sugar 2 times daily before meals. diag code E11.9. noninsulin dependent, Disp: 1 kit, Rfl: 0   cetirizine (ZYRTEC ALLERGY) 10 MG tablet, Take 1 tablet (10 mg total) by mouth daily., Disp: 90 tablet, Rfl: 3   Cholecalciferol (VITAMIN D3 PO), Take by mouth., Disp: , Rfl:    diazepam (VALIUM) 5 MG tablet, TAKE 1/2 TO 1 (ONE-HALF TO ONE) TABLET BY MOUTH EVERY 12 HOURS AS NEEDED FOR ANXIETY, Disp: 5 tablet, Rfl: 0   Elderberry 575 MG/5ML SYRP, Take 1 drop by mouth daily., Disp: , Rfl:    empagliflozin (JARDIANCE) 25 MG TABS tablet, Take 1 tablet (25 mg total) by mouth daily before breakfast., Disp: 21 tablet, Rfl: 0   ferrous sulfate 325 (65 FE) MG tablet, Take 1 tablet (325 mg total) by mouth at bedtime., Disp: 90 tablet, Rfl: 3  fluticasone (FLOVENT HFA) 44 MCG/ACT inhaler, Inhale 1 puff into the lungs 2 (two) times daily., Disp: 1 each, Rfl: 2   glucose blood (ACCU-CHEK AVIVA PLUS) test strip, Use to check blood sugar 2 times daily. diag code E11.9. noninsulin dependent, Disp: 200 each, Rfl: 1   Magnesium 400 MG CAPS, Take by mouth daily., Disp: , Rfl:    metFORMIN (GLUCOPHAGE) 500 MG tablet, TAKE 1 TABLET BY MOUTH TWICE DAILY WITH MEALS, Disp: 180 tablet, Rfl: 3   Multiple Vitamin (MULTIVITAMIN) LIQD, Take 5 mLs by mouth daily., Disp: , Rfl:    olmesartan (BENICAR)  20 MG tablet, Take 1 tablet (20 mg total) by mouth daily., Disp: 90 tablet, Rfl: 1    BP 131/64 (BP Location: Right Arm, Patient Position: Sitting, Cuff Size: Small)   Pulse 70   Temp 98.3 F (36.8 C) (Oral)   Ht $R'5\' 6"'rc$  (1.676 m)   Wt 174 lb 9.6 oz (79.2 kg)   SpO2 100%   BMI 28.18 kg/m  Took all medicines this morning as directed. Exam:  Well appearance,  habitus with increased waist to hip ratio. Foot exam - excellent condition of skin, no lesions, no toe deformities, dp pulses 1+ bilateral, sensation intact.  Able to arise from chair without use of arms, gait normal, smooth with normal pace.  Mood euthymic.  Speech cadence normal, fluent, no word findings difficulties, content full, no repetition.  No tremor.  R hand with thickened and stiff MCPs and PIPs, particularly thumb.  L ankle supported with velco compressive wrap.  No dependent edema despite hx of venous insufficiency.  Assessment and plan: Cirrhosis, non-alcoholic (HCC) Asymptomatic.  No HCC on [place].  Discussed weight loss as most important step in therapy.  Poor eating habits have been interfering with her goals.  SHe will recommit to exercise and healthy food choices.  OFB5Z Trulicity (on [facility] formulary; Ozempic isn't) will be prescribed.  She is ok to take occasional (few times a week) tylenol and NSAIDs for her OA joint pain. Discussed how such medicines must be monitored carefully, as high doses would be contraindicated in chronic liver disease.  Essential hypertension BP at goal today.  Exercise and weight loss may help reduce her dependency on medication.  Type 2 diabetes mellitus without complications (HCC) W2H today 10.2, a significant increase.  Adherent to medications.   She has had difficulty with diet.  Admits to suboptimal eating habits, snacks frequently.  Has noticed that too much fruit increases her blood sugar. Loves cookies and chocolate.  Discussed GLP1 agonists (her daughter is on Ozempic and has had excellent  results). She agrees to try med class, trulicity available on [place]. She will work on her diet.  Foot exam normal.  Urinary incontinence, urge (nocturnal) Occurs only at night (1x nocturia) and is managed by pad.  No problems during the day.  She isn't yet interested in a medication, though options were discussed.    Generalized anxiety disorder with panic attacks Continues to have irritability.  Rare use of valium for sense of impending panic.  She recalls having tried Effexor, Zoloft, and Lexapro in the past, and remembers no improvement but also experiencing brain fog side effect.  SHe stopped it on her own, with resolution of her side effects.  She isn't yet interested in medication though we will continue to discuss.  Benefiting from behavioral health referral.  Recognizing triggers. Spousal relationship is strained. She has recently resumed reading, which is a positive.  Cognitive decline Family has commented on "meanness", irritability, snapping, limited patience.  Noticing forgetfulness; first words, losing track of thought and word finding challenges.  Doesn't "see" objects right in front of her when searching, such as eye glasses or a kitchen implement (recently couldn't locate the hand mixer in the kitchen which was very distressing, and ultimately found it in its correct spot, after having looked there several times).  Will eventually need to check B12 (on metformin) and TSH.  Vit D 26.2 one year ago.  We discussed how mood can interfere with optimal cognition, and that exercise, socializing, getting out of the house, and healthy eating habits are all lifestyle elements which can help optimize cognition.  At next visit will complete a MoCA.  Difficulty "finding"/recognizing objects in visual field is suggestive of a visuospatial component.    9M f/u, including MoCA, possible B12 and TSH.

## 2021-07-05 NOTE — Patient Instructions (Addendum)
Alyssa Haynes,  It was wonderful to see you again today.  We discussed many things, which we could have spent many hours on!  We rechecked your A1C today and will also recheck your liver tests.  We talked about starting a medicine to help with weight loss, liver disease, diabetes control.  You'll start by taking 0.75 mg once a week. We'll stay on that until next visit, but there is room to increase from there.  I'm glad to hear about your success with the exercise program, and thrilled that you're pursuing more activities at the [facility]!  We are discussed your mood and thinking.  Forde Dandy will continue.  TAke care and stay well, Dr. Jimmye Norman

## 2021-07-05 NOTE — Assessment & Plan Note (Addendum)
Family has commented on "meanness", irritability, snapping, limited patience.  Noticing forgetfulness; first words, losing track of thought and word finding challenges.  Doesn't "see" objects right in front of her when searching, such as eye glasses or a kitchen implement (recently couldn't locate the hand mixer in the kitchen which was very distressing, and ultimately found it in its correct spot, after having looked there several times).  Will eventually need to check B12 (on metformin) and TSH.  Vit D 26.2 one year ago.  We discussed how mood can interfere with optimal cognition, and that exercise, socializing, getting out of the house, and healthy eating habits are all lifestyle elements which can help optimize cognition.  At next visit will complete a MoCA.  Difficulty "finding"/recognizing objects in visual field is suggestive of a visuospatial component.

## 2021-07-05 NOTE — Assessment & Plan Note (Signed)
BP at goal today.  Exercise and weight loss may help reduce her dependency on medication.

## 2021-07-05 NOTE — Assessment & Plan Note (Signed)
A1c today 10.2, a significant increase.  Adherent to medications.   She has had difficulty with diet.  Admits to suboptimal eating habits, snacks frequently.  Has noticed that too much fruit increases her blood sugar. Loves cookies and chocolate.  Discussed GLP1 agonists (her daughter is on Ozempic and has had excellent results). She agrees to try med class, trulicity available on [place]. She will work on her diet.  Foot exam normal.

## 2021-07-06 LAB — HEPATIC FUNCTION PANEL
ALT: 41 IU/L — ABNORMAL HIGH (ref 0–32)
AST: 49 IU/L — ABNORMAL HIGH (ref 0–40)
Albumin: 4.5 g/dL (ref 3.7–4.7)
Alkaline Phosphatase: 118 IU/L (ref 44–121)
Bilirubin Total: 0.6 mg/dL (ref 0.0–1.2)
Bilirubin, Direct: 0.22 mg/dL (ref 0.00–0.40)
Total Protein: 7.4 g/dL (ref 6.0–8.5)

## 2021-07-09 NOTE — Progress Notes (Signed)
[facility] PREP Evaluation  Patient Details  Name: Alyssa Haynes MRN: [id number] Date of Birth: October 12, 1945 Age: 76 y.o. PCP: Angelica Pou, MD  Vitals:   07/09/21 0830  BP: 128/70  Pulse: 67  SpO2: 99%  Weight: 172 lb 9.6 oz (78.3 kg)     [facility] Eval - 07/09/21 1000       [facility] "PREP" Location   [facility] "PREP" Location [facility]      Referral    Referring Provider williams    Program Start Date --   date of last PREP class 07/02/21     Measurement   Waist Circumference 39.5 inches    Hip Circumference 44 inches    Body fat 41.3 percent      Information for Trainer   Goals MWF walk at park at 830am, morning pages every day      Mobility and Daily Activities   I find it easy to walk up or down two or more flights of stairs. 2    I have no trouble taking out the trash. 3    I do housework such as vacuuming and dusting on my own without difficulty. 4    I can easily lift a gallon of milk (8lbs). 2    I can easily walk a mile. 1    I have no trouble reaching into high cupboards or reaching down to pick up something from the floor. 2    I do not have trouble doing out-door work such as Armed forces logistics/support/administrative officer, raking leaves, or gardening. 2      Mobility and Daily Activities   I feel younger than my age. 1    I feel independent. 2    I feel energetic. 1    I live an active life.  1    I feel strong. 1    I feel healthy. 1    I feel active as other people my age. 1      How fit and strong are you.   Fit and Strong Total Score 24            Past Medical History:  Diagnosis Date   Anxiety attack 08/22/2017   Arthritis    Asthma    Benign ovarian cyst 07/10/2016   Bronchitis 06/22/2009   Qualifier: Diagnosis of  By: Eyvonne Mechanic MD, Vijay     Cataract    Cirrhosis ([place])    Diabetes mellitus    GERD (gastroesophageal reflux disease)    Hyperlipidemia    Hypertension    Osteoporosis    Thrombocytopenia ([place]) 02/28/2021   Associated with cirrhosis   Past Surgical  History:  Procedure Laterality Date   CESAREAN SECTION  1988   CHOLECYSTECTOMY  2006   COLONOSCOPY     8-9- years ago- unsure of MD   FRACTURE SURGERY  1992   lEFT ANKLE orif   plantar fasciitis Right    release   TUBAL LIGATION  1988   Social History   Tobacco Use  Smoking Status Former   Types: Cigarettes   Quit date: 02/19/1995   Years since quitting: 26.4  Smokeless Tobacco Never  Cardio march test: 247 to 274 Sit to stand: 7 to 11 Bicep curl: 19 to 16 (admitted she could do more) Still struggles with single leg balance, tandem is improved.  Attendance was low during class. Only attending 10 times and 6 of 12 educational sessions. Reports low mood, motivation and eating when not hungry,not sleeping  well. Wants to take class again. Does not want to take antidepressants at this time. Brainstormed schedule and things she is willing to do to improve things.  Encouraged walking and morning pages to help with structure. Asked she check in with me to report how often she is exercising and to ask her MD for another referral.    Barnett Hatter 07/09/2021, 10:33 AM

## 2021-07-31 ENCOUNTER — Telehealth: Payer: Self-pay | Admitting: *Deleted

## 2021-07-31 NOTE — Telephone Encounter (Signed)
Message sent to the front desk per pt: " I also need Dr. Jimmye Norman to contact me because my meds have gone up several hundred dollars and I need to know if there are alternatives for Jardiance and Trulicity.  Thank you" Thanks

## 2021-08-01 ENCOUNTER — Other Ambulatory Visit (HOSPITAL_COMMUNITY): Payer: Self-pay

## 2021-08-01 ENCOUNTER — Telehealth: Payer: Self-pay

## 2021-08-01 NOTE — Telephone Encounter (Signed)
Mailed trulicity application to pt's home.  PAP discussed in MyChart messages to patient.

## 2021-08-10 DIAGNOSIS — M65311 Trigger thumb, right thumb: Secondary | ICD-10-CM | POA: Diagnosis not present

## 2021-08-14 ENCOUNTER — Ambulatory Visit: Payer: Medicare Other | Admitting: Dermatology

## 2021-08-14 DIAGNOSIS — L729 Follicular cyst of the skin and subcutaneous tissue, unspecified: Secondary | ICD-10-CM | POA: Diagnosis not present

## 2021-08-22 ENCOUNTER — Telehealth: Payer: Self-pay

## 2021-08-22 ENCOUNTER — Other Ambulatory Visit (HOSPITAL_COMMUNITY): Payer: Self-pay

## 2021-08-22 NOTE — Telephone Encounter (Signed)
Mailed novo nordisk application for ozempic patient assistance to pt's home.

## 2021-09-10 ENCOUNTER — Encounter: Payer: Self-pay | Admitting: Dermatology

## 2021-09-10 NOTE — Progress Notes (Signed)
   Follow-Up Visit   Subjective  Alyssa Haynes is a 76 y.o. female who presents for the following: Skin Problem (Patient here for possible stitch in her chest from cyst removal in 2021.).  Check spot on chest which was removed 2 years ago Location:  Duration:  Quality:  Associated Signs/Symptoms: Modifying Factors:  Severity:  Timing: Context:   Objective  Well appearing patient in no apparent distress; mood and affect are within normal limits. mid chest I do not see any retained suture but there is full dermal 8 mm papule with some fibrosis.  Mid chest cyst , pervious removal 2021    A focused examination was performed including chest.. Relevant physical exam findings are noted in the Assessment and Plan.   Assessment & Plan    Cyst of skin mid chest  Lesion is bothersome to patient so she may schedule for surgical reexcision      I, Lavonna Monarch, MD, have reviewed all documentation for this visit.  The documentation on 09/10/21 for the exam, diagnosis, procedures, and orders are all accurate and complete.

## 2021-09-19 ENCOUNTER — Ambulatory Visit: Payer: Medicare Other | Admitting: Podiatry

## 2021-09-19 ENCOUNTER — Encounter: Payer: Self-pay | Admitting: Podiatry

## 2021-09-19 DIAGNOSIS — M79675 Pain in left toe(s): Secondary | ICD-10-CM | POA: Diagnosis not present

## 2021-09-19 DIAGNOSIS — B351 Tinea unguium: Secondary | ICD-10-CM | POA: Diagnosis not present

## 2021-09-19 DIAGNOSIS — Z794 Long term (current) use of insulin: Secondary | ICD-10-CM

## 2021-09-19 DIAGNOSIS — E113299 Type 2 diabetes mellitus with mild nonproliferative diabetic retinopathy without macular edema, unspecified eye: Secondary | ICD-10-CM

## 2021-09-19 DIAGNOSIS — M79674 Pain in right toe(s): Secondary | ICD-10-CM

## 2021-09-19 NOTE — Progress Notes (Signed)
This patient returns to my office for at risk foot care.  This patient requires this care by a professional since this patient will be at risk due to having type 2 diabetes and chronic venous insufficiency.  She denies any other acute complaints.  She would like to have the nails debrided as she is not able to do it herself.  General Appearance  Alert, conversant and in no acute stress.  Vascular  Dorsalis pedis and posterior tibial  pulses are palpable  bilaterally.  Capillary return is within normal limits  bilaterally. Temperature is within normal limits  bilaterally.  Neurologic  Senn-Weinstein monofilament wire test within normal limits  bilaterally. Muscle power within normal limits bilaterally.  Nails Thick disfigured discolored nails with subungual debris  from hallux to fifth toes bilaterally. No evidence of bacterial infection or drainage bilaterally.  Orthopedic  No limitations of motion  feet .  No crepitus or effusions noted.  No bony pathology or digital deformities noted.  HAV  B/L.  Hammer toes 2-5  B/L.  Skin  normotropic skin with no porokeratosis noted bilaterally except under the foreeft  B/L.  No redness pr callus formation forefeet  B/L.Marland Kitchen  No signs of infections or ulcers noted.     Dermatitis.Forefeet  B/L.   Consent was obtained for treatment procedures.  ROV.  Told her to use vaseline.  Prescirbed spenco insoles instead of her hard orthoses.   Return office visit      4 months                Told patient to return for periodic foot care and evaluation due to potential at risk complications.   Boneta Lucks D.P.M.

## 2021-11-12 ENCOUNTER — Other Ambulatory Visit: Payer: Self-pay | Admitting: Geriatric Medicine

## 2021-11-12 DIAGNOSIS — R109 Unspecified abdominal pain: Secondary | ICD-10-CM

## 2021-11-16 ENCOUNTER — Ambulatory Visit
Admission: RE | Admit: 2021-11-16 | Discharge: 2021-11-16 | Disposition: A | Discharge status: Home / Self Care | Payer: Medicare Other | Source: Ambulatory Visit | Attending: Geriatric Medicine | Admitting: Geriatric Medicine

## 2021-11-16 DIAGNOSIS — R109 Unspecified abdominal pain: Secondary | ICD-10-CM

## 2021-12-05 ENCOUNTER — Other Ambulatory Visit: Payer: Self-pay | Admitting: Student

## 2021-12-05 DIAGNOSIS — J45909 Unspecified asthma, uncomplicated: Secondary | ICD-10-CM

## 2021-12-25 ENCOUNTER — Encounter: Payer: Self-pay | Admitting: Physician Assistant

## 2021-12-25 ENCOUNTER — Other Ambulatory Visit (INDEPENDENT_AMBULATORY_CARE_PROVIDER_SITE_OTHER): Payer: Medicare Other

## 2021-12-25 ENCOUNTER — Ambulatory Visit (INDEPENDENT_AMBULATORY_CARE_PROVIDER_SITE_OTHER): Payer: Medicare Other | Admitting: Physician Assistant

## 2021-12-25 VITALS — BP 150/78 | HR 75 | Ht 66.0 in | Wt 175.6 lb

## 2021-12-25 DIAGNOSIS — K30 Functional dyspepsia: Secondary | ICD-10-CM | POA: Diagnosis not present

## 2021-12-25 DIAGNOSIS — K7460 Unspecified cirrhosis of liver: Secondary | ICD-10-CM

## 2021-12-25 DIAGNOSIS — R1012 Left upper quadrant pain: Secondary | ICD-10-CM

## 2021-12-25 LAB — CBC WITH DIFFERENTIAL/PLATELET
Basophils Absolute: 0 10*3/uL (ref 0.0–0.1)
Basophils Relative: 1.1 % (ref 0.0–3.0)
Eosinophils Absolute: 0.3 10*3/uL (ref 0.0–0.7)
Eosinophils Relative: 8.5 % — ABNORMAL HIGH (ref 0.0–5.0)
HCT: 36.1 % (ref 36.0–46.0)
Hemoglobin: 11.9 g/dL — ABNORMAL LOW (ref 12.0–15.0)
Lymphocytes Relative: 27.5 % (ref 12.0–46.0)
Lymphs Abs: 0.9 10*3/uL (ref 0.7–4.0)
MCHC: 33 g/dL (ref 30.0–36.0)
MCV: 89.8 fl (ref 78.0–100.0)
Monocytes Absolute: 0.3 10*3/uL (ref 0.1–1.0)
Monocytes Relative: 10.8 % (ref 3.0–12.0)
Neutro Abs: 1.7 10*3/uL (ref 1.4–7.7)
Neutrophils Relative %: 52.1 % (ref 43.0–77.0)
Platelets: 127 10*3/uL — ABNORMAL LOW (ref 150.0–400.0)
RBC: 4.02 Mil/uL (ref 3.87–5.11)
RDW: 14.5 % (ref 11.5–15.5)
WBC: 3.2 10*3/uL — ABNORMAL LOW (ref 4.0–10.5)

## 2021-12-25 LAB — COMPREHENSIVE METABOLIC PANEL
ALT: 32 U/L (ref 0–35)
AST: 34 U/L (ref 0–37)
Albumin: 4.4 g/dL (ref 3.5–5.2)
Alkaline Phosphatase: 94 U/L (ref 39–117)
BUN: 11 mg/dL (ref 6–23)
CO2: 26 mEq/L (ref 19–32)
Calcium: 9.8 mg/dL (ref 8.4–10.5)
Chloride: 105 mEq/L (ref 96–112)
Creatinine, Ser: 0.7 mg/dL (ref 0.40–1.20)
GFR: 85.27 mL/min (ref >60.00)
Glucose, Bld: 200 mg/dL — ABNORMAL HIGH (ref 70–99)
Potassium: 3.8 mEq/L (ref 3.5–5.1)
Sodium: 140 mEq/L (ref 135–145)
Total Bilirubin: 0.7 mg/dL (ref 0.2–1.2)
Total Protein: 7.7 g/dL (ref 6.0–8.3)

## 2021-12-25 LAB — PROTIME-INR
INR: 1.2 ratio — ABNORMAL HIGH (ref 0.8–1.0)
Prothrombin Time: 13.5 s — ABNORMAL HIGH (ref 9.6–13.1)

## 2021-12-25 NOTE — Progress Notes (Signed)
Subjective:    Patient ID: Alyssa Haynes, female    DOB: May 17, 1945, 76 y.o.   MRN: [id number]  HPI  [name] is a pleasant 76 year old African-American female, established with Dr. Loletha Carrow and last seen when she had colonoscopy in November 2021.  She is referred today by [facility] /Dr Combs for management of cirrhosis. That was done for heme positive stool and she was noted to have multiple diverticuli in the left and right colon and 1 diminutive polyp in the transverse colon which was a tubular adenoma  Reviewing patient's chart she has had evidence of cirrhosis on imaging, dating back 4 to 5 years and had ultrasound from 2007 that had shown diffuse fatty infiltration of the liver. Patient says that she has been told that she had fatty liver disease in the past.  She recently had an upper abdominal ultrasound September 2023 because she had been complaining of left upper quadrant pain and was noted to have a cirrhotic appearing liver with coarsened echotexture and nodular contour, portal vein patent, normal appearance and size of spleen, she is status post cholecystectomy.  Most recent labs May 2023 with AST 49/ALT 41/albumin 4.5 Platelets as of January 2023 140. She had negative hepatitis C serologies 2018, and negative hepatitis B serologies 2020. No family history of liver disease that she is aware of, no history of EtOH use.  She says she is currently feeling fine other than having left-sided lateral abdominal pain which she notices only after lying on her side for long time and then trying to turn over.  She says she will get a pain that last for a minute or 2 and then resolves.  This occurs if she lies on the couch on her left side as well.  But does not bother her at all during the daytime with any position movement etc.  She has had the symptoms for over a year.  Bowel movements have been normal, no melena or hematochezia.  She also says she has a lot of issues with gas,  takes Tums and Pepto-Bismol on an as-needed basis for any heartburn or indigestion.  Appetite has been good, weight has been stable.  Medical issues include hypertension, asthma, osteoarthritis, adult onset diabetes mellitus, and anxiety.   Review of Systems Pertinent positive and negative review of systems were noted in the above HPI section.  All other review of systems was otherwise negative.   Outpatient Encounter Medications as of 12/25/2021  Medication Sig   Apple Cid Vn-Grn Tea-Bit Or-Cr (APPLE CIDER VINEGAR PLUS) TABS Take as stated on bottle   Ascorbic Acid (VITAMIN C PO) Take by mouth. 1000 mg po   atorvastatin (LIPITOR) 20 MG tablet Take 1 tablet by mouth once daily   Biotin 1 MG CAPS Take 1 mg by mouth daily.   Blood Glucose Monitoring Suppl (ACCU-CHEK AVIVA PLUS) w/Device KIT Use to check blood sugar 2 times daily before meals. diag code E11.9. noninsulin dependent   Cholecalciferol (VITAMIN D3 PO) Take by mouth.   diazepam (VALIUM) 5 MG tablet TAKE 1/2 TO 1 (ONE-HALF TO ONE) TABLET BY MOUTH EVERY 12 HOURS AS NEEDED FOR ANXIETY   Elderberry 575 MG/5ML SYRP Take 1 drop by mouth daily.   empagliflozin (JARDIANCE) 25 MG TABS tablet Take 1 tablet (25 mg total) by mouth daily before breakfast.   FLOVENT HFA 44 MCG/ACT inhaler INHALE 1 PUFF BY MOUTH TWICE DAILY   glucose blood (ACCU-CHEK AVIVA PLUS) test strip Use to check blood  sugar 2 times daily. diag code E11.9. noninsulin dependent   Magnesium 400 MG CAPS Take by mouth daily.   meloxicam (MOBIC) 7.5 MG tablet Take 7.5 mg by mouth 2 (two) times daily as needed.   metFORMIN (GLUCOPHAGE) 500 MG tablet TAKE 1 TABLET BY MOUTH TWICE DAILY WITH MEALS   Multiple Vitamin (MULTIVITAMIN) LIQD Take 5 mLs by mouth daily.   olmesartan (BENICAR) 20 MG tablet Take 1 tablet by mouth once daily   ferrous sulfate 325 (65 FE) MG tablet Take 1 tablet (325 mg total) by mouth at bedtime.   No facility-administered encounter medications on file as of  12/25/2021.   Allergies  Allergen Reactions   Other Itching and Rash    Wool    Patient Active Problem List   Diagnosis Date Noted   Cognitive decline 07/05/2021   Trigger finger of right thumb 03/01/2021   Osteoarthritis of right hand 03/01/2021   Thrombocytopenia ([place]) 02/28/2021   Diverticular disease of colon, incidental, asymptomatic 07/10/2020   Cirrhosis, non-alcoholic ([place]) 62/83/1517   Leg cramps/leg pain (documentation is under various problems) 09/18/2019   Osteopenia 11/05/2017   Generalized anxiety disorder with panic attacks 08/22/2017   Osteoarthritis of left hip 07/23/2017   Urinary incontinence, urge (nocturnal) 09/25/2016   Benign ovarian cyst 07/10/2016   Chronic venous insufficiency 03/04/2014   Intrinsic asthma 09/02/2008   Type 2 diabetes mellitus without complications ([place]) 61/60/7371   Hyperlipidemia associated with type 2 diabetes mellitus ([place]) 03/12/2006   Essential hypertension 03/12/2006   Social History   Socioeconomic History   Marital status: Married    Spouse name: Not on file   Number of children: 5   Years of education: Not on file   Highest education level: Bachelor's degree (e.g., BA, AB, BS)  Occupational History   Occupation: Retired    Fish farm manager: [employer]  Tobacco Use   Smoking status: Former    Types: Cigarettes    Quit date: 02/19/1995    Years since quitting: 26.8   Smokeless tobacco: Never  Vaping Use   Vaping Use: Never used  Substance and Sexual Activity   Alcohol use: No    Alcohol/week: 0.0 standard drinks of alcohol   Drug use: No   Sexual activity: Not on file  Other Topics Concern   Not on file  Social History Narrative   Current Social History 10/26/2020       Patient lives with spouse in a one level home with 2-3 outside steps with handrails       Patient's method of transportation is personal car.      The highest level of education was Bachelor's Degree      The patient currently retired from the [place]  [employer]      Identified important Relationships are "My husband and all my children." (5 kids, 2 stepkids)      Pets : 1 dog, Scientist, research (physical sciences) / Fun: "Going to [employer], watching old movies, working in the [employer] garden, feed my birds"      Current Stressors: "My husband."       Religious / Personal Beliefs: "Psychologist, forensic"       Social Determinants of Health   Financial Resource Strain: Not on file  Food Insecurity: Not on file  Transportation Needs: Not on file  Physical Activity: Not on file  Stress: Not on file  Social Connections: Not on file  Intimate Partner Violence: Not on file  Ms. Costello family history includes Alcoholism in her brother; Asthma in her daughter and son; Cancer (age of onset: 71) in her mother; Diabetes in her daughter and daughter; Gout in her father; Heart attack in her father; Heart disease (age of onset: 58) in her father; Osteoporosis in her mother.      Objective:    Vitals:   12/25/21 0953  BP: (!) 150/78  Pulse: 75  SpO2: 97%    Physical Exam Well-developed well-nourished older African-American female in no acute distress.  Accompanied by her daughter  height, Weight, 175 BMI 28.3  HEENT; nontraumatic normocephalic, EOMI, PE R LA, sclera anicteric. Oropharynx; not examined today Neck; supple, no JVD Cardiovascular; regular rate and rhythm with S1-S2, no murmur rub or gallop Pulmonary; Clear bilaterally-no costal margin tenderness or tenderness over the left lower ribs Abdomen; soft, nontender, nondistended, no appreciable ascites or fluid wave no palpable mass or hepatosplenomegaly, bowel sounds are active Rectal; not done today Skin; benign exam, no jaundice rash or appreciable lesions Extremities; no clubbing cyanosis or edema skin warm and dry Neuro/Psych; alert and oriented x4, grossly nonfocal mood and affect appropriate        Assessment & Plan:   #69 76 year old African-American female referred for management of  cirrhosis noted on recent ultrasound imaging. Ultrasound and elastography as of October 2021 had also shown cirrhosis Patient relates that she had been told several years ago that she had fatty liver disease.  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  To date cirrhosis has been compensated  #2 history of adenomatous colon polyp-up-to-date with colonoscopy last done November 2021-no further surveillance recommended due to age  #3 diverticulosis  #4 left upper quadrant/lateral pain present over the past year and only noted with prolonged lying on the left side.  Patient is not bothered by this pain at any other time and pain is relieved when she moves and repositions. Etiology is not clear, I suspect this is musculoskeletal no I cannot reproduce on exam-rule out other chronic intra-abdominal inflammatory process, lesion.  #5 adult onset diabetes mellitus #6.  Hypertension #7.  Asthma #8.  Osteoarthritis #9.  Anxiety  Plan; CBC, c-Met, pro time/INR, AFP ANA, anti-smooth muscle antibody, antimitochondrial antibody Will schedule for CT of the abdomen and pelvis with contrast Patient will also be scheduled for upper endoscopy with Dr. Loletha Carrow for variceal surveillance in setting of cirrhosis.  Procedure was discussed in detail with the patient and her daughter including indications risk and benefits and they are agreeable to proceed.  Will need repeat ultrasound in 6 months   Mozell Hardacre S Alexyss Balzarini PA-C 12/25/2021   Cc: Angelica Pou, MD

## 2021-12-25 NOTE — Patient Instructions (Signed)
_______________________________________________________  If you are age 74 or older, your body mass index should be between 23-30. Your Body mass index is 28.34 kg/m. If this is out of the aforementioned range listed, please consider follow up with your Primary Care Provider.  If you are age 3 or younger, your body mass index should be between 19-25. Your Body mass index is 28.34 kg/m. If this is out of the aformentioned range listed, please consider follow up with your Primary Care Provider.   Your provider has requested that you go to the basement level for lab work before leaving today. Press "B" on the elevator. The lab is located at the first door on the left as you exit the elevator.  You have been scheduled for an endoscopy. Please follow written instructions given to you at your visit today. If you use inhalers (even only as needed), please bring them with you on the day of your procedure.  You have been scheduled for a CT scan of the abdomen and pelvis at [facility], 1st floor Radiology. You are scheduled on 01/08/22 at 7:30am. You should arrive 15 minutes prior to your appointment time for registration.   Please follow the written instructions below on the day of your exam:   1) Do not eat anything after 3:30am (4 hours prior to your test)   You may take any medications as prescribed with a small amount of water, if necessary. If you take any of the following medications: METFORMIN, GLUCOPHAGE, GLUCOVANCE, AVANDAMET, RIOMET, FORTAMET, [place] MET, JANUMET, GLUMETZA or METAGLIP, you MAY be asked to HOLD this medication 48 hours AFTER the exam.   The purpose of you drinking the oral contrast is to aid in the visualization of your intestinal tract. The contrast solution may cause some diarrhea. Depending on your individual set of symptoms, you may also receive an intravenous injection of x-ray contrast/dye. Plan on being at [facility] for 45 minutes or longer, depending on the  type of exam you are having performed.   If you have any questions regarding your exam or if you need to reschedule, you may call [facility] at [phone] between the hours of 8:00 am and 5:00 pm, Monday-Friday.     Due to recent changes in healthcare laws, you may see the results of your imaging and laboratory studies on MyChart before your provider has had a chance to review them.  We understand that in some cases there may be results that are confusing or concerning to you. Not all laboratory results come back in the same time frame and the provider may be waiting for multiple results in order to interpret others.  Please give [place] 48 hours in order for your provider to thoroughly review all the results before contacting the office for clarification of your results.   The [place] [facility] providers would like to encourage you to use [facility] to communicate with providers for non-urgent requests or questions.  Due to long hold times on the telephone, sending your provider a message by [facility] may be a faster and more efficient way to get a response.  Please allow 48 business hours for a response.  Please remember that this is for non-urgent requests.   It was a pleasure to see you today!  Thank you for trusting me with your gastrointestinal care!

## 2021-12-26 NOTE — Progress Notes (Signed)
____________________________________________________________  Attending physician addendum:  Thank you for sending this case to me. I have reviewed the entire note and agree with the plan.  Agree most likely metabolic fatty liver disease -  lab/imaging/endoscopic workup as outlined.  Wilfrid Lund, MD  ____________________________________________________________

## 2021-12-31 LAB — MITOCHONDRIAL ANTIBODIES: Mitochondrial M2 Ab, IgG: <=20 U (ref <=20.0)

## 2021-12-31 LAB — ANA: Anti Nuclear Antibody (ANA): NEGATIVE

## 2021-12-31 LAB — ANTI-SMOOTH MUSCLE ANTIBODY, IGG: Actin (Smooth Muscle) Antibody (IGG): <20 U (ref <20)

## 2021-12-31 LAB — AFP TUMOR MARKER: AFP-Tumor Marker: 1.4 ng/mL

## 2022-01-02 ENCOUNTER — Encounter: Payer: Self-pay | Admitting: Podiatry

## 2022-01-02 ENCOUNTER — Ambulatory Visit: Payer: Medicare Other | Admitting: Podiatry

## 2022-01-02 DIAGNOSIS — B351 Tinea unguium: Secondary | ICD-10-CM

## 2022-01-02 DIAGNOSIS — M2012 Hallux valgus (acquired), left foot: Secondary | ICD-10-CM

## 2022-01-02 DIAGNOSIS — M79675 Pain in left toe(s): Secondary | ICD-10-CM | POA: Diagnosis not present

## 2022-01-02 DIAGNOSIS — M79674 Pain in right toe(s): Secondary | ICD-10-CM | POA: Diagnosis not present

## 2022-01-02 DIAGNOSIS — M2041 Other hammer toe(s) (acquired), right foot: Secondary | ICD-10-CM

## 2022-01-02 DIAGNOSIS — M2042 Other hammer toe(s) (acquired), left foot: Secondary | ICD-10-CM | POA: Diagnosis not present

## 2022-01-02 DIAGNOSIS — E119 Type 2 diabetes mellitus without complications: Secondary | ICD-10-CM | POA: Diagnosis not present

## 2022-01-02 DIAGNOSIS — M2011 Hallux valgus (acquired), right foot: Secondary | ICD-10-CM | POA: Diagnosis not present

## 2022-01-02 NOTE — Progress Notes (Signed)
ANNUAL DIABETIC FOOT EXAM  Subjective: Alyssa Haynes presents today for annual diabetic foot examination.  Chief Complaint  Patient presents with   Nail Problem    Diabetic foot care BS- did not check today A1C-7.0 PCP- Margretta [facility] PCP VST-2 months ago    Patient confirms h/o diabetes.  Patient relates 7 year h/o diabetes.  Patient denies any h/o foot wounds.  Patient denies any numbness, tingling, burning, or pins/needle sensation in feet.  Risk factors: diabetes, HTN, hyperlipidemia, h/o tobacco use in remission.  Angelica Pou, MD is patient's PCP.  Past Medical History:  Diagnosis Date   Anxiety attack 08/22/2017   Arthritis    Asthma    Benign ovarian cyst 07/10/2016   Bronchitis 06/22/2009   Qualifier: Diagnosis of  By: Eyvonne Mechanic MD, Vijay     Cataract    Cirrhosis ([place])    Diabetes mellitus    GERD (gastroesophageal reflux disease)    Hyperlipidemia    Hypertension    Osteoporosis    Thrombocytopenia ([place]) 02/28/2021   Associated with cirrhosis   Patient Active Problem List   Diagnosis Date Noted   Cognitive decline 07/05/2021   Trigger finger of right thumb 03/01/2021   Osteoarthritis of right hand 03/01/2021   Thrombocytopenia ([place]) 02/28/2021   Diverticular disease of colon, incidental, asymptomatic 07/10/2020   Cirrhosis, non-alcoholic ([place]) 63/14/9702   Leg cramps/leg pain (documentation is under various problems) 09/18/2019   Osteopenia 11/05/2017   Generalized anxiety disorder with panic attacks 08/22/2017   Osteoarthritis of left hip 07/23/2017   Urinary incontinence, urge (nocturnal) 09/25/2016   Benign ovarian cyst 07/10/2016   Chronic venous insufficiency 03/04/2014   Intrinsic asthma 09/02/2008   Type 2 diabetes mellitus without complications ([place]) 63/78/5885   Hyperlipidemia associated with type 2 diabetes mellitus ([place]) 03/12/2006   Essential hypertension 03/12/2006   Past Surgical History:  Procedure Laterality Date    CESAREAN SECTION  1988   CHOLECYSTECTOMY  2006   COLONOSCOPY     8-9- years ago- unsure of MD   FRACTURE SURGERY  1992   lEFT ANKLE orif   plantar fasciitis Right    release   TUBAL LIGATION  1988   Current Outpatient Medications on File Prior to Visit  Medication Sig Dispense Refill   Apple Cid Vn-Grn Tea-Bit Or-Cr (APPLE CIDER VINEGAR PLUS) TABS Take as stated on bottle 60 tablet 0   Ascorbic Acid (VITAMIN C PO) Take by mouth. 1000 mg po     atorvastatin (LIPITOR) 20 MG tablet Take 1 tablet by mouth once daily 90 tablet 3   Biotin 1 MG CAPS Take 1 mg by mouth daily. 30 capsule 0   Blood Glucose Monitoring Suppl (ACCU-CHEK AVIVA PLUS) w/Device KIT Use to check blood sugar 2 times daily before meals. diag code E11.9. noninsulin dependent 1 kit 0   Cholecalciferol (VITAMIN D3 PO) Take by mouth.     diazepam (VALIUM) 5 MG tablet TAKE 1/2 TO 1 (ONE-HALF TO ONE) TABLET BY MOUTH EVERY 12 HOURS AS NEEDED FOR ANXIETY 5 tablet 0   Elderberry 575 MG/5ML SYRP Take 1 drop by mouth daily.     empagliflozin (JARDIANCE) 25 MG TABS tablet Take 1 tablet (25 mg total) by mouth daily before breakfast. 21 tablet 0   ferrous sulfate 325 (65 FE) MG tablet Take 1 tablet (325 mg total) by mouth at bedtime. 90 tablet 3   FLOVENT HFA 44 MCG/ACT inhaler INHALE 1 PUFF BY MOUTH TWICE DAILY 11 g 0  glucose blood (ACCU-CHEK AVIVA PLUS) test strip Use to check blood sugar 2 times daily. diag code E11.9. noninsulin dependent 200 each 1   Magnesium 400 MG CAPS Take by mouth daily.     meloxicam (MOBIC) 7.5 MG tablet Take 7.5 mg by mouth 2 (two) times daily as needed.     metFORMIN (GLUCOPHAGE) 500 MG tablet TAKE 1 TABLET BY MOUTH TWICE DAILY WITH MEALS 180 tablet 3   Multiple Vitamin (MULTIVITAMIN) LIQD Take 5 mLs by mouth daily.     olmesartan (BENICAR) 20 MG tablet Take 1 tablet by mouth once daily 90 tablet 3   No current facility-administered medications on file prior to visit.    Allergies  Allergen Reactions    Other Itching and Rash    Wool    Social History   Occupational History   Occupation: Retired    Fish farm manager: [employer]  Tobacco Use   Smoking status: Former    Types: Cigarettes    Quit date: 02/19/1995    Years since quitting: 26.9   Smokeless tobacco: Never  Vaping Use   Vaping Use: Never used  Substance and Sexual Activity   Alcohol use: No    Alcohol/week: 0.0 standard drinks of alcohol   Drug use: No   Sexual activity: Not on file   Family History  Problem Relation Age of Onset   Cancer Mother 59       lUNG CANCER   Osteoporosis Mother    Heart disease Father 67   Heart attack Father    Gout Father    Alcoholism Brother    Diabetes Daughter    Diabetes Daughter    Asthma Daughter    Asthma Son    Breast cancer Neg Hx    Colon cancer Neg Hx    Esophageal cancer Neg Hx    Rectal cancer Neg Hx    Stomach cancer Neg Hx    Immunization History  Administered Date(s) Administered   Influenza Split 12/25/2010, 12/09/2011   Influenza,inj,Quad PF,6+ Mos 11/29/2016, 11/05/2017, 11/26/2018, 12/10/2019   PFIZER(Purple Top)SARS-COV-2 Vaccination 03/23/2019, 04/19/2019   Pneumococcal Polysaccharide-23 07/02/2011   Td 09/02/2008     Review of Systems: Negative except as noted in the HPI.   Objective: There were no vitals filed for this visit.  Alyssa Haynes is a pleasant 76 y.o. female in NAD. AAO X 3.  Vascular Examination: Capillary refill time immediate b/l.Vascular status intact b/l with palpable pedal pulses. Pedal hair present b/l. No edema. No pain with calf compression b/l. Skin temperature gradient WNL b/l.   Neurological Examination: Sensation grossly intact b/l with 10 gram monofilament. Vibratory sensation intact b/l.   Dermatological Examination: Pedal skin with normal turgor, texture and tone b/l. Toenails 1-5 b/l thick, discolored, elongated with subungual debris and pain on dorsal palpation. No hyperkeratotic nor porokeratotic lesions  present on today's visit.  Musculoskeletal Examination: Normal muscle strength 5/5 to all lower extremity muscle groups bilaterally. HAV with bunion bilaterally and hammertoes 2-5 b/l.Marland Kitchen No pain, crepitus or joint limitation noted with ROM b/l LE.  Patient ambulates independently without assistive aids.  Radiographs: None  Last A1c:      Latest Ref Rng & Units 07/05/2021    8:58 AM 03/01/2021    9:51 AM  Hemoglobin A1C  Hemoglobin-A1c 4.0 - 5.6 % 10.2  8.8    Footwear Assessment: Does the patient wear appropriate shoes? Yes. Does the patient need inserts/orthotics? No.  ADA Risk Categorization: Low Risk :  Patient has all of the following: Intact protective sensation No prior foot ulcer  No severe deformity Pedal pulses present  Assessment: 1. Pain due to onychomycosis of toenails of both feet   2. Hammertoe, bilateral   3. Hallux valgus, acquired, bilateral   4. Type 2 diabetes mellitus without complication, without long-term current use of insulin ([place])   5. Encounter for diabetic foot exam [facility])     Plan: -Patient was evaluated and treated. All patient's and/or POA's questions/concerns answered on today's visit. -Diabetic foot examination performed today. -Continue diabetic foot care principles: inspect feet daily, monitor glucose as recommended by PCP and/or Endocrinologist, and follow prescribed diet per PCP, Endocrinologist and/or dietician. -Continue supportive shoe gear daily. -Toenails 1-5 b/l were debrided in length and girth with sterile nail nippers and dremel without iatrogenic bleeding.  -Patient/POA to call should there be question/concern in the interim. Return in about 3 months (around 04/04/2022).  Marzetta Board, DPM

## 2022-01-08 ENCOUNTER — Encounter (HOSPITAL_COMMUNITY): Payer: Self-pay

## 2022-01-08 ENCOUNTER — Ambulatory Visit (HOSPITAL_COMMUNITY)
Admission: RE | Admit: 2022-01-08 | Discharge: 2022-01-08 | Disposition: A | Discharge status: Home / Self Care | Payer: Medicare Other | Source: Ambulatory Visit | Attending: Physician Assistant | Admitting: Physician Assistant

## 2022-01-08 DIAGNOSIS — K7460 Unspecified cirrhosis of liver: Secondary | ICD-10-CM | POA: Diagnosis present

## 2022-01-08 DIAGNOSIS — R1012 Left upper quadrant pain: Secondary | ICD-10-CM | POA: Insufficient documentation

## 2022-01-08 MED ORDER — IOHEXOL 300 MG/ML  SOLN
100.0000 mL | Freq: Once | INTRAMUSCULAR | Status: AC | PRN
  Administered 2022-01-08: 100 mL via INTRAVENOUS

## 2022-01-08 MED ORDER — SODIUM CHLORIDE (PF) 0.9 % IJ SOLN
INTRAMUSCULAR | Status: AC
  Filled 2022-01-08: qty 50

## 2022-01-15 ENCOUNTER — Other Ambulatory Visit: Payer: Self-pay

## 2022-01-15 DIAGNOSIS — R1012 Left upper quadrant pain: Secondary | ICD-10-CM

## 2022-01-15 DIAGNOSIS — R110 Nausea: Secondary | ICD-10-CM

## 2022-01-15 DIAGNOSIS — K838 Other specified diseases of biliary tract: Secondary | ICD-10-CM

## 2022-01-15 DIAGNOSIS — K7460 Unspecified cirrhosis of liver: Secondary | ICD-10-CM

## 2022-01-15 NOTE — Addendum Note (Signed)
Addended by: Virgina Evener A on: 01/15/2022 10:35 AM   Modules accepted: Orders

## 2022-01-16 ENCOUNTER — Ambulatory Visit (INDEPENDENT_AMBULATORY_CARE_PROVIDER_SITE_OTHER): Payer: Medicare Other

## 2022-01-16 DIAGNOSIS — E119 Type 2 diabetes mellitus without complications: Secondary | ICD-10-CM

## 2022-01-16 DIAGNOSIS — E113299 Type 2 diabetes mellitus with mild nonproliferative diabetic retinopathy without macular edema, unspecified eye: Secondary | ICD-10-CM

## 2022-01-16 DIAGNOSIS — Z794 Long term (current) use of insulin: Secondary | ICD-10-CM

## 2022-01-16 NOTE — Progress Notes (Signed)
Patient presents to the office today for diabetic shoe and insole measuring.  Patient was measured with brannock device to determine size and width for 1 pair of extra depth shoes and foam casted for 3 pair of insoles.   Documentation of medical necessity will be sent to patient's treating diabetic doctor to verify and sign.   Patient's diabetic provider: [facility]. Windle [facility] [address]   Shoes and insoles will be ordered at that time and patient will be notified for an appointment for fitting when they arrive.   Shoe size (per patient): 10 medium women's    Brannock measurement: 9.5 medium women's   Patient shoe selection-   Shoe choice:   [id number] Apex Performance Sneaker                         [id number] Apex Performance Sneaker  Shoe size ordered: 10 medium women's

## 2022-01-17 ENCOUNTER — Encounter: Payer: Medicare Other | Admitting: Gastroenterology

## 2022-01-19 ENCOUNTER — Encounter: Payer: Self-pay | Admitting: Certified Registered Nurse Anesthetist

## 2022-01-23 ENCOUNTER — Ambulatory Visit (AMBULATORY_SURGERY_CENTER): Payer: Medicare Other | Admitting: Gastroenterology

## 2022-01-23 ENCOUNTER — Encounter: Payer: Self-pay | Admitting: Gastroenterology

## 2022-01-23 VITALS — BP 103/70 | HR 84 | Temp 96.6°F | Resp 15 | Ht 66.0 in | Wt 175.0 lb

## 2022-01-23 DIAGNOSIS — K2950 Unspecified chronic gastritis without bleeding: Secondary | ICD-10-CM | POA: Diagnosis not present

## 2022-01-23 DIAGNOSIS — K2980 Duodenitis without bleeding: Secondary | ICD-10-CM | POA: Diagnosis not present

## 2022-01-23 DIAGNOSIS — K7460 Unspecified cirrhosis of liver: Secondary | ICD-10-CM | POA: Diagnosis not present

## 2022-01-23 DIAGNOSIS — I8510 Secondary esophageal varices without bleeding: Secondary | ICD-10-CM | POA: Diagnosis not present

## 2022-01-23 DIAGNOSIS — K257 Chronic gastric ulcer without hemorrhage or perforation: Secondary | ICD-10-CM

## 2022-01-23 DIAGNOSIS — R1012 Left upper quadrant pain: Secondary | ICD-10-CM

## 2022-01-23 MED ORDER — SODIUM CHLORIDE 0.9 % IV SOLN: 500.0000 mL | INTRAVENOUS | Status: DC

## 2022-01-23 MED ORDER — PANTOPRAZOLE SODIUM 40 MG PO TBEC: 40.0000 mg | DELAYED_RELEASE_TABLET | Freq: Two times a day (BID) | ORAL | 1 refills | Status: DC

## 2022-01-23 NOTE — Progress Notes (Signed)
Called to room to assist during endoscopic procedure.  Patient ID and intended procedure confirmed with present staff. Received instructions for my participation in the procedure from the performing physician.  

## 2022-01-23 NOTE — Progress Notes (Signed)
No changes to clinical history since GI office visit on 12/25/21.  Platelets 127K, INR 1.2, AFP nml, CTAP report noted  Variceal screening EGD today  The patient is appropriate for an endoscopic procedure in the ambulatory setting.  - Wilfrid Lund, MD

## 2022-01-23 NOTE — Progress Notes (Signed)
Report given to PACU, vss 

## 2022-01-23 NOTE — Patient Instructions (Signed)
Start Pantoprazole 40 mg twice daily  Call primary care to make appointment to discuss starting a beta blocker Return to Dr. Loletha Carrow office at appointment to be scheduled  NO ASPIRIN, ASPIRIN CONTAINING PRODUCTS (BC OR GOODY POWDERS) OR NSAIDS (IBUPROFEN, ADVIL, ALEVE, AND MOTRIN); TYLENOL IS OK TO TAKE  YOU HAD AN ENDOSCOPIC PROCEDURE TODAY AT [facility]:   Refer to the procedure report that was given to you for any specific questions about what was found during the examination.  If the procedure report does not answer your questions, please call your gastroenterologist to clarify.  If you requested that your care partner not be given the details of your procedure findings, then the procedure report has been included in a sealed envelope for you to review at your convenience later.  YOU SHOULD EXPECT: Some feelings of bloating in the abdomen. Passage of more gas than usual.  Walking can help get rid of the air that was put into your GI tract during the procedure and reduce the bloating. If you had a lower endoscopy (such as a colonoscopy or flexible sigmoidoscopy) you may notice spotting of blood in your stool or on the toilet paper. If you underwent a bowel prep for your procedure, you may not have a normal bowel movement for a few days.  Please Note:  You might notice some irritation and congestion in your nose or some drainage.  This is from the oxygen used during your procedure.  There is no need for concern and it should clear up in a day or so.  SYMPTOMS TO REPORT IMMEDIATELY:  Following upper endoscopy (EGD)  Vomiting of blood or coffee ground material  New chest pain or pain under the shoulder blades  Painful or persistently difficult swallowing  New shortness of breath  Fever of 100F or higher  Black, tarry-looking stools  For urgent or emergent issues, a gastroenterologist can be reached at any hour by calling [phone]. Do not use MyChart messaging for urgent  concerns.    DIET:  We do recommend a small meal at first, but then you may proceed to your regular diet.  Drink plenty of fluids but you should avoid alcoholic beverages for 24 hours.  ACTIVITY:  You should plan to take it easy for the rest of today and you should NOT DRIVE or use heavy machinery until tomorrow (because of the sedation medicines used during the test).    FOLLOW UP: Our staff will call the number listed on your records the next business day following your procedure.  We will call around 7:15- 8:00 am to check on you and address any questions or concerns that you may have regarding the information given to you following your procedure. If we do not reach you, we will leave a message.     If any biopsies were taken you will be contacted by phone or by letter within the next 1-3 weeks.  Please call us at [phone] if you have not heard about the biopsies in 3 weeks.    SIGNATURES/CONFIDENTIALITY: You and/or your care partner have signed paperwork which will be entered into your electronic medical record.  These signatures attest to the fact that that the information above on your After Visit Summary has been reviewed and is understood.  Full responsibility of the confidentiality of this discharge information lies with you and/or your care-partner.

## 2022-01-23 NOTE — Progress Notes (Signed)
Pt states no changes to medical hx since previsit.  There was a change to medication and it was adjusted in the record.

## 2022-01-23 NOTE — Op Note (Signed)
[facility] Patient Name: Alyssa Haynes Procedure Date: 01/23/2022 9:41 AM MRN: [id number] Endoscopist: Mallie Mussel L. Loletha Carrow , MD, [id number] Age: 76 Referring MD:  Date of Birth: Jun 02, 1945 Gender: Female Account #: [id number] Procedure:                Upper GI endoscopy Indications:              Cirrhosis rule out esophageal varices Medicines:                Monitored Anesthesia Care Procedure:                Pre-Anesthesia Assessment:                           - Prior to the procedure, a History and Physical                            was performed, and patient medications and                            allergies were reviewed. The patient's tolerance of                            previous anesthesia was also reviewed. The risks                            and benefits of the procedure and the sedation                            options and risks were discussed with the patient.                            All questions were answered, and informed consent                            was obtained. Prior Anticoagulants: The patient has                            taken no anticoagulant or antiplatelet agents. ASA                            Grade Assessment: III - A patient with severe                            systemic disease. After reviewing the risks and                            benefits, the patient was deemed in satisfactory                            condition to undergo the procedure.                           After obtaining informed consent, the endoscope was  passed under direct vision. Throughout the                            procedure, the patient's blood pressure, pulse, and                            oxygen saturations were monitored continuously. The                            GIF [id number] #[id number] was introduced through the                            mouth, and advanced to the second part of duodenum.                            The upper  GI endoscopy was accomplished without                            difficulty. The patient tolerated the procedure                            well. Scope In: Scope Out: Findings:                 The larynx was normal.                           Grade III varices were found in the middle third of                            the esophagus and in the lower third of the                            esophagus.                           One non-bleeding superficial gastric ulcer with no                            stigmata of bleeding was found in the gastric                            antrum. The lesion was 6 mm in largest dimension.                            Biopsies were taken with a cold forceps for                            histology.(ulcer Bx , and additional jar Bx                            antrum/body to r/o H pylori)                           The exam of the stomach  was otherwise normal,                            including on retroflexion. No gastric varices seen.                           Diffuse mucosal changes characterized by congestion                            and granularity were found in the duodenal bulb.                            Biopsies were taken with a cold forceps for                            histology.                           The exam of the duodenum was otherwise normal. Complications:            No immediate complications. Estimated Blood Loss:     Estimated blood loss was minimal. Impression:               - Normal larynx.                           - Grade III esophageal varices.                           - Non-bleeding gastric ulcer with no stigmata of                            bleeding. Biopsied.                           - Mucosal changes in the duodenum. Biopsied. Recommendation:           - Patient has a contact number available for                            emergencies. The signs and symptoms of potential                            delayed complications  were discussed with the                            patient. Return to normal activities tomorrow.                            Written discharge instructions were provided to the                            patient.                           - Resume previous diet.                           -  Continue present medications.                           - Await pathology results.                           - Return to my office at appointment to be                            scheduled.                           - Return to primary care physician within next few                            weeks. Bring this report (which will also be sent                            to that clinic).                           Patient needs to start a nonselective beta blocker                            (nadolol, propranolol, carvedilol) for variceal                            bleeding prophylaxis.(goal resting HR 55-60)                            Consider changing the current ARB to beta blocker.                            PCP to manage the anti-hypertensive.                           - Stop aspirin and NDAIDs if currently taking them.                           - Pantoprazole 40 mg tablet twice daily. Disp #60,                            RF 1 Alson Mcpheeters L. Loletha Carrow, MD 01/23/2022 10:22:49 AM This report has been signed electronically.

## 2022-01-23 NOTE — Progress Notes (Signed)
Robinul 0.1 mg IV given due large amount of secretions upon assessment.  MD made aware, vss 

## 2022-01-24 ENCOUNTER — Telehealth: Payer: Self-pay

## 2022-01-24 ENCOUNTER — Telehealth: Payer: Self-pay | Admitting: *Deleted

## 2022-01-24 NOTE — Telephone Encounter (Signed)
Per 01/23/22 procedure report - Return to office at appt to be scheduled.  Patient has been scheduled for an appointment with Dr. Loletha Carrow on Tuesday, 03/05/22 at 9:20 am. Appt information has been mailed and sent via [facility].

## 2022-01-24 NOTE — Telephone Encounter (Signed)
Attempted f/u phone call. No answer. Left message. °

## 2022-01-25 ENCOUNTER — Encounter: Payer: Self-pay | Admitting: Gastroenterology

## 2022-01-28 ENCOUNTER — Other Ambulatory Visit: Payer: Self-pay | Admitting: Physician Assistant

## 2022-01-28 ENCOUNTER — Ambulatory Visit (HOSPITAL_COMMUNITY)
Admission: RE | Admit: 2022-01-28 | Discharge: 2022-01-28 | Disposition: A | Discharge status: Home / Self Care | Payer: Medicare Other | Source: Ambulatory Visit | Attending: Physician Assistant | Admitting: Physician Assistant

## 2022-01-28 DIAGNOSIS — K838 Other specified diseases of biliary tract: Secondary | ICD-10-CM | POA: Insufficient documentation

## 2022-01-28 DIAGNOSIS — R1012 Left upper quadrant pain: Secondary | ICD-10-CM

## 2022-01-28 DIAGNOSIS — K7460 Unspecified cirrhosis of liver: Secondary | ICD-10-CM | POA: Diagnosis present

## 2022-01-28 MED ORDER — GADOBUTROL 1 MMOL/ML IV SOLN
8.0000 mL | Freq: Once | INTRAVENOUS | Status: AC | PRN
  Administered 2022-01-28: 8 mL via INTRAVENOUS

## 2022-01-31 ENCOUNTER — Encounter: Payer: Self-pay | Admitting: Gastroenterology

## 2022-02-07 ENCOUNTER — Encounter: Payer: Self-pay | Admitting: Gastroenterology

## 2022-02-08 NOTE — Telephone Encounter (Signed)
Not typical side effects, but you never know.  Decrease the pantoprazole to once daily.  If she needs some medicine to relieve the nausea, sent a prescription for Zofran 4 mg tablet, 1 tablet every 8 hours as needed, dispense 30, refill 1  - HD

## 2022-02-14 ENCOUNTER — Other Ambulatory Visit: Payer: Self-pay

## 2022-02-14 MED ORDER — METFORMIN HCL 1000 MG PO TABS: 1000.0000 mg | ORAL_TABLET | Freq: Two times a day (BID) | ORAL | 2 refills | Status: AC

## 2022-02-14 NOTE — Telephone Encounter (Signed)
Metformin 

## 2022-02-27 ENCOUNTER — Other Ambulatory Visit: Payer: Medicare Other

## 2022-03-01 ENCOUNTER — Encounter: Payer: Self-pay | Admitting: Gastroenterology

## 2022-03-05 ENCOUNTER — Encounter: Payer: Self-pay | Admitting: Gastroenterology

## 2022-03-05 ENCOUNTER — Other Ambulatory Visit: Payer: Medicare HMO

## 2022-03-05 ENCOUNTER — Ambulatory Visit: Payer: Medicare HMO | Admitting: Gastroenterology

## 2022-03-05 VITALS — BP 130/70 | HR 60 | Ht 66.5 in | Wt 177.0 lb

## 2022-03-05 DIAGNOSIS — I8510 Secondary esophageal varices without bleeding: Secondary | ICD-10-CM

## 2022-03-05 DIAGNOSIS — K257 Chronic gastric ulcer without hemorrhage or perforation: Secondary | ICD-10-CM

## 2022-03-05 DIAGNOSIS — K7460 Unspecified cirrhosis of liver: Secondary | ICD-10-CM

## 2022-03-05 DIAGNOSIS — K7469 Other cirrhosis of liver: Secondary | ICD-10-CM

## 2022-03-05 DIAGNOSIS — K7581 Nonalcoholic steatohepatitis (NASH): Secondary | ICD-10-CM

## 2022-03-05 NOTE — Patient Instructions (Signed)
_______________________________________________________  If your blood pressure at your visit was 140/90 or greater, please contact your primary care physician to follow up on this.  _______________________________________________________  If you are age 75 or older, your body mass index should be between 23-30. Your Body mass index is 28.14 kg/m. If this is out of the aforementioned range listed, please consider follow up with your Primary Care Provider.  If you are age 28 or younger, your body mass index should be between 19-25. Your Body mass index is 28.14 kg/m. If this is out of the aformentioned range listed, please consider follow up with your Primary Care Provider.   ________________________________________________________  The [facility] providers would like to encourage you to use [facility] to communicate with providers for non-urgent requests or questions.  Due to long hold times on the telephone, sending your provider a message by [facility] may be a faster and more efficient way to get a response.  Please allow 48 business hours for a response.  Please remember that this is for non-urgent requests.  _______________________________________________________  Your provider has requested that you go to the basement level for lab work before leaving today. Press "B" on the elevator. The lab is located at the first door on the left as you exit the elevator.  Please follow up with Dr. Loletha Carrow in 4 months

## 2022-03-05 NOTE — Progress Notes (Signed)
[facility] Progress Note  Chief Complaint: Karlene Lineman related cirrhosis  Subjective  History: Alyssa Haynes follows up for her fatty liver related cirrhosis, first evaluated in our office November 2023 and then upper endoscopy December 2023 revealing grade 3 esophageal varices.  Care coordinated with her PCP to begin nonselective beta-blocker.  See subsequent chart notes that her home losartan dose was decreased and she was started on nadolol 20 mg daily with instructions to follow-up blood pressure regularly with primary care. She also sent messages regarding nausea and rash occurring around the same time she started pantoprazole, which seemed unlikely side effect from that meds and she was instructed to be evaluated by primary care.  The PPI restarted because of a small NSAID related ulcer on the EGD (H. pylori negative).  Kalyani has generally been well since I last saw her.  She realized the rash was related to an allergic trigger in their house.  She has been tolerating the new medicines well and denies nausea or vomiting or black tarry stool.  ROS: Cardiovascular:  no chest pain Respiratory: no dyspnea Arthralgias Remainder systems negative except as above The patient's Past Medical, Family and Social History were reviewed and are on file in the EMR. Past Medical History:  Diagnosis Date   Anxiety attack 08/22/2017   Arthritis    Asthma    Benign ovarian cyst 07/10/2016   Bronchitis 06/22/2009   Qualifier: Diagnosis of  By: Eyvonne Mechanic MD, Vijay     Cataract    Cirrhosis ([place])    Diabetes mellitus    GERD (gastroesophageal reflux disease)    Hyperlipidemia    Hypertension    Osteoporosis    Thrombocytopenia ([place]) 02/28/2021   Associated with cirrhosis    Objective:  Med list reviewed  Current Outpatient Medications:    Apple Cid Vn-Grn Tea-Bit Or-Cr (APPLE CIDER VINEGAR PLUS) TABS, Take as stated on bottle, Disp: 60 tablet, Rfl: 0   Ascorbic Acid (VITAMIN C PO), Take by mouth.  1000 mg po, Disp: , Rfl:    atorvastatin (LIPITOR) 20 MG tablet, Take 1 tablet by mouth once daily, Disp: 90 tablet, Rfl: 3   Biotin 1 MG CAPS, Take 1 mg by mouth daily., Disp: 30 capsule, Rfl: 0   Blood Glucose Monitoring Suppl (ACCU-CHEK AVIVA PLUS) w/Device KIT, Use to check blood sugar 2 times daily before meals. diag code E11.9. noninsulin dependent, Disp: 1 kit, Rfl: 0   Cholecalciferol (VITAMIN D3 PO), Take by mouth., Disp: , Rfl:    Elderberry 575 MG/5ML SYRP, Take 1 drop by mouth daily., Disp: , Rfl:    empagliflozin (JARDIANCE) 25 MG TABS tablet, Take 1 tablet (25 mg total) by mouth daily before breakfast., Disp: 21 tablet, Rfl: 0   FLOVENT HFA 44 MCG/ACT inhaler, INHALE 1 PUFF BY MOUTH TWICE DAILY, Disp: 11 g, Rfl: 0   glucose blood (ACCU-CHEK AVIVA PLUS) test strip, Use to check blood sugar 2 times daily. diag code E11.9. noninsulin dependent, Disp: 200 each, Rfl: 1   Magnesium 400 MG CAPS, Take by mouth daily., Disp: , Rfl:    metFORMIN (GLUCOPHAGE) 1000 MG tablet, Take 1 tablet (1,000 mg total) by mouth 2 (two) times daily with a meal., Disp: 120 tablet, Rfl: 2   Multiple Vitamin (MULTIVITAMIN) LIQD, Take 5 mLs by mouth daily., Disp: , Rfl:    nadolol (CORGARD) 20 MG tablet, Take 20 mg by mouth daily., Disp: , Rfl:    olmesartan (BENICAR) 5 MG tablet, Take 5 mg  by mouth daily., Disp: , Rfl:    pantoprazole (PROTONIX) 40 MG tablet, Take 1 tablet (40 mg total) by mouth 2 (two) times daily., Disp: 60 tablet, Rfl: 1   ferrous sulfate 325 (65 FE) MG tablet, Take 1 tablet (325 mg total) by mouth at bedtime., Disp: 90 tablet, Rfl: 3   Vital signs in last 24 hrs: Vitals:   03/05/22 0921  BP: 130/70  Pulse: 60   Wt Readings from Last 3 Encounters:  03/05/22 177 lb (80.3 kg)  01/23/22 175 lb (79.4 kg)  12/25/21 175 lb 9.6 oz (79.7 kg)    Physical Exam  Well-appearing HEENT: sclera anicteric, oral mucosa moist without lesions Neck: supple, no thyromegaly, JVD or  lymphadenopathy Cardiac: Regular without appreciable murmur,  no peripheral edema Pulm: clear to auscultation bilaterally, normal RR and effort noted Abdomen: soft, no tenderness, with active bowel sounds.  No spleen tip palpable.  Left lobe liver felt on inspiration Skin; warm and dry, no jaundice or rash Neuro: Steady gait, normal mentation, normal gross motor function Labs:  AFP normal at 1.22 December 2021 ___________________________________________ Radiologic studies:  Most recent abdominal imaging was CT abdomen pelvis November 2023 ____________________________________________ Other:   _____________________________________________ Assessment & Plan  Assessment: Encounter Diagnoses  Name Primary?   Liver cirrhosis secondary to NASH [facility]) Yes   Chronic gastric ulcer without hemorrhage and without perforation    Secondary esophageal varices without bleeding (HCC)    No ascites/edema, no hepatic encephalopathy, no history of GI bleeding.  Compensated disease with grade 3 esophageal varices, now on primary beta-blocker prophylaxis. Up-to-date on [place] screening  Incidental small NSAID related gastric ulcer on recent EGD, likely healed or nearly healed at this point.  She stopped taking NSAIDs and uses occasional Tylenol.  We discussed the 2000 mg daily limit of Tylenol in a patient with cirrhosis, and it sounds like she takes nowhere near that.  Plan: Elizebeth Koller out current prescription of pantoprazole and then stop  Continue nadolol at current dose.  Pulse about 60 confirmed on my exam, so cannot increase dose.  Other antihypertensive management per primary care.  Check hepatitis A antibody, then make recommendation regarding hepatitis A and/or B vaccination with our clinic.  See me in about 4 months or call sooner if needed.   Nelida Meuse III

## 2022-03-06 LAB — HEPATITIS A ANTIBODY, TOTAL: Hepatitis A AB,Total: NONREACTIVE

## 2022-03-07 ENCOUNTER — Encounter: Payer: Medicare Other | Admitting: Internal Medicine

## 2022-03-08 ENCOUNTER — Other Ambulatory Visit: Payer: Medicare Other

## 2022-03-20 ENCOUNTER — Other Ambulatory Visit: Payer: Self-pay | Admitting: Gastroenterology

## 2022-03-20 ENCOUNTER — Other Ambulatory Visit: Payer: Self-pay

## 2022-03-20 DIAGNOSIS — E1169 Type 2 diabetes mellitus with other specified complication: Secondary | ICD-10-CM

## 2022-03-25 ENCOUNTER — Encounter: Payer: Self-pay | Admitting: Podiatry

## 2022-03-28 ENCOUNTER — Encounter: Payer: Self-pay | Admitting: Gastroenterology

## 2022-04-05 ENCOUNTER — Telehealth: Payer: Self-pay | Admitting: Gastroenterology

## 2022-04-05 NOTE — Telephone Encounter (Signed)
Inbound call from patient requesting a call back to discuss Shingles shot and Hep shots. Please advise.

## 2022-04-05 NOTE — Telephone Encounter (Signed)
Returned call to patient. Pt informed me that she received her 1st Shingrix vaccine about a month ago and wanted to make sure she didn't have to receive the 2nd one before starting Hepatitis vaccines. I informed patient that she is done to proceed with Hepatitis vaccines as scheduled, she does not have to complete Shingrix series prior to this. Pt verbalized understanding and had no concerns at the end of the call.

## 2022-04-08 ENCOUNTER — Ambulatory Visit: Payer: Medicare HMO

## 2022-04-16 ENCOUNTER — Encounter: Payer: Self-pay | Admitting: Gastroenterology

## 2022-04-24 ENCOUNTER — Ambulatory Visit: Payer: Medicare Other | Admitting: Podiatry

## 2022-04-24 ENCOUNTER — Encounter: Payer: Self-pay | Admitting: Internal Medicine

## 2022-04-25 ENCOUNTER — Other Ambulatory Visit: Payer: Self-pay | Admitting: Family Medicine

## 2022-04-25 DIAGNOSIS — Z1231 Encounter for screening mammogram for malignant neoplasm of breast: Secondary | ICD-10-CM

## 2022-04-29 ENCOUNTER — Ambulatory Visit
Admission: RE | Admit: 2022-04-29 | Discharge: 2022-04-29 | Disposition: A | Discharge status: Home / Self Care | Payer: Medicare HMO | Source: Ambulatory Visit | Attending: Family Medicine | Admitting: Family Medicine

## 2022-04-29 DIAGNOSIS — Z1231 Encounter for screening mammogram for malignant neoplasm of breast: Secondary | ICD-10-CM

## 2022-04-30 ENCOUNTER — Encounter: Payer: Self-pay | Admitting: Podiatry

## 2022-04-30 ENCOUNTER — Ambulatory Visit: Payer: Medicare HMO | Admitting: Podiatry

## 2022-04-30 VITALS — BP 139/68

## 2022-04-30 DIAGNOSIS — M79674 Pain in right toe(s): Secondary | ICD-10-CM | POA: Diagnosis not present

## 2022-04-30 DIAGNOSIS — E119 Type 2 diabetes mellitus without complications: Secondary | ICD-10-CM

## 2022-04-30 DIAGNOSIS — B351 Tinea unguium: Secondary | ICD-10-CM | POA: Diagnosis not present

## 2022-04-30 DIAGNOSIS — M79675 Pain in left toe(s): Secondary | ICD-10-CM

## 2022-04-30 NOTE — Progress Notes (Signed)
  Subjective:  Patient ID: Alyssa Haynes, female    DOB: January 26, 1946,  MRN: [id number]  Chief Complaint  Patient presents with   Nail Problem    [facility] BS-did not check today A1C-8.6 [facility]CP[facility]([place] PCP VST-2 weeks ago    Alyssa Haynes presents to clinic today for:  Chief Complaint  Patient presents with   Nail Problem    [facility] BS-did not check today A1C-8.6 [facility]CP[facility]([place]) PCP VST-2 weeks ago  .  PCP is Margretta Sidle, MD.  Allergies  Allergen Reactions   Other Itching and Rash    Wool     Review of Systems: Negative except as noted in the HPI.  Objective:  Vitals:   04/30/22 0917  BP: 139/68    Alyssa Haynes is a pleasant 77 y.o. female in NAD. AAO x 3.  Vascular Examination: Capillary refill time <3 seconds b/l LE. Palpable pedal pulses b/l LE. Digital hair present b/l. No pedal edema b/l. Skin temperature gradient WNL b/l. No varicosities b/l. No cyanosis or clubbing noted b/l LE.Marland Kitchen  Dermatological Examination: Pedal skin with normal turgor, texture and tone b/l. No open wounds. No interdigital macerations b/l. Toenails 1-5 b/l thickened, discolored, dystrophic with subungual debris. There is pain on palpation to dorsal aspect of nailplates. No hyperkeratotic nor porokeratotic lesions present on today's visit.Marland Kitchen  Neurological Examination: Protective sensation intact with 10 gram monofilament b/l LE. Vibratory sensation intact b/l LE.   Musculoskeletal Examination: Muscle strength 5/5 to all LE muscle groups b/l. HAV with bunion bilaterally and hammertoes 2-5 b/l.     Latest Ref Rng & Units 07/05/2021    8:58 AM  Hemoglobin A1C  Hemoglobin-A1c 4.0 - 5.6 % 10.2    Assessment/Plan: 1. Pain due to onychomycosis of toenails of both feet   2. Type 2 diabetes mellitus without complication, without long-term current use of insulin ([place])     -Consent given for treatment as described below: -Examined patient. -Continue foot  and shoe inspections daily. Monitor blood glucose per PCP/Endocrinologist's recommendations. -Patient to continue soft, supportive shoe gear daily. -Mycotic toenails 1-5 bilaterally were debrided in length and girth with sterile nail nippers and dremel without incident. -Patient/POA to call should there be question/concern in the interim.   Return in about 3 months (around 07/31/2022).  Marzetta Board, DPM

## 2022-05-02 ENCOUNTER — Encounter: Payer: Self-pay | Admitting: Gastroenterology

## 2022-05-08 ENCOUNTER — Other Ambulatory Visit: Payer: Medicare HMO

## 2022-05-08 ENCOUNTER — Ambulatory Visit (INDEPENDENT_AMBULATORY_CARE_PROVIDER_SITE_OTHER): Payer: Medicare HMO | Admitting: Gastroenterology

## 2022-05-08 DIAGNOSIS — K7460 Unspecified cirrhosis of liver: Secondary | ICD-10-CM | POA: Diagnosis not present

## 2022-05-08 DIAGNOSIS — K7581 Nonalcoholic steatohepatitis (NASH): Secondary | ICD-10-CM

## 2022-05-08 NOTE — Progress Notes (Signed)
Patient arrived at the office to receive her 2nd Heplisav B vaccine. I reviewed common side effects with patient. Patient tolerated vaccine well. No complications noted at this time.

## 2022-06-19 ENCOUNTER — Encounter: Payer: Self-pay | Admitting: Gastroenterology

## 2022-07-24 ENCOUNTER — Ambulatory Visit: Payer: Medicare HMO | Admitting: Podiatry

## 2022-07-25 ENCOUNTER — Ambulatory Visit (INDEPENDENT_AMBULATORY_CARE_PROVIDER_SITE_OTHER): Payer: Medicare HMO

## 2022-07-25 ENCOUNTER — Encounter: Payer: Self-pay | Admitting: Podiatry

## 2022-07-25 ENCOUNTER — Ambulatory Visit: Payer: Medicare HMO | Admitting: Podiatry

## 2022-07-25 DIAGNOSIS — M79672 Pain in left foot: Secondary | ICD-10-CM

## 2022-07-25 DIAGNOSIS — M7752 Other enthesopathy of left foot: Secondary | ICD-10-CM

## 2022-07-25 MED ORDER — TRIAMCINOLONE ACETONIDE 10 MG/ML IJ SUSP
10.0000 mg | Freq: Once | INTRAMUSCULAR | Status: AC
  Administered 2022-07-25: 10 mg

## 2022-07-25 NOTE — Progress Notes (Signed)
Subjective:   Patient ID: Alyssa Haynes, female   DOB: 77 y.o.   MRN: [id number]   HPI Patient presents stating she is developed a lot of pain in her left ankle over the last 6 months and that it has worsened over the last couple months.   ROS      Objective:  Physical Exam  Neurovascular status intact exquisite discomfort mostly in the sinus tarsi left inflammation fluid buildup around the area with pain upon pressure     Assessment:  Inflammatory capsulitis of the sinus tarsi left fluid buildup     Plan:  H&P reviewed and discussed x-ray and went ahead today did sterile prep injected the sinus tarsi left 3 mg Kenalog 5 mg Xylocaine advised on wearing the brace that she has and reappoint if symptoms indicate  X-rays indicate no signs of bony injury or fracture or arthritis associated with condition

## 2022-08-16 ENCOUNTER — Other Ambulatory Visit: Payer: Self-pay | Admitting: Podiatry

## 2022-08-16 DIAGNOSIS — M7752 Other enthesopathy of left foot: Secondary | ICD-10-CM

## 2022-08-16 DIAGNOSIS — M79672 Pain in left foot: Secondary | ICD-10-CM

## 2022-08-19 ENCOUNTER — Other Ambulatory Visit: Payer: Self-pay | Admitting: Student

## 2022-08-27 ENCOUNTER — Ambulatory Visit (INDEPENDENT_AMBULATORY_CARE_PROVIDER_SITE_OTHER): Payer: Medicare HMO | Admitting: Podiatry

## 2022-08-27 ENCOUNTER — Encounter: Payer: Self-pay | Admitting: Podiatry

## 2022-08-27 VITALS — BP 166/77

## 2022-08-27 DIAGNOSIS — M79675 Pain in left toe(s): Secondary | ICD-10-CM

## 2022-08-27 DIAGNOSIS — B351 Tinea unguium: Secondary | ICD-10-CM | POA: Diagnosis not present

## 2022-08-27 DIAGNOSIS — M79674 Pain in right toe(s): Secondary | ICD-10-CM

## 2022-08-27 DIAGNOSIS — E119 Type 2 diabetes mellitus without complications: Secondary | ICD-10-CM | POA: Diagnosis not present

## 2022-08-27 NOTE — Progress Notes (Signed)
  Subjective:  Patient ID: Alyssa Haynes, female    DOB: [birth year],  MRN: [id number]  Alyssa Haynes presents to clinic today for preventative diabetic foot care and painful elongated mycotic toenails 1-5 bilaterally which are tender when wearing enclosed shoe gear. Pain is relieved with periodic professional debridement.  Chief Complaint  Patient presents with   Nail Problem    [facility] pt also states she cut her 3rd toe nail left foot and now it is growing toward her 2nd toe   New problem(s): None.   PCP is Lula Olszewski, MD.  Allergies  Allergen Reactions   Other Itching and Rash    Wool     Review of Systems: Negative except as noted in the HPI.  Objective: No changes noted in today's physical examination. Vitals:   08/27/22 0835  BP: (!) 166/77   Alyssa Haynes is a pleasant 77 y.o. female WD, WN in NAD. AAO x 3.  Vascular Examination: Capillary refill time <3 seconds b/l LE. Palpable pedal pulses b/l LE. Digital hair present b/l. No pedal edema b/l. Skin temperature gradient WNL b/l. No varicosities b/l. No cyanosis or clubbing noted b/l LE.Marland Kitchen  Dermatological Examination: Pedal skin with normal turgor, texture and tone b/l. No open wounds. No interdigital macerations b/l. Toenails 1-5 b/l thickened, discolored, dystrophic with subungual debris. There is pain on palpation to dorsal aspect of nailplates. No hyperkeratotic nor porokeratotic lesions present on today's visit.Marland Kitchen  Neurological Examination: Protective sensation intact with 10 gram monofilament b/l LE. Vibratory sensation intact b/l LE.   Musculoskeletal Examination: Muscle strength 5/5 to all LE muscle groups b/l. HAV with bunion bilaterally and hammertoes 2-5 b/l.  Assessment/Plan: 1. Pain due to onychomycosis of toenails of both feet   2. Type 2 diabetes mellitus without complication, without long-term current use of insulin (HCC)    -Consent given for treatment as described below: -Examined  patient. -Patient to continue soft, supportive shoe gear daily. -Toenails 1-5 b/l were debrided in length and girth with sterile nail nippers and dremel without iatrogenic bleeding.  -Patient/POA to call should there be question/concern in the interim.   Return in about 3 months (around 11/27/2022).  Freddie Breech, DPM

## 2022-09-09 ENCOUNTER — Other Ambulatory Visit: Payer: Medicare HMO

## 2022-09-09 ENCOUNTER — Ambulatory Visit (INDEPENDENT_AMBULATORY_CARE_PROVIDER_SITE_OTHER): Payer: Medicare HMO | Admitting: Gastroenterology

## 2022-09-09 DIAGNOSIS — Z23 Encounter for immunization: Secondary | ICD-10-CM

## 2022-09-09 DIAGNOSIS — K7460 Unspecified cirrhosis of liver: Secondary | ICD-10-CM

## 2022-11-21 ENCOUNTER — Encounter: Payer: Self-pay | Admitting: Gastroenterology

## 2022-12-03 ENCOUNTER — Encounter: Payer: Self-pay | Admitting: Podiatry

## 2022-12-03 ENCOUNTER — Ambulatory Visit (INDEPENDENT_AMBULATORY_CARE_PROVIDER_SITE_OTHER): Payer: Medicare HMO | Admitting: Podiatry

## 2022-12-03 ENCOUNTER — Ambulatory Visit: Payer: Medicare HMO | Admitting: Podiatry

## 2022-12-03 DIAGNOSIS — B351 Tinea unguium: Secondary | ICD-10-CM | POA: Diagnosis not present

## 2022-12-03 DIAGNOSIS — M79675 Pain in left toe(s): Secondary | ICD-10-CM

## 2022-12-03 DIAGNOSIS — M79674 Pain in right toe(s): Secondary | ICD-10-CM | POA: Diagnosis not present

## 2022-12-03 DIAGNOSIS — E119 Type 2 diabetes mellitus without complications: Secondary | ICD-10-CM

## 2022-12-03 NOTE — Progress Notes (Signed)
  Subjective:  Patient ID: Alyssa Haynes, female    DOB: 12-24-1945,   MRN: [id number]  No chief complaint on file.   77 y.o. female presents for concern of thickened elongated and painful nails that are difficult to trim. Requesting to have them trimmed today. Relates burning and tingling in their feet. Patient is diabetic and last A1c was  Lab Results  Component Value Date   HGBA1C 10.2 (A) 07/05/2021   . Does relate a newer A1c that was around 7. Something.   PCP:  Lula Olszewski, MD    . Denies any other pedal complaints. Denies n/v/f/c.   Past Medical History:  Diagnosis Date   Anxiety attack 08/22/2017   Arthritis    Asthma    Benign ovarian cyst 07/10/2016   Bronchitis 06/22/2009   Qualifier: Diagnosis of  By: Comer Locket MD, Vijay     Cataract    Cirrhosis (HCC)    Diabetes mellitus    GERD (gastroesophageal reflux disease)    Hyperlipidemia    Hypertension    Osteoporosis    Thrombocytopenia (HCC) 02/28/2021   Associated with cirrhosis    Objective:  Physical Exam: Vascular: DP/PT pulses 2/4 bilateral. CFT <3 seconds. Absent hair growth on digits. Edema noted to bilateral lower extremities. Xerosis noted bilaterally.  Skin. No lacerations or abrasions bilateral feet. Nails 1-5 bilateral  are thickened discolored and elongated with subungual debris.  Musculoskeletal: MMT 5/5 bilateral lower extremities in DF, PF, Inversion and Eversion. Deceased ROM in DF of ankle joint.  Neurological: Sensation intact to light touch. Protective sensation diminished bilateral.    Assessment:   1. Pain due to onychomycosis of toenails of both feet   2. Type 2 diabetes mellitus without complication, without long-term current use of insulin (HCC)   3. Encounter for diabetic foot exam (HCC)      Plan:  Patient was evaluated and treated and all questions answered. -Discussed and educated patient on diabetic foot care, especially with  regards to the vascular, neurological and  musculoskeletal systems.  -Stressed the importance of good glycemic control and the detriment of not  controlling glucose levels in relation to the foot. -Discussed supportive shoes at all times and checking feet regularly.  -Mechanically debrided all nails 1-5 bilateral using sterile nail nipper and filed with dremel without incident  -Answered all patient questions -Patient will consider removal of her left third digit nail in the future. Will let us know if she would like it removed.  -Patient to return  in 3 months for at risk foot care -Patient advised to call the office if any problems or questions arise in the meantime.   Louann Sjogren, DPM

## 2022-12-25 ENCOUNTER — Telehealth: Payer: Self-pay | Admitting: Podiatry

## 2022-12-25 NOTE — Telephone Encounter (Signed)
Pt called stating she talked with someone this morning and they were to have called her back about the insoles she was measured for beginning of the yr.  Upon checking I did not see a note.  Spoke to pt after checking [facility] and the doctor did not sign off on the notes and that is why they never came in. I explained that Nicki Guadalajara wa probably checking to see if [employer] had the impressions still and pt stated that she has had some changes in her feet since then.Discuss that if she has had changes it would probably be best for her to come in and be rescanned/molded for the insoles and pick out new shoes. She agreed and is scheduled for 12/2. She also asked about billing for the last ones and I told her that we do not bill for them per medicare until pt picks them up so they were never billed. She said ok and thank you.

## 2023-01-08 ENCOUNTER — Encounter: Payer: Self-pay | Admitting: Gastroenterology

## 2023-01-09 NOTE — Telephone Encounter (Signed)
She needs to follow-up with me anyway regarding her cirrhosis, and we could address this as well.  Please make her an in-person clinic visit with me in January or February.  Meanwhile, she can take OTC famotidine 20 mg when needed if that seems to help.  H Danis

## 2023-01-13 NOTE — Telephone Encounter (Signed)
Pt was scheduled for an office visit on 04/11/2023 at 10:00 AM with Dr. Myrtie Neither.  Pt made aware.  Pt verbalized understanding with all questions answered.

## 2023-01-20 ENCOUNTER — Ambulatory Visit: Payer: Medicare HMO

## 2023-01-20 DIAGNOSIS — M7752 Other enthesopathy of left foot: Secondary | ICD-10-CM

## 2023-01-20 DIAGNOSIS — M2041 Other hammer toe(s) (acquired), right foot: Secondary | ICD-10-CM

## 2023-01-20 DIAGNOSIS — E119 Type 2 diabetes mellitus without complications: Secondary | ICD-10-CM

## 2023-01-20 DIAGNOSIS — M2011 Hallux valgus (acquired), right foot: Secondary | ICD-10-CM

## 2023-01-20 NOTE — Progress Notes (Signed)
Patient presents to the office today for diabetic shoe and insole measuring.  Patient was measured with brannock device to determine size and width for 1 pair of extra depth shoes and foam casted for 3 pair of insoles.   Documentation of medical necessity will be sent to patient's treating diabetic doctor to verify and sign.   Patient's diabetic provider: [name]   Shoes and insoles will be ordered at that time and patient will be notified for an appointment for fitting when they arrive.   Shoe size (per patient): 10 Brannock measurement: 10 Patient shoe selection- Shoe choice:   [id number]   Shoe size ordered: 10.5MD  Alyssa Haynes Cped, CFo, CFm [employer] signed

## 2023-01-23 ENCOUNTER — Ambulatory Visit
Admission: EM | Admit: 2023-01-23 | Discharge: 2023-01-23 | Disposition: A | Discharge status: Home / Self Care | Payer: Medicare HMO | Attending: Emergency Medicine | Admitting: Emergency Medicine

## 2023-01-23 DIAGNOSIS — J069 Acute upper respiratory infection, unspecified: Secondary | ICD-10-CM | POA: Diagnosis not present

## 2023-01-23 LAB — POC COVID19/FLU A&B COMBO
Covid Antigen, POC: NEGATIVE
Influenza A Antigen, POC: NEGATIVE
Influenza B Antigen, POC: NEGATIVE

## 2023-01-23 NOTE — ED Provider Notes (Signed)
[facility]    CSN: [id number] Arrival date & time: 01/23/23  1031      History   Chief Complaint No chief complaint on file.   HPI Alyssa Haynes is a 77 y.o. female.   Patient presents with concerns of feeling unwell since yesterday. She reports headache, body aches, nasal congestion, sore throat, cough, and fatigue. She denies known fever. The patient reports her mouth and throat have felt dry for the past week or so. She reports history of asthma and bronchitis but denies any wheezing or shortness of breath. She has been taking OTC medicine with minimal improvement.   The history is provided by the patient.    Past Medical History:  Diagnosis Date   Anxiety attack 08/22/2017   Arthritis    Asthma    Benign ovarian cyst 07/10/2016   Bronchitis 06/22/2009   Qualifier: Diagnosis of  By: Comer Locket MD, Vijay     Cataract    Cirrhosis (HCC)    Diabetes mellitus    GERD (gastroesophageal reflux disease)    Hyperlipidemia    Hypertension    Osteoporosis    Thrombocytopenia (HCC) 02/28/2021   Associated with cirrhosis    Patient Active Problem List   Diagnosis Date Noted   Cognitive decline 07/05/2021   Trigger finger of right thumb 03/01/2021   Osteoarthritis of right hand 03/01/2021   Thrombocytopenia (HCC) 02/28/2021   Diverticular disease of colon, incidental, asymptomatic 07/10/2020   Cirrhosis, non-alcoholic (HCC) 10/27/2019   Leg cramps/leg pain (documentation is under various problems) 09/18/2019   Osteopenia 11/05/2017   Generalized anxiety disorder with panic attacks 08/22/2017   Osteoarthritis of left hip 07/23/2017   Urinary incontinence, urge (nocturnal) 09/25/2016   Benign ovarian cyst 07/10/2016   Chronic venous insufficiency 03/04/2014   Intrinsic asthma 09/02/2008   Type 2 diabetes mellitus without complications (HCC) 03/12/2006   Hyperlipidemia associated with type 2 diabetes mellitus (HCC) 03/12/2006   Essential hypertension 03/12/2006     Past Surgical History:  Procedure Laterality Date   CESAREAN SECTION  1988   CHOLECYSTECTOMY  2006   COLONOSCOPY     8-9- years ago- unsure of MD   FRACTURE SURGERY  1992   lEFT ANKLE orif   plantar fasciitis Right    release   TUBAL LIGATION  1988    OB History   No obstetric history on file.      Home Medications    Prior to Admission medications   Medication Sig Start Date End Date Taking? Authorizing Provider  atorvastatin (LIPITOR) 20 MG tablet Take 1 tablet by mouth once daily 03/19/21   Miguel Aschoff, MD  Biotin 1 MG CAPS Take 1 mg by mouth daily. 03/26/19   Inez Catalina, MD  Blood Glucose Monitoring Suppl (ACCU-CHEK AVIVA PLUS) w/Device KIT Use to check blood sugar 2 times daily before meals. diag code E11.9. noninsulin dependent 07/17/17   Levert Feinstein, MD  Elderberry 575 MG/5ML SYRP Take 1 drop by mouth daily.    [provider]  empagliflozin (JARDIANCE) 25 MG TABS tablet Take 1 tablet (25 mg total) by mouth daily before breakfast. 01/24/21   Miguel Aschoff, MD  ferrous sulfate 325 (65 FE) MG tablet Take 1 tablet (325 mg total) by mouth at bedtime. 09/18/19 01/23/22  Eliezer Bottom, MD  FLOVENT HFA 44 MCG/ACT inhaler INHALE 1 PUFF BY MOUTH TWICE DAILY 12/05/21   Miguel Aschoff, MD  glucose blood (ACCU-CHEK AVIVA PLUS) test strip Use  to check blood sugar 2 times daily. diag code E11.9. noninsulin dependent 11/10/18   Inez Catalina, MD  Magnesium 400 MG CAPS Take by mouth daily.    [provider]  metFORMIN (GLUCOPHAGE) 1000 MG tablet Take 1 tablet (1,000 mg total) by mouth 2 (two) times daily with a meal. 02/14/22   Katsadouros, Vasilios, MD  Multiple Vitamin (MULTIVITAMIN) LIQD Take 5 mLs by mouth daily.    [provider]  nadolol (CORGARD) 20 MG tablet Take 20 mg by mouth daily. 02/02/22   [provider]  olmesartan (BENICAR) 5 MG tablet Take 5 mg by mouth daily. 02/04/22   [provider]     Family History Family History  Problem Relation Age of Onset   Cancer Mother 46       lUNG CANCER   Osteoporosis Mother    Heart disease Father 66   Heart attack Father    Gout Father    Alcoholism Brother    Diabetes Daughter    Diabetes Daughter    Asthma Daughter    Asthma Son    Breast cancer Neg Hx    Colon cancer Neg Hx    Esophageal cancer Neg Hx    Rectal cancer Neg Hx    Stomach cancer Neg Hx     Social History Social History   Tobacco Use   Smoking status: Former    Current packs/day: 0.00    Types: Cigarettes    Quit date: 02/19/1995    Years since quitting: 27.9   Smokeless tobacco: Never  Vaping Use   Vaping status: Never Used  Substance Use Topics   Alcohol use: No    Alcohol/week: 0.0 standard drinks of alcohol   Drug use: No     Allergies   Other   Review of Systems Review of Systems  Constitutional:  Positive for fatigue. Negative for fever.  HENT:  Positive for congestion, rhinorrhea and sore throat. Negative for ear pain and trouble swallowing.   Respiratory:  Positive for cough. Negative for chest tightness, shortness of breath and wheezing.   Cardiovascular:  Negative for chest pain.  Gastrointestinal:  Negative for diarrhea, nausea and vomiting.  Musculoskeletal:  Positive for myalgias.  Skin:  Negative for rash.  Neurological:  Positive for headaches. Negative for dizziness.     Physical Exam Triage Vital Signs ED Triage Vitals  Encounter Vitals Group     BP 01/23/23 1158 (!) 174/72     Systolic BP Percentile --      Diastolic BP Percentile --      Pulse Rate 01/23/23 1158 80     Resp 01/23/23 1158 18     Temp 01/23/23 1158 100.2 F (37.9 C)     Temp Source 01/23/23 1158 Oral     SpO2 01/23/23 1158 99 %     Weight 01/23/23 1157 168 lb (76.2 kg)     Height 01/23/23 1157 5\' 6"  (1.676 m)     Head Circumference --      Peak Flow --      Pain Score 01/23/23 1156 6     Pain Loc --      Pain Education --      Exclude  from Growth Chart --    No data found.  Updated Vital Signs BP (!) 174/72 (BP Location: Left Arm)   Pulse 80   Temp 100.2 F (37.9 C) (Oral)   Resp 18   Ht 5\' 6"  (1.676 m)  Wt 168 lb (76.2 kg)   SpO2 99%   BMI 27.12 kg/m   Visual Acuity Right Eye Distance:   Left Eye Distance:   Bilateral Distance:    Right Eye Near:   Left Eye Near:    Bilateral Near:     Physical Exam Vitals and nursing note reviewed.  Constitutional:      General: She is not in acute distress.    Appearance: She is ill-appearing.  HENT:     Head: Normocephalic.     Nose: Congestion and rhinorrhea present.     Mouth/Throat:     Mouth: Mucous membranes are moist.     Pharynx: Oropharynx is clear. No oropharyngeal exudate or posterior oropharyngeal erythema.  Eyes:     Conjunctiva/sclera: Conjunctivae normal.     Pupils: Pupils are equal, round, and reactive to light.  Cardiovascular:     Rate and Rhythm: Normal rate and regular rhythm.     Heart sounds: Normal heart sounds.  Pulmonary:     Effort: Pulmonary effort is normal. No respiratory distress.     Breath sounds: Normal breath sounds. No stridor. No wheezing, rhonchi or rales.  Musculoskeletal:     Cervical back: Normal range of motion.  Lymphadenopathy:     Cervical: No cervical adenopathy.  Skin:    Findings: No rash.  Neurological:     Mental Status: She is alert.     Gait: Gait normal.  Psychiatric:        Mood and Affect: Mood normal.      UC Treatments / Results  Labs (all labs ordered are listed, but only abnormal results are displayed) Labs Reviewed  POC COVID19/FLU A&B COMBO    EKG   Radiology No results found.  Procedures Procedures (including critical care time)  Medications Ordered in UC Medications - No data to display  Initial Impression / Assessment and Plan / UC Course  I have reviewed the triage vital signs and the nursing notes.  Pertinent labs & imaging results that were available during my  care of the patient were reviewed by me and considered in my medical decision making (see chart for details).     Neg flu and COVID. Hx of asthma but no evidence of exacerbation - no wheezing, SOB, or decrease in O2. Discussed sx tx - patient already has Tessalon at home - and close follow-up precautions.   E/M: 1 acute uncomplicated illness, 2 data, low risk    Final Clinical Impressions(s) / UC Diagnoses   Final diagnoses:  Viral URI     Discharge Instructions      Negative COVID and flu - these can be less accurate if you have had symptoms for less than 24 hours so recommend retesting in a couple days if still concerned. Continue with OTC medicine and the Tessalon you have at home. Return to care if no improvement in a week. Go to the ER if develop any difficulty breathing or chest pain.      ED Prescriptions   None    PDMP not reviewed this encounter.   Estanislado Pandy, [place] 01/23/23 1246

## 2023-01-23 NOTE — ED Triage Notes (Signed)
Patient presents with headache, mouth, chest and throat is dry, nasal yellow drainage, cough and body aches x day 2. Treated with Mucinex max and Nyquil without relief.

## 2023-01-23 NOTE — Discharge Instructions (Signed)
Negative COVID and flu - these can be less accurate if you have had symptoms for less than 24 hours so recommend retesting in a couple days if still concerned. Continue with OTC medicine and the Tessalon you have at home. Return to care if no improvement in a week. Go to the ER if develop any difficulty breathing or chest pain.

## 2023-01-27 ENCOUNTER — Ambulatory Visit
Admission: RE | Admit: 2023-01-27 | Discharge: 2023-01-27 | Disposition: A | Discharge status: Home / Self Care | Payer: Medicare HMO | Source: Ambulatory Visit | Attending: Family Medicine | Admitting: Family Medicine

## 2023-01-27 ENCOUNTER — Other Ambulatory Visit: Payer: Self-pay | Admitting: Family Medicine

## 2023-01-27 ENCOUNTER — Encounter: Payer: Self-pay | Admitting: Family Medicine

## 2023-01-27 DIAGNOSIS — R053 Chronic cough: Secondary | ICD-10-CM

## 2023-03-11 ENCOUNTER — Ambulatory Visit (INDEPENDENT_AMBULATORY_CARE_PROVIDER_SITE_OTHER): Payer: Medicare HMO | Admitting: Podiatry

## 2023-03-11 ENCOUNTER — Encounter: Payer: Self-pay | Admitting: Podiatry

## 2023-03-11 DIAGNOSIS — M79675 Pain in left toe(s): Secondary | ICD-10-CM

## 2023-03-11 DIAGNOSIS — B351 Tinea unguium: Secondary | ICD-10-CM | POA: Diagnosis not present

## 2023-03-11 DIAGNOSIS — E119 Type 2 diabetes mellitus without complications: Secondary | ICD-10-CM | POA: Diagnosis not present

## 2023-03-11 DIAGNOSIS — M79674 Pain in right toe(s): Secondary | ICD-10-CM | POA: Diagnosis not present

## 2023-03-11 DIAGNOSIS — S91112A Laceration without foreign body of left great toe without damage to nail, initial encounter: Secondary | ICD-10-CM | POA: Diagnosis not present

## 2023-03-11 NOTE — Progress Notes (Signed)
  Subjective:  Patient ID: Alyssa Haynes, female    DOB: Mar 28, 1945,  MRN: [id number]  78 y.o. female presents to clinic with  preventative diabetic foot care and painful elongated mycotic toenails 1-5 bilaterally which are tender when wearing enclosed shoe gear. Pain is relieved with periodic professional debridement.  Chief Complaint  Patient presents with   Diabetes    Patient last A1c is 8.3 , patient name sof PCP Billi, Reese. Patient last saw PCP about two weeks ago.    New problem(s):  Patient states she cut her left great toe attempting to trim her toenail. States she cleaned it with witch hazel  PCP is Lula Olszewski, MD.  Allergies  Allergen Reactions   Other Itching and Rash    Wool    Review of Systems: Negative except as noted in the HPI.   Objective:  Alyssa Haynes is a pleasant 78 y.o. female WD, WN in NAD. AAO x 3.  Vascular Examination: Vascular status intact b/l with palpable pedal pulses. CFT immediate b/l. No edema. No pain with calf compression b/l. Skin temperature gradient WNL b/l. No cyanosis or clubbing noted b/l LE.  Neurological Examination: Sensation grossly intact b/l with 10 gram monofilament. Vibratory sensation intact b/l.   Dermatological Examination: Small laceration lateral aspect of left hallux. No erythema, no edema, no drainage, no fluctuance. Pedal skin with normal turgor, texture and tone b/l. Toenails 1-5 b/l thick, discolored, elongated with subungual debris and pain on dorsal palpation. No hyperkeratotic lesions noted b/l.   Musculoskeletal Examination: Muscle strength 5/5 to b/l LE. HAV with bunion deformity noted b/l LE. Hammertoe deformity noted 2-5 b/l.  Radiographs: None  Last A1c:       No data to display           Assessment:   1. Pain due to onychomycosis of toenails of both feet   2. Laceration of left great toe without foreign body present or damage to nail, initial encounter   3. Type 2 diabetes mellitus  without complication, without long-term current use of insulin (HCC)    Plan:  -Patient was evaluated today. All questions/concerns addressed on today's visit. -Patient instructed to apply Neosporin Cream to left great toe once daily until healed. -Continue supportive shoe gear daily. -Mycotic toenails 1-5 bilaterally were debrided in length and girth with sterile nail nippers and dremel without incident. -Patient/POA to call should there be question/concern in the interim.  Return in about 3 months (around 06/09/2023).  Freddie Breech, DPM      [facility] LOCATION: [address] [place] [address]                   Office [phone]   [facility] LOCATION: [address] [place], [place] [address] Office [phone]

## 2023-03-12 ENCOUNTER — Ambulatory Visit: Payer: Medicare HMO

## 2023-03-13 ENCOUNTER — Other Ambulatory Visit: Payer: Medicare HMO

## 2023-03-13 ENCOUNTER — Other Ambulatory Visit: Payer: Self-pay

## 2023-03-13 ENCOUNTER — Ambulatory Visit (INDEPENDENT_AMBULATORY_CARE_PROVIDER_SITE_OTHER): Payer: Medicare HMO | Admitting: Gastroenterology

## 2023-03-13 DIAGNOSIS — Z23 Encounter for immunization: Secondary | ICD-10-CM | POA: Diagnosis not present

## 2023-04-04 ENCOUNTER — Other Ambulatory Visit: Payer: Self-pay | Admitting: Family Medicine

## 2023-04-04 DIAGNOSIS — Z1231 Encounter for screening mammogram for malignant neoplasm of breast: Secondary | ICD-10-CM

## 2023-04-10 ENCOUNTER — Other Ambulatory Visit: Payer: Medicare HMO

## 2023-04-11 ENCOUNTER — Other Ambulatory Visit (INDEPENDENT_AMBULATORY_CARE_PROVIDER_SITE_OTHER): Payer: Medicare HMO

## 2023-04-11 ENCOUNTER — Encounter: Payer: Self-pay | Admitting: Gastroenterology

## 2023-04-11 ENCOUNTER — Ambulatory Visit: Payer: Medicare HMO | Admitting: Gastroenterology

## 2023-04-11 VITALS — BP 124/70 | HR 68 | Ht 66.0 in | Wt 166.0 lb

## 2023-04-11 DIAGNOSIS — K7581 Nonalcoholic steatohepatitis (NASH): Secondary | ICD-10-CM | POA: Diagnosis not present

## 2023-04-11 DIAGNOSIS — I8510 Secondary esophageal varices without bleeding: Secondary | ICD-10-CM

## 2023-04-11 DIAGNOSIS — K7460 Unspecified cirrhosis of liver: Secondary | ICD-10-CM | POA: Diagnosis not present

## 2023-04-11 DIAGNOSIS — K219 Gastro-esophageal reflux disease without esophagitis: Secondary | ICD-10-CM | POA: Diagnosis not present

## 2023-04-11 LAB — CBC WITH DIFFERENTIAL/PLATELET
Basophils Absolute: 0 10*3/uL (ref 0.0–0.1)
Basophils Relative: 0.5 % (ref 0.0–3.0)
Eosinophils Absolute: 0.2 10*3/uL (ref 0.0–0.7)
Eosinophils Relative: 6.1 % — ABNORMAL HIGH (ref 0.0–5.0)
HCT: 39.3 % (ref 36.0–46.0)
Hemoglobin: 12.9 g/dL (ref 12.0–15.0)
Lymphocytes Relative: 24 % (ref 12.0–46.0)
Lymphs Abs: 0.9 10*3/uL (ref 0.7–4.0)
MCHC: 32.9 g/dL (ref 30.0–36.0)
MCV: 93.5 fL (ref 78.0–100.0)
Monocytes Absolute: 0.4 10*3/uL (ref 0.1–1.0)
Monocytes Relative: 11.9 % (ref 3.0–12.0)
Neutro Abs: 2.1 10*3/uL (ref 1.4–7.7)
Neutrophils Relative %: 57.5 % (ref 43.0–77.0)
Platelets: 131 10*3/uL — ABNORMAL LOW (ref 150.0–400.0)
RBC: 4.2 Mil/uL (ref 3.87–5.11)
RDW: 14.8 % (ref 11.5–15.5)
WBC: 3.7 10*3/uL — ABNORMAL LOW (ref 4.0–10.5)

## 2023-04-11 LAB — COMPREHENSIVE METABOLIC PANEL
ALT: 26 U/L (ref 0–35)
AST: 37 U/L (ref 0–37)
Albumin: 4.3 g/dL (ref 3.5–5.2)
Alkaline Phosphatase: 57 U/L (ref 39–117)
BUN: 11 mg/dL (ref 6–23)
CO2: 26 meq/L (ref 19–32)
Calcium: 9.7 mg/dL (ref 8.4–10.5)
Chloride: 104 meq/L (ref 96–112)
Creatinine, Ser: 0.69 mg/dL (ref 0.40–1.20)
GFR: 84.79 mL/min (ref >60.00)
Glucose, Bld: 184 mg/dL — ABNORMAL HIGH (ref 70–99)
Potassium: 3.6 meq/L (ref 3.5–5.1)
Sodium: 141 meq/L (ref 135–145)
Total Bilirubin: 0.8 mg/dL (ref 0.2–1.2)
Total Protein: 7.3 g/dL (ref 6.0–8.3)

## 2023-04-11 LAB — PROTIME-INR
INR: 1.3 {ratio} — ABNORMAL HIGH (ref 0.8–1.0)
Prothrombin Time: 13.8 s — ABNORMAL HIGH (ref 9.6–13.1)

## 2023-04-11 NOTE — Progress Notes (Signed)
 [facility] Progress Note  Chief Complaint: Cirrhosis and gastroesophageal reflux  Subjective  Prior history  Fatty liver related cirrhosis, first evaluated in our office November 2023 and then upper endoscopy December 2023 revealing grade 3 esophageal varices.  Care coordinated with her PCP to begin nonselective beta-blocker.  See subsequent chart notes that her home losartan dose was decreased and she was started on nadolol 20 mg daily with instructions to follow-up blood pressure regularly with primary care. She also sent messages regarding nausea and rash occurring around the same time she started pantoprazole, which seemed unlikely side effect from that meds and she was instructed to be evaluated by primary care.  The PPI restarted because of a small NSAID related ulcer on the EGD (H. pylori negative). As of Jan 2024 visit, No ascites/edema, no hepatic encephalopathy, no history of GI bleeding. Compensated disease with grade 3 esophageal varices, now on primary beta-blocker prophylaxis.   Given HAV and HBV vaccine series in 2024/early 2025  Nov 2024 portal message re: belching and GERD symptoms that she treated with her husband's famotidine  ______________________  Alyssa Haynes was here with her son today, and wanted to discuss her cirrhosis and some recent reflux issues. As noted above, she contacted [place] November 2020 for having some regular episodes of reflux with belching regurgitation and heartburn, often occurring at night.  She had relief taking her husband's Pepcid, then switched to taking 2 Tums at bedtime.  This seems to have resolved the symptoms, she does not sense any reflux occurring overnight or wake up with feelings of regurgitation or sore throat.  Denies dysphagia or odynophagia.  She describes some gassiness and bloating, which has been a chronic issue for her.  Bowel habits stable Take on all of her prescribed medicines regularly.   ROS: Cardiovascular:  no chest  pain Respiratory: no dyspnea  The patient's Past Medical, Family and Social History were reviewed and are on file in the EMR. Past Medical History:  Diagnosis Date   Anxiety attack 08/22/2017   Arthritis    Asthma    Benign ovarian cyst 07/10/2016   Bronchitis 06/22/2009   Qualifier: Diagnosis of  By: Comer Locket MD, Vijay     Cataract    Cirrhosis (HCC)    Diabetes mellitus    GERD (gastroesophageal reflux disease)    Hyperlipidemia    Hypertension    Osteoporosis    Thrombocytopenia (HCC) 02/28/2021   Associated with cirrhosis    Past Surgical History:  Procedure Laterality Date   CESAREAN SECTION  1988   CHOLECYSTECTOMY  2006   COLONOSCOPY     8-9- years ago- unsure of MD   FRACTURE SURGERY  1992   lEFT ANKLE orif   plantar fasciitis Right    release   TUBAL LIGATION  1988     Objective:  Med list reviewed  Current Outpatient Medications:    atorvastatin (LIPITOR) 20 MG tablet, Take 1 tablet by mouth once daily, Disp: 90 tablet, Rfl: 3   Biotin 1 MG CAPS, Take 1 mg by mouth daily., Disp: 30 capsule, Rfl: 0   Blood Glucose Monitoring Suppl (ACCU-CHEK AVIVA PLUS) w/Device KIT, Use to check blood sugar 2 times daily before meals. diag code E11.9. noninsulin dependent, Disp: 1 kit, Rfl: 0   Elderberry 575 MG/5ML SYRP, Take 1 drop by mouth daily., Disp: , Rfl:    empagliflozin (JARDIANCE) 25 MG TABS tablet, Take 1 tablet (25 mg total) by mouth daily before breakfast., Disp: 21  tablet, Rfl: 0   FLOVENT HFA 44 MCG/ACT inhaler, INHALE 1 PUFF BY MOUTH TWICE DAILY, Disp: 11 g, Rfl: 0   glucose blood (ACCU-CHEK AVIVA PLUS) test strip, Use to check blood sugar 2 times daily. diag code E11.9. noninsulin dependent, Disp: 200 each, Rfl: 1   Magnesium 400 MG CAPS, Take by mouth daily., Disp: , Rfl:    metFORMIN (GLUCOPHAGE) 500 MG tablet, Take 500 mg by mouth 2 (two) times daily with a meal., Disp: , Rfl:    Multiple Vitamin (MULTIVITAMIN) LIQD, Take 5 mLs by mouth daily., Disp: , Rfl:     nadolol (CORGARD) 20 MG tablet, Take 20 mg by mouth daily., Disp: , Rfl:    olmesartan (BENICAR) 5 MG tablet, Take 5 mg by mouth daily., Disp: , Rfl:    ferrous sulfate 325 (65 FE) MG tablet, Take 1 tablet (325 mg total) by mouth at bedtime., Disp: 90 tablet, Rfl: 3   metFORMIN (GLUCOPHAGE) 1000 MG tablet, Take 1 tablet (1,000 mg total) by mouth 2 (two) times daily with a meal. (Patient not taking: Reported on 04/11/2023), Disp: 120 tablet, Rfl: 2   Vital signs in last 24 hrs: Vitals:   04/11/23 0958  BP: 124/70  Pulse: 68   Wt Readings from Last 3 Encounters:  04/11/23 166 lb (75.3 kg)  01/23/23 168 lb (76.2 kg)  03/05/22 177 lb (80.3 kg)    Physical Exam  Well-appearing HEENT: sclera anicteric, oral mucosa moist without lesions Neck: supple, no thyromegaly, JVD or lymphadenopathy Cardiac: Regular without appreciable murmur,  no peripheral edema Pulm: clear to auscultation bilaterally, normal RR and effort noted Abdomen: soft, no tenderness, with active bowel sounds.  Left lobe liver palpable on inspiration Skin; warm and dry, no jaundice or rash Ambulatory, gets on exam table with little assistance, speech fluent, no asterixis  Labs:   ___________________________________________ Radiologic studies:   ____________________________________________ Other:   _____________________________________________   Encounter Diagnoses  Name Primary?   Cirrhosis, non-alcoholic (HCC) Yes   Liver cirrhosis secondary to NASH (HCC)    Secondary esophageal varices without bleeding (HCC)    Gastroesophageal reflux disease, unspecified whether esophagitis present     Assessment and Plan Chronic stable cirrhosis with esophageal varices on primary beta-blocker prophylaxis No ascites or edema, GI bleeding, encephalopathy  Fairly recent onset of reflux symptoms, now under good control taking calcium carbonate antacid at night.  If this controls her symptoms and if she has no medical  contraindication to the calcium ingestion, then it is fine with me for her to continue that.  If she prefers to go to famotidine that she was taking before, that is fine as well.  I do not feel she needs repeat upper endoscopy or upper GI imaging at this point.  Due for lab work to update cirrhosis surveillance and HCC screening.  Plan: CBC,CMP, INR, AFP Abd [place] for HCC screening  See me in 6 months    Charlie Pitter III

## 2023-04-11 NOTE — Patient Instructions (Signed)
  Your provider has requested that you go to the basement level for lab work before leaving today. Press "B" on the elevator. The lab is located at the first door on the left as you exit the elevator.  You have been scheduled for an abdominal ultrasound at [facility] (1st floor of hospital) on 04/15/2023 at 8:00am. Please arrive 30 minutes prior to your appointment for registration. Make certain not to have anything to eat or drink 6 hours prior to your appointment. Should you need to reschedule your appointment, please contact radiology at [phone]. This test typically takes about 30 minutes to perform.  _______________________________________________________  If your blood pressure at your visit was 140/90 or greater, please contact your primary care physician to follow up on this.  _______________________________________________________  If you are age 76 or older, your body mass index should be between 23-30. Your Body mass index is 26.79 kg/m. If this is out of the aforementioned range listed, please consider follow up with your Primary Care Provider.  If you are age 91 or younger, your body mass index should be between 19-25. Your Body mass index is 26.79 kg/m. If this is out of the aformentioned range listed, please consider follow up with your Primary Care Provider.   ________________________________________________________  The [facility] providers would like to encourage you to use [facility] to communicate with providers for non-urgent requests or questions.  Due to long hold times on the telephone, sending your provider a message by [facility] may be a faster and more efficient way to get a response.  Please allow 48 business hours for a response.  Please remember that this is for non-urgent requests.  _______________________________________________________ It was a pleasure to see you today!  Thank you for trusting me with your gastrointestinal care!

## 2023-04-14 LAB — AFP TUMOR MARKER: AFP-Tumor Marker: 1.2 ng/mL

## 2023-04-15 ENCOUNTER — Ambulatory Visit (HOSPITAL_COMMUNITY)
Admission: RE | Admit: 2023-04-15 | Discharge: 2023-04-15 | Disposition: A | Discharge status: Home / Self Care | Payer: Medicare HMO | Source: Ambulatory Visit | Attending: Gastroenterology | Admitting: Gastroenterology

## 2023-04-15 DIAGNOSIS — K7460 Unspecified cirrhosis of liver: Secondary | ICD-10-CM | POA: Insufficient documentation

## 2023-04-18 ENCOUNTER — Encounter: Payer: Self-pay | Admitting: Gastroenterology

## 2023-04-30 ENCOUNTER — Ambulatory Visit
Admission: RE | Admit: 2023-04-30 | Discharge: 2023-04-30 | Disposition: A | Discharge status: Home / Self Care | Payer: Medicare HMO | Source: Ambulatory Visit | Attending: Family Medicine | Admitting: Family Medicine

## 2023-04-30 DIAGNOSIS — Z1231 Encounter for screening mammogram for malignant neoplasm of breast: Secondary | ICD-10-CM

## 2023-06-02 ENCOUNTER — Telehealth: Payer: Self-pay

## 2023-06-02 ENCOUNTER — Ambulatory Visit (INDEPENDENT_AMBULATORY_CARE_PROVIDER_SITE_OTHER)

## 2023-06-02 DIAGNOSIS — M2141 Flat foot [pes planus] (acquired), right foot: Secondary | ICD-10-CM | POA: Diagnosis not present

## 2023-06-02 DIAGNOSIS — E119 Type 2 diabetes mellitus without complications: Secondary | ICD-10-CM | POA: Diagnosis not present

## 2023-06-02 DIAGNOSIS — M2041 Other hammer toe(s) (acquired), right foot: Secondary | ICD-10-CM

## 2023-06-02 DIAGNOSIS — M2011 Hallux valgus (acquired), right foot: Secondary | ICD-10-CM | POA: Diagnosis not present

## 2023-06-02 DIAGNOSIS — M2142 Flat foot [pes planus] (acquired), left foot: Secondary | ICD-10-CM

## 2023-06-02 DIAGNOSIS — M2042 Other hammer toe(s) (acquired), left foot: Secondary | ICD-10-CM | POA: Diagnosis not present

## 2023-06-02 DIAGNOSIS — M2012 Hallux valgus (acquired), left foot: Secondary | ICD-10-CM

## 2023-06-02 NOTE — Telephone Encounter (Signed)
 Medicare ppw for shoes exp. On 4/7 re-faxed to Dr. Clare Critchley today. Cannot dispense shoes until new ppw is received  Britton Cane CPed, CFo, CFm

## 2023-06-03 NOTE — Progress Notes (Signed)

## 2023-06-17 ENCOUNTER — Ambulatory Visit: Payer: Medicare HMO | Admitting: Podiatry

## 2023-07-01 ENCOUNTER — Ambulatory Visit: Payer: Medicare HMO | Admitting: Podiatry

## 2023-07-16 ENCOUNTER — Encounter: Payer: Self-pay | Admitting: Gastroenterology

## 2023-07-17 ENCOUNTER — Encounter: Payer: Self-pay | Admitting: Podiatry

## 2023-07-17 ENCOUNTER — Ambulatory Visit: Admitting: Podiatry

## 2023-07-17 VITALS — Ht 67.0 in | Wt 166.0 lb

## 2023-07-17 DIAGNOSIS — L600 Ingrowing nail: Secondary | ICD-10-CM | POA: Diagnosis not present

## 2023-07-17 NOTE — Patient Instructions (Signed)

## 2023-07-18 NOTE — Progress Notes (Signed)
 Subjective:   Patient ID: Alyssa Haynes, female   DOB: 78 y.o.   MRN: [id number]   HPI Patient presents with a chronic ingrown toenail of her right big toe and states that her sugars been running very well last A1c 7.3 it has been painful and presents with caregiver   ROS      Objective:  Physical Exam  Neurovascular status intact good digital perfusion noted incurvated lateral border of the right big toe moderate pain when pressed localized     Assessment:  Ingrown toenail deformity right hallux lateral border with pain with diabetes under excellent control good circulatory status     Plan:  H&P reviewed due to the intensity of discomfort recommended correction she wants it done permanently explained procedure allowed her to read consent form and she signed and I infiltrated the right big toe 60 mg like Marcaine mixture sterile prep done using sterile instrumentation removed the lateral border exposed matrix applied phenol 3 applications 30 seconds followed by alcohol lavage sterile dressing gave instructions on soaks wear dressing 24 hours take it off earlier if throbbing were to occur and encouraged to call questions concerns which may arise during healing

## 2023-07-24 MED ORDER — CARVEDILOL 3.125 MG PO TABS: 3.1250 mg | ORAL_TABLET | Freq: Two times a day (BID) | ORAL | 5 refills | Status: DC

## 2023-07-24 NOTE — Addendum Note (Signed)
 Addended by: Laurie Poplar on: 07/24/2023 12:55 PM   Modules accepted: Orders

## 2023-07-24 NOTE — Telephone Encounter (Signed)
 She does need to continue a nonselective beta-blocker for her esophageal varices, but practices have changed and now it is felt that a different medicine in that class works better for this indication the nadolol does.  So she needs a new prescription for carvedilol 3.125 mg tablet 1 tablet twice daily Dispense 60, refill 5  And if she does not already have a follow-up clinic visit with me for her cirrhosis, she needs one sometime in August.  H Danis

## 2023-09-16 ENCOUNTER — Ambulatory Visit (INDEPENDENT_AMBULATORY_CARE_PROVIDER_SITE_OTHER): Admitting: Podiatry

## 2023-09-16 ENCOUNTER — Encounter: Payer: Self-pay | Admitting: Podiatry

## 2023-09-16 DIAGNOSIS — M79675 Pain in left toe(s): Secondary | ICD-10-CM

## 2023-09-16 DIAGNOSIS — E119 Type 2 diabetes mellitus without complications: Secondary | ICD-10-CM | POA: Diagnosis not present

## 2023-09-16 DIAGNOSIS — M79674 Pain in right toe(s): Secondary | ICD-10-CM

## 2023-09-16 DIAGNOSIS — B351 Tinea unguium: Secondary | ICD-10-CM | POA: Diagnosis not present

## 2023-09-18 NOTE — Progress Notes (Signed)
  Subjective:  Patient ID: Alyssa Haynes, female    DOB: May 09, 1945,  MRN: [id number]  Zyra D Laubacher presents to clinic today for preventative diabetic foot care and painful thick toenails that are difficult to trim. Pain interferes with ambulation. Aggravating factors include wearing enclosed shoe gear. Pain is relieved with periodic professional debridement.  Chief Complaint  Patient presents with   Dfc    Rm15 Diabetic Foot Care/ Dr. Reese/ A1c 7.6   New problem(s): None.   PCP is Ilah Crigler, MD.  Allergies  Allergen Reactions   Other Itching and Rash    Wool     Review of Systems: Negative except as noted in the HPI.  Objective: No changes noted in today's physical examination. There were no vitals filed for this visit. Alyssa Haynes is a pleasant 78 y.o. female WD, WN in NAD. AAO x 3.  Vascular Examination: Capillary refill time immediate b/l. Palpable pedal pulses. Pedal hair present b/l. Pedal edema absent. No pain with calf compression b/l. Skin temperature gradient WNL b/l. No cyanosis or clubbing b/l. No ischemia or gangrene noted b/l.   Neurological Examination: Sensation grossly intact b/l with 10 gram monofilament. Vibratory sensation intact b/l.   Dermatological Examination: Pedal skin with normal turgor, texture and tone b/l.  No open wounds. No interdigital macerations.   Toenails 1-5 b/l thick, discolored, elongated with subungual debris and pain on dorsal palpation.   No corns, calluses, nor porokeratotic lesions.  Musculoskeletal Examination: Muscle strength 5/5 to all lower extremity muscle groups bilaterally. Hammertoe(s) 2-5 b/l.  Radiographs: None  Assessment/Plan: 1. Pain due to onychomycosis of toenails of both feet   2. Type 2 diabetes mellitus without complication, without long-term current use of insulin  [facility])   Consent given for treatment. Patient examined. All patient's and/or POA's questions/concerns addressed on today's  visit. Toenails 1-5 debrided in length and girth without incident. Continue foot and shoe inspections daily. Monitor blood glucose per PCP/Endocrinologist's recommendations. Continue soft, supportive shoe gear daily. Report any pedal injuries to medical professional. Call office if there are any questions/concerns. -Patient/POA to call should there be question/concern in the interim.   Return in about 3 months (around 12/17/2023).  Delon LITTIE Merlin, DPM      [facility] LOCATION: [address], [place] [address]                   Office [phone]   [facility] LOCATION: [address] [place], [place] [address] Office [phone]

## 2023-09-22 ENCOUNTER — Encounter: Payer: Self-pay | Admitting: Gastroenterology

## 2023-10-01 ENCOUNTER — Ambulatory Visit: Payer: Self-pay | Admitting: Gastroenterology

## 2023-10-01 ENCOUNTER — Encounter: Payer: Self-pay | Admitting: Gastroenterology

## 2023-10-01 ENCOUNTER — Other Ambulatory Visit (INDEPENDENT_AMBULATORY_CARE_PROVIDER_SITE_OTHER)

## 2023-10-01 ENCOUNTER — Ambulatory Visit: Admitting: Gastroenterology

## 2023-10-01 VITALS — BP 112/68 | HR 89 | Ht 67.0 in | Wt 168.0 lb

## 2023-10-01 DIAGNOSIS — K7581 Nonalcoholic steatohepatitis (NASH): Secondary | ICD-10-CM | POA: Diagnosis not present

## 2023-10-01 DIAGNOSIS — I8510 Secondary esophageal varices without bleeding: Secondary | ICD-10-CM | POA: Diagnosis not present

## 2023-10-01 DIAGNOSIS — K7460 Unspecified cirrhosis of liver: Secondary | ICD-10-CM

## 2023-10-01 LAB — CBC WITH DIFFERENTIAL/PLATELET
Basophils Absolute: 0 K/uL (ref 0.0–0.1)
Basophils Relative: 0.7 % (ref 0.0–3.0)
Eosinophils Absolute: 0.1 K/uL (ref 0.0–0.7)
Eosinophils Relative: 2.4 % (ref 0.0–5.0)
HCT: 38.9 % (ref 36.0–46.0)
Hemoglobin: 12.8 g/dL (ref 12.0–15.0)
Lymphocytes Relative: 20 % (ref 12.0–46.0)
Lymphs Abs: 0.8 K/uL (ref 0.7–4.0)
MCHC: 32.8 g/dL (ref 30.0–36.0)
MCV: 91.1 fl (ref 78.0–100.0)
Monocytes Absolute: 0.4 K/uL (ref 0.1–1.0)
Monocytes Relative: 9.8 % (ref 3.0–12.0)
Neutro Abs: 2.7 K/uL (ref 1.4–7.7)
Neutrophils Relative %: 67.1 % (ref 43.0–77.0)
Platelets: 113 K/uL — ABNORMAL LOW (ref 150.0–400.0)
RBC: 4.27 Mil/uL (ref 3.87–5.11)
RDW: 14.1 % (ref 11.5–15.5)
WBC: 4.1 K/uL (ref 4.0–10.5)

## 2023-10-01 LAB — COMPREHENSIVE METABOLIC PANEL WITH GFR
ALT: 38 U/L — ABNORMAL HIGH (ref 0–35)
AST: 42 U/L — ABNORMAL HIGH (ref 0–37)
Albumin: 4.5 g/dL (ref 3.5–5.2)
Alkaline Phosphatase: 65 U/L (ref 39–117)
BUN: 15 mg/dL (ref 6–23)
CO2: 26 meq/L (ref 19–32)
Calcium: 10.3 mg/dL (ref 8.4–10.5)
Chloride: 105 meq/L (ref 96–112)
Creatinine, Ser: 0.7 mg/dL (ref 0.40–1.20)
GFR: 84.22 mL/min (ref >60.00)
Glucose, Bld: 186 mg/dL — ABNORMAL HIGH (ref 70–99)
Potassium: 4.3 meq/L (ref 3.5–5.1)
Sodium: 142 meq/L (ref 135–145)
Total Bilirubin: 0.7 mg/dL (ref 0.2–1.2)
Total Protein: 7.6 g/dL (ref 6.0–8.3)

## 2023-10-01 LAB — PROTIME-INR
INR: 1.2 ratio — ABNORMAL HIGH (ref 0.8–1.0)
Prothrombin Time: 13 s (ref 9.6–13.1)

## 2023-10-01 NOTE — Patient Instructions (Addendum)
 Your provider has requested that you go to the basement level for lab work before leaving today. Press B on the elevator. The lab is located at the first door on the left as you exit the elevator.   You have been scheduled for an abdominal ultrasound at [facility] (1st floor of hospital) on 10/07/23 at 10 am. Please arrive 30 minutes prior to your appointment for registration. Make certain not to have anything to eat or drink after midnight prior to your appointment. Should you need to reschedule your appointment, please contact radiology at [phone]. This test typically takes about 30 minutes to perform.  _______________________________________________________  If your blood pressure at your visit was 140/90 or greater, please contact your primary care physician to follow up on this.  _______________________________________________________  If you are age 57 or older, your body mass index should be between 23-30. Your Body mass index is 26.31 kg/m. If this is out of the aforementioned range listed, please consider follow up with your Primary Care Provider.  If you are age 21 or younger, your body mass index should be between 19-25. Your Body mass index is 26.31 kg/m. If this is out of the aformentioned range listed, please consider follow up with your Primary Care Provider.   ________________________________________________________  The [facility] [facility] providers would like to encourage you to use [facility] to communicate with providers for non-urgent requests or questions.  Due to long hold times on the telephone, sending your provider a message by [facility] may be a faster and more efficient way to get a response.  Please allow 48 business hours for a response.  Please remember that this is for non-urgent requests.  _______________________________________________________  [facility] is using a team-based approach to care.  Your team is made up of your doctor and two to  three APPS. Our APPS (Nurse Practitioners and Physician Assistants) work with your physician to ensure care continuity for you. They are fully qualified to address your health concerns and develop a treatment plan. They communicate directly with your gastroenterologist to care for you. Seeing the Advanced Practice Practitioners on your physician's team can help you by facilitating care more promptly, often allowing for earlier appointments, access to diagnostic testing, procedures, and other specialty referrals.    Thank you for trusting me with your gastrointestinal care!    Dr. Victory Legrand DOUGLAS [facility]

## 2023-10-01 NOTE — Progress Notes (Addendum)
 [facility] Progress Note  Chief Complaint: Cirrhosis  Summary of GI history:  Fatty liver related cirrhosis, first evaluated in our office November 2023 and then upper endoscopy December 2023 revealing grade 3 esophageal varices.  Care coordinated with her PCP to begin nonselective beta-blocker.  See subsequent chart notes that her home losartan  dose was decreased and she was started on nadolol 20 mg daily with instructions to follow-up blood pressure regularly with primary care. She also sent messages regarding nausea and rash occurring around the same time she started pantoprazole , which seemed unlikely side effect from that meds and she was instructed to be evaluated by primary care.  The PPI restarted because of a small NSAID related ulcer on the EGD (H. pylori negative). As of Jan 2024 visit, No ascites/edema, no hepatic encephalopathy, no history of GI bleeding. Compensated disease with grade 3 esophageal varices, now on primary beta-blocker prophylaxis.  Given HAV and HBV vaccine series in 2024/early 2025  Subjective  HPI: Alyssa Haynes follows up after her February 2025 visit for her cirrhosis and esophageal varices  She is with her son today and reports generally feeling well.  She saw primary care because of some intermittent brief twinges of chest pain that were nonexertional, but she has been referred to cardiology and not yet heard about an appointment.  Sometimes she feels lightheaded in the morning and might check her blood pressure in that home machine and thinks it is okay.  I encouraged her to make a 2-week log of blood pressure and pulse checking it twice a day, especially with upcoming cardiology appointment.  She denies dysphagia odynophagia black or bloody stool change in abdominal girth nausea vomiting or change in bowel habit ROS: Cardiovascular:  no chest pain Respiratory: no dyspnea See above  Remainder of systems negative except as above The patient's Past  Medical, Family and Social History were reviewed and are on file in the EMR. Past Medical History:  Diagnosis Date   Anxiety attack 08/22/2017   Arthritis    Asthma    Benign ovarian cyst 07/10/2016   Bronchitis 06/22/2009   Qualifier: Diagnosis of  By: Loletta MD, Vijay     Cataract    Cirrhosis (HCC)    Diabetes mellitus    GERD (gastroesophageal reflux disease)    Hyperlipidemia    Hypertension    Osteoporosis    Thrombocytopenia (HCC) 02/28/2021   Associated with cirrhosis    Past Surgical History:  Procedure Laterality Date   CESAREAN SECTION  1988   CHOLECYSTECTOMY  2006   COLONOSCOPY     8-9- years ago- unsure of MD   FRACTURE SURGERY  1992   lEFT ANKLE orif   plantar fasciitis Right    release   TUBAL LIGATION  1988    Objective:  Med list reviewed  Current Outpatient Medications:    atorvastatin  (LIPITOR) 20 MG tablet, Take 1 tablet by mouth once daily, Disp: 90 tablet, Rfl: 3   Biotin  1 MG CAPS, Take 1 mg by mouth daily., Disp: 30 capsule, Rfl: 0   Blood Glucose Monitoring Suppl (ACCU-CHEK AVIVA PLUS) w/Device KIT, Use to check blood sugar 2 times daily before meals. diag code E11.9. noninsulin dependent, Disp: 1 kit, Rfl: 0   carvedilol  (COREG ) 3.125 MG tablet, Take 1 tablet (3.125 mg total) by mouth in the morning and at bedtime., Disp: 60 tablet, Rfl: 5   Elderberry 575 MG/5ML SYRP, Take 1 drop by mouth daily., Disp: , Rfl:  empagliflozin  (JARDIANCE ) 25 MG TABS tablet, Take 1 tablet (25 mg total) by mouth daily before breakfast., Disp: 21 tablet, Rfl: 0   ferrous sulfate  325 (65 FE) MG tablet, Take 1 tablet (325 mg total) by mouth at bedtime., Disp: 90 tablet, Rfl: 3   FLOVENT  HFA 44 MCG/ACT inhaler, INHALE 1 PUFF BY MOUTH TWICE DAILY, Disp: 11 g, Rfl: 0   glucose blood (ACCU-CHEK AVIVA PLUS) test strip, Use to check blood sugar 2 times daily. diag code E11.9. noninsulin dependent, Disp: 200 each, Rfl: 1   metFORMIN  (GLUCOPHAGE ) 1000 MG tablet, Take 1 tablet  (1,000 mg total) by mouth 2 (two) times daily with a meal., Disp: 120 tablet, Rfl: 2   Multiple Vitamin (MULTIVITAMIN) LIQD, Take 5 mLs by mouth daily., Disp: , Rfl:    olmesartan  (BENICAR ) 5 MG tablet, Take 5 mg by mouth daily., Disp: , Rfl:    Magnesium 400 MG CAPS, Take by mouth daily., Disp: , Rfl:    metFORMIN  (GLUCOPHAGE ) 500 MG tablet, Take 500 mg by mouth 2 (two) times daily with a meal., Disp: , Rfl:    Vital signs in last 24 hrs: Vitals:   10/01/23 1004  BP: 112/68  Pulse: 89   Wt Readings from Last 3 Encounters:  10/01/23 168 lb (76.2 kg)  07/17/23 166 lb (75.3 kg)  04/11/23 166 lb (75.3 kg)    Physical Exam When I took her radial pulse, it was about 70 Well-appearing HEENT: sclera anicteric, oral mucosa moist without lesions Neck: supple, no thyromegaly, JVD or lymphadenopathy Cardiac: Regular without appreciable murmur,  no peripheral edema Pulm: clear to auscultation bilaterally, normal RR and effort noted Abdomen: soft, no tenderness, with active bowel sounds. No guarding or palpable hepatosplenomegaly. Skin; warm and dry, no jaundice or rash Spleen tip not palpable  Labs:     Latest Ref Rng & Units 04/11/2023   10:48 AM 12/25/2021   10:51 AM 03/01/2021    9:55 AM  CBC  WBC 4.0 - 10.5 K/uL 3.7  3.2  4.2   Hemoglobin 12.0 - 15.0 g/dL 87.0  88.0  86.8   Hematocrit 36.0 - 46.0 % 39.3  36.1  40.4   Platelets 150.0 - 400.0 K/uL 131.0  127.0  140       Latest Ref Rng & Units 04/11/2023   10:48 AM 12/25/2021   10:51 AM 07/05/2021    9:56 AM  CMP  Glucose 70 - 99 mg/dL 815  799    BUN 6 - 23 mg/dL 11  11    Creatinine 9.59 - 1.20 mg/dL 9.30  9.29    Sodium 864 - 145 mEq/L 141  140    Potassium 3.5 - 5.1 mEq/L 3.6  3.8    Chloride 96 - 112 mEq/L 104  105    CO2 19 - 32 mEq/L 26  26    Calcium  8.4 - 10.5 mg/dL 9.7  9.8    Total Protein 6.0 - 8.3 g/dL 7.3  7.7  7.4   Total Bilirubin 0.2 - 1.2 mg/dL 0.8  0.7  0.6   Alkaline Phos 39 - 117 U/L 57  94  118   AST 0  - 37 U/L 37  34  49   ALT 0 - 35 U/L 26  32  41    Lab Results  Component Value Date   INR 1.3 (H) 04/11/2023   INR 1.2 (H) 12/25/2021   INR 1.1 10/27/2019   AFP 1.2 in February 2025 ___________________________________________  Radiologic studies:  CLINICAL DATA:  78 year old female with cirrhosis. History of intrahepatic biliary dilatation. Prior cholecystectomy.   EXAM: ABDOMEN ULTRASOUND COMPLETE   COMPARISON:  01/28/2022 MR. Prior CT and ultrasounds   FINDINGS: Gallbladder: Not visualized compatible with cholecystectomy.   Common bile duct: Diameter: 8 mm. No evidence of extrahepatic biliary dilatation. RIGHT intrahepatic biliary fullness appears less prominent when compared to prior MR.   Liver: Nodular hepatic contour and coarse heterogeneous hepatic echotexture is compatible with cirrhosis. No definite focal abnormalities are noted. Portal vein is patent on color Doppler imaging with normal direction of blood flow towards the liver.   IVC: No abnormality visualized.   Pancreas: Visualized portion unremarkable.   Spleen: Size and appearance within normal limits.   Right Kidney: Length: 10.2 cm. Echogenicity within normal limits. No mass or hydronephrosis visualized.   Left Kidney: Length: 10.5 cm. Echogenicity within normal limits. No mass or hydronephrosis visualized.   Abdominal aorta: No aneurysm visualized.   Other findings: None.   IMPRESSION: 1. Cirrhosis without sonographic evidence of focal hepatic abnormality. RIGHT hepatic biliary fullness appears less prominent when compared to 01/28/2022 MR. 2. Status post cholecystectomy. 3. No other significant abnormalities.     Electronically Signed   By: Reyes Phi M.D.   On: 04/15/2023 12:20   ____________________________________________ Other:   _____________________________________________ Assessment & Plan  Assessment: Encounter Diagnoses  Name Primary?   Liver cirrhosis secondary to  [facility]) Yes   Secondary esophageal varices without bleeding (HCC)    From the standpoint of her cirrhosis, Alyssa Haynes appears stable overall since I last saw her, and has not developed any physical exam findings (peripheral edema, increased abdominal girth, prominent abdominal wall vasculature) to suggest progression of portal hypertension.  We again discussed the need for long-term regular follow-up with lab work and periodic liver cancer screening.  Resting pulse in the 60s would be ideal for variceal prophylaxis with nonselective beta-blocker, but her blood pressure may not tolerate an increased dose, particularly with her reporting some intermittent morning dizziness.  Blood pressure log that she brings cardiology will be helpful for them. The chest pain is she described it is not clearly angina.  Plan: Labs today: CBC, CMP, PT/INR, AFP Right upper quadrant ultrasound scheduled for HCC screening Recommended she get annual flu and COVID-vaccine available in the fall Low carbohydrate diet since the underlying cause this is suspected to have been fatty liver. See me in 6 months or sooner if needed  30 minutes were spent on this encounter (including chart review, history/exam, counseling/coordination of care, and documentation) > 50% of that time was spent on counseling and coordination of care.   Victory LITTIE Brand III

## 2023-10-03 LAB — AFP TUMOR MARKER: AFP-Tumor Marker: 1.2 ng/mL

## 2023-10-07 ENCOUNTER — Ambulatory Visit (HOSPITAL_COMMUNITY)
Admission: RE | Admit: 2023-10-07 | Discharge: 2023-10-07 | Disposition: A | Discharge status: Home / Self Care | Source: Ambulatory Visit | Attending: Gastroenterology | Admitting: Gastroenterology

## 2023-10-07 DIAGNOSIS — K7581 Nonalcoholic steatohepatitis (NASH): Secondary | ICD-10-CM | POA: Diagnosis present

## 2023-10-07 DIAGNOSIS — K7460 Unspecified cirrhosis of liver: Secondary | ICD-10-CM | POA: Diagnosis present

## 2023-10-21 ENCOUNTER — Encounter: Payer: Self-pay | Admitting: Gastroenterology

## 2023-10-22 ENCOUNTER — Ambulatory Visit: Attending: Cardiovascular Disease | Admitting: Internal Medicine

## 2023-10-22 VITALS — BP 126/76 | HR 62 | Ht 66.0 in | Wt 169.0 lb

## 2023-10-22 DIAGNOSIS — I10 Essential (primary) hypertension: Secondary | ICD-10-CM

## 2023-10-22 DIAGNOSIS — R079 Chest pain, unspecified: Secondary | ICD-10-CM | POA: Diagnosis not present

## 2023-10-22 DIAGNOSIS — R072 Precordial pain: Secondary | ICD-10-CM | POA: Diagnosis not present

## 2023-10-22 DIAGNOSIS — R42 Dizziness and giddiness: Secondary | ICD-10-CM | POA: Diagnosis not present

## 2023-10-22 MED ORDER — METOPROLOL TARTRATE 25 MG PO TABS: ORAL_TABLET | ORAL | 0 refills | Status: AC

## 2023-10-22 NOTE — Patient Instructions (Addendum)
 Medication Instructions:  Your physician recommends that you continue on your current medications as directed. Please refer to the Current Medication list given to you today.  *If you need a refill on your cardiac medications before your next appointment, please call your pharmacy*  Lab Work: NONE  If you have labs (blood work) drawn today and your tests are completely normal, you will receive your results only by: MyChart Message (if you have MyChart) OR A paper copy in the mail If you have any lab test that is abnormal or we need to change your treatment, we will call you to review the results.  Testing/Procedures: Your physician has requested that you have an echocardiogram. Echocardiography is a painless test that uses sound waves to create images of your heart. It provides your doctor with information about the size and shape of your heart and how well your heart's chambers and valves are working. This procedure takes approximately one hour. There are no restrictions for this procedure. Please do NOT wear cologne, perfume, aftershave, or lotions (deodorant is allowed). Please arrive 15 minutes prior to your appointment time.  Please note: We ask at that you not bring children with you during ultrasound (echo/ vascular) testing. Due to room size and safety concerns, children are not allowed in the ultrasound rooms during exams. Our front office staff cannot provide observation of children in our lobby area while testing is being conducted. An adult accompanying a patient to their appointment will only be allowed in the ultrasound room at the discretion of the ultrasound technician under special circumstances. We apologize for any inconvenience.  Your physician has requested that you have cardiac CT. Cardiac computed tomography (CT) is a painless test that uses an x-ray machine to take clear, detailed pictures of your heart. For further information please visit [email or web address] Please  follow instruction sheet as given.    Follow-Up: At [facility], you and your health needs are our priority.  As part of our continuing mission to provide you with exceptional heart care, our providers are all part of one team.  This team includes your primary Cardiologist (physician) and Advanced Practice Providers or APPs (Physician Assistants and Nurse Practitioners) who all work together to provide you with the care you need, when you need it.  Your next appointment:   4 month(s)  Provider:  Stanly Leavens, MD, Lum Louis, NP, Jackee Alberts, NP or Orren Fabry, [place]       Other Instructions  Your cardiac CT will be scheduled at one of the below locations:    [facility] [address] [place], [place] [address] [phone]  OR   [facility] [address] [place], [place] [address] [phone]  OR   [facility] [address]  [place], [place] [address] [phone]  OR   [facility] [address] [place], [place] [phone]  If scheduled at [facility], please arrive at the [facility] and Children's Entrance (Entrance C2) of [facility] 30 minutes prior to test start time. You can use the FREE valet parking offered at entrance C (encouraged to control the heart rate for the test)  Proceed to the [facility] Radiology Department (first floor) to check-in and test prep.  All radiology patients and guests should use entrance C2 at [facility], accessed from [facility], even though the hospital's physical address listed is [address]  If scheduled  at the Heart and Vascular Tower at [address], please enter the parking lot using the [address] entrance and use the FREE valet service at the patient drop-off area. Enter the building and check-in with registration on the main floor.  If scheduled at [facility], please arrive to the Heart and Vascular Center 15 mins early for check-in and test prep.  There is spacious parking and easy access to the radiology department from the [facility] Heart and Vascular entrance. Please enter here and check-in with the desk attendant.   If scheduled at [facility], please arrive 30 minutes early for check-in and test prep.  Please follow these instructions carefully (unless otherwise directed):  An IV will be required for this test and Nitroglycerin will be given.  Hold all erectile dysfunction medications at least 3 days (72 hrs) prior to test. (Ie viagra, cialis, sildenafil, tadalafil, etc)   On the Night Before the Test: Be sure to Drink plenty of water. Do not consume any caffeinated/decaffeinated beverages or chocolate 12 hours prior to your test. Do not take any antihistamines 12 hours prior to your test.   On the Day of the Test: Drink plenty of water until 1 hour prior to the test. Do not eat any food 1 hour prior to test. You may take your regular medications prior to the test.  Take metoprolol  (Lopressor ) 25 mg by mouth two hours prior to test. If you take Furosemide/Hydrochlorothiazide /Spironolactone/Chlorthalidone, please HOLD on the morning of the test. Patients who wear a continuous glucose monitor MUST remove the device prior to scanning. FEMALES- please wear underwire-free bra if available, avoid dresses & tight clothing         After the Test: Drink plenty of water. After receiving IV contrast, you may experience a mild flushed feeling. This is normal. On occasion, you may experience a mild rash up to 24 hours after the test. This is not dangerous. If this occurs, you can take Benadryl  25 mg, Zyrtec , Claritin , or Allegra and increase your fluid intake. (Patients taking Tikosyn should avoid Benadryl , and may take Zyrtec , Claritin , or Allegra) If you experience trouble breathing, this can be serious. If it is severe call  911 IMMEDIATELY. If it is mild, please call our office.  We will call to schedule your test 2-4 weeks out understanding that some insurance companies will need an authorization prior to the service being performed.   For more information and frequently asked questions, please visit our website : [email or web address]  For non-scheduling related questions, please contact the cardiac imaging nurse navigator should you have any questions/concerns: Cardiac Imaging Nurse Navigators Direct Office Dial: [phone]   For scheduling needs, including cancellations and rescheduling, please call [place], [phone].

## 2023-10-22 NOTE — Progress Notes (Signed)
 Cardiology Office Note:  .    Date:  10/22/2023  ID:  Alyssa Haynes, DOB 10/06/1945, MRN [id number] PCP: Alyssa Crigler, MD  [facility] Providers Cardiologist:  Alyssa DELENA Leavens, MD     CC: Chest Pain Consulted for the evaluation of chest pain at the behest of Dr. Ilah   History of Present Illness: .    Alyssa Haynes is a 78 y.o. female withwith hypertension and type 2 diabetes who presents with chest pain. She is accompanied by her daughter, Alyssa Haynes. She was referred by Dr. 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  She experiences intermittent chest pain described as 'a little pinch' that does not last long. Occasionally, she feels a heaviness when lying on one side, which she suspects might be related to gas. No chest pain, breathing issues, dizziness, or feeling like passing out during physical activities such as walking to the office or grocery shopping.  She frequently feels tired, a symptom she has experienced before. She experiences occasional dizziness and lightheadedness, particularly when taking a shower. She attributes some of these symptoms to her medications, including meloxicam for arthritis and a nighttime sleep aid, which she has since stopped due to feeling woozy in the mornings.  She takes several supplements, including apple cider vinegar, biotin , and a super green powder. She has a history of cold sensations in her legs, which her primary care physician suggested might be neuropathy, leading to a prescription for gabapentin, which she stopped due to concerns about side effects.  Her glucose level was 186 on October 01, 2023. She has elevated AST and ALT levels, stable kidney function, and thrombocytopenia with a platelet count of 113,000. Abdominal imaging from January 08, 2022, was performed as part of her workup.  Discussed the use of AI scribe software for clinical note  transcription with the patient, who gave verbal consent to proceed.   Relevant histories: .  Social  - from [place], [place] with Daughter, strong interest in OTC supplements ROS: As per HPI.   Studies Reviewed: SABRA    LABS Glucose: 186 (10/01/2023) Platelets (PLT): 113  RADIOLOGY CT Abdomen Pelvis with contrast: Aortic valve calcification, aortic atherosclerosis, peripheral arterial disease (01/08/2022)  Physical Exam:    VS:  BP 126/76   Pulse 62   Ht 5' 6 (1.676 m)   Wt 169 lb (76.7 kg)   SpO2 96%   BMI 27.28 kg/m    Wt Readings from Last 3 Encounters:  10/22/23 169 lb (76.7 kg)  10/01/23 168 lb (76.2 kg)  07/17/23 166 lb (75.3 kg)    Gen: no distress   Neck: No JVD Cardiac: No Rubs or Gallops, systolic Murmur, RRR +2 radial pulses Respiratory: Clear to auscultation bilaterally, normal effort, normal  respiratory rate GI: Soft, nontender, non-distended  MS: No  edema;  moves all extremities Integument: Skin feels warm Neuro:  At time of evaluation, alert and oriented to person/place/time/situation  Psych: Normal affect, patient feels ok   ASSESSMENT AND PLAN: .    An EKG was ordered for chest pain and shows septal infarct pattern (borderline)  Aortic valve calcification with heart murmur Presence of aortic valve calcification and heart murmur, likely contributing to symptoms of dizziness and exercise intolerance. - Order echocardiogram to evaluate aortic valve function.  Aortic atherosclerosis and peripheral arterial disease without claudication Aortic atherosclerosis and peripheral arterial disease noted on imaging. No symptoms of claudication reported. Current  symptoms of coldness in legs could be related to neuropathy rather than vascular issues.  Evaluation of atypical chest pain and dizziness Atypical chest pain and dizziness reported. Chest pain is not classic for coronary artery disease. Previous non-cardiac CT did not show coronary artery disease. Cardiac CT  recommended to rule out obstructive coronary disease, especially given the use of meloxicam, which is not recommended for obstructive coronary disease. - Order cardiac CT to evaluate for coronary artery disease. - suspect will need ASA vs Plavix, then secondary prevention with LDL goal of at least 70  Hypertension, type 2 diabetes mellitus, and hyperlipidemia (metabolic syndrome) Metabolic syndrome with components of hypertension, type 2 diabetes, and hyperlipidemia, increasing the risk of coronary artery disease. Current management appears to be ongoing with primary care.  F/u with my team in 4 months unless high risk findings   Alyssa Leavens, MD FASE [facility] Cardiologist [facility]  [address] [place], [address] [place], [place] [address] [phone]  3:30 PM

## 2023-10-23 ENCOUNTER — Encounter: Payer: Self-pay | Admitting: Internal Medicine

## 2023-10-24 ENCOUNTER — Encounter (HOSPITAL_COMMUNITY): Payer: Self-pay

## 2023-10-28 ENCOUNTER — Ambulatory Visit (HOSPITAL_COMMUNITY): Admission: RE | Admit: 2023-10-28 | Source: Ambulatory Visit

## 2023-11-24 ENCOUNTER — Ambulatory Visit (HOSPITAL_COMMUNITY)
Admission: RE | Admit: 2023-11-24 | Discharge: 2023-11-24 | Disposition: A | Discharge status: Home / Self Care | Source: Ambulatory Visit | Attending: Internal Medicine | Admitting: Internal Medicine

## 2023-11-24 DIAGNOSIS — R42 Dizziness and giddiness: Secondary | ICD-10-CM | POA: Insufficient documentation

## 2023-11-24 DIAGNOSIS — R072 Precordial pain: Secondary | ICD-10-CM | POA: Insufficient documentation

## 2023-11-24 DIAGNOSIS — I10 Essential (primary) hypertension: Secondary | ICD-10-CM | POA: Insufficient documentation

## 2023-11-24 LAB — ECHOCARDIOGRAM COMPLETE
Area-P 1/2: 3.02 cm2
S' Lateral: 2.6 cm

## 2023-11-26 ENCOUNTER — Ambulatory Visit: Payer: Self-pay | Admitting: Internal Medicine

## 2023-12-01 ENCOUNTER — Encounter (HOSPITAL_COMMUNITY): Payer: Self-pay

## 2023-12-01 ENCOUNTER — Ambulatory Visit (HOSPITAL_COMMUNITY)

## 2023-12-30 ENCOUNTER — Ambulatory Visit: Admitting: Podiatry

## 2024-01-05 ENCOUNTER — Other Ambulatory Visit: Payer: Self-pay | Admitting: Gastroenterology

## 2024-01-07 ENCOUNTER — Ambulatory Visit (INDEPENDENT_AMBULATORY_CARE_PROVIDER_SITE_OTHER): Admitting: Podiatry

## 2024-01-07 DIAGNOSIS — Z91199 Patient's noncompliance with other medical treatment and regimen due to unspecified reason: Secondary | ICD-10-CM

## 2024-01-08 NOTE — Progress Notes (Signed)
 1. No-show for appointment

## 2024-01-22 ENCOUNTER — Encounter (HOSPITAL_COMMUNITY): Payer: Self-pay

## 2024-02-17 ENCOUNTER — Ambulatory Visit: Admitting: Internal Medicine

## 2024-05-04 ENCOUNTER — Ambulatory Visit: Admitting: Internal Medicine
